# Patient Record
Sex: Female | Born: 1950 | Race: White | Hispanic: No | Marital: Single | Smoking: Never smoker
Health system: Southern US, Community
[De-identification: ages and names within clinical notes are randomized; demographics above are authoritative.]

## PROBLEM LIST (undated history)

## (undated) DIAGNOSIS — F0280 Dementia in other diseases classified elsewhere without behavioral disturbance: Secondary | ICD-10-CM

## (undated) DIAGNOSIS — D649 Anemia, unspecified: Secondary | ICD-10-CM

## (undated) DIAGNOSIS — E7800 Pure hypercholesterolemia, unspecified: Secondary | ICD-10-CM

## (undated) DIAGNOSIS — G5603 Carpal tunnel syndrome, bilateral upper limbs: Secondary | ICD-10-CM

## (undated) DIAGNOSIS — F329 Major depressive disorder, single episode, unspecified: Secondary | ICD-10-CM

## (undated) DIAGNOSIS — K760 Fatty (change of) liver, not elsewhere classified: Secondary | ICD-10-CM

## (undated) DIAGNOSIS — F32A Depression, unspecified: Secondary | ICD-10-CM

## (undated) DIAGNOSIS — K219 Gastro-esophageal reflux disease without esophagitis: Secondary | ICD-10-CM

## (undated) DIAGNOSIS — F209 Schizophrenia, unspecified: Secondary | ICD-10-CM

## (undated) DIAGNOSIS — G309 Alzheimer's disease, unspecified: Secondary | ICD-10-CM

## (undated) DIAGNOSIS — Z8719 Personal history of other diseases of the digestive system: Secondary | ICD-10-CM

## (undated) HISTORY — PX: MANDIBLE SURGERY: SHX707

## (undated) HISTORY — PX: TUMOR REMOVAL: SHX12

## (undated) HISTORY — PX: TONSILLECTOMY: SUR1361

## (undated) HISTORY — PX: CARPAL TUNNEL RELEASE: SHX101

## (undated) HISTORY — PX: COLONOSCOPY: SHX174

## (undated) HISTORY — PX: ABDOMINAL HYSTERECTOMY: SHX81

## (undated) NOTE — *Deleted (*Deleted)
ED CM met with patient at [facility] bed -20 to discuss transitional care recommendations. HH with DME w/c and bedside commode. Patient states her daughter-in-law will be able to come to her home and care for her declined home health services, but agrees to the equipment. CM ordered equipment will be delivered to the ED and patient will be discharged and transported home with son in private vehicle

---

## 2009-07-05 ENCOUNTER — Emergency Department (HOSPITAL_COMMUNITY): Admission: EM | Admit: 2009-07-05 | Discharge: 2009-07-05 | Payer: Self-pay | Admitting: Emergency Medicine

## 2010-01-21 ENCOUNTER — Inpatient Hospital Stay (HOSPITAL_COMMUNITY): Admission: EM | Admit: 2010-01-21 | Discharge: 2010-01-23 | Payer: Self-pay | Admitting: Emergency Medicine

## 2011-02-02 LAB — COMPREHENSIVE METABOLIC PANEL
AST: 16 U/L (ref 0–37)
AST: 17 U/L (ref 0–37)
Albumin: 2.9 g/dL — ABNORMAL LOW (ref 3.5–5.2)
Alkaline Phosphatase: 88 U/L (ref 39–117)
BUN: 10 mg/dL (ref 6–23)
BUN: 11 mg/dL (ref 6–23)
CO2: 23 mEq/L (ref 19–32)
Chloride: 103 mEq/L (ref 96–112)
Chloride: 107 mEq/L (ref 96–112)
Creatinine, Ser: 0.69 mg/dL (ref 0.4–1.2)
Creatinine, Ser: 0.9 mg/dL (ref 0.4–1.2)
GFR calc non Af Amer: >60 mL/min (ref >60)
Glucose, Bld: 160 mg/dL — ABNORMAL HIGH (ref 70–99)
Potassium: 3.5 mEq/L (ref 3.5–5.1)
Total Bilirubin: 1 mg/dL (ref 0.3–1.2)
Total Protein: 5.3 g/dL — ABNORMAL LOW (ref 6.0–8.3)
Total Protein: 7 g/dL (ref 6.0–8.3)

## 2011-02-02 LAB — CBC
HCT: 34.8 % — ABNORMAL LOW (ref 36.0–46.0)
HCT: 43 % (ref 36.0–46.0)
Hemoglobin: 11.3 g/dL — ABNORMAL LOW (ref 12.0–15.0)
MCHC: 33.2 g/dL (ref 30.0–36.0)
MCHC: 34 g/dL (ref 30.0–36.0)
MCHC: 34.1 g/dL (ref 30.0–36.0)
MCV: 92.2 fL (ref 78.0–100.0)
MCV: 92.3 fL (ref 78.0–100.0)
MCV: 94.1 fL (ref 78.0–100.0)
Platelets: 238 10*3/uL (ref 150–400)
RBC: 3.7 MIL/uL — ABNORMAL LOW (ref 3.87–5.11)
RBC: 3.76 MIL/uL — ABNORMAL LOW (ref 3.87–5.11)
RBC: 3.96 MIL/uL (ref 3.87–5.11)
RDW: 11.7 % (ref 11.5–15.5)
RDW: 12.7 % (ref 11.5–15.5)
WBC: 5.1 10*3/uL (ref 4.0–10.5)
WBC: 6.1 10*3/uL (ref 4.0–10.5)

## 2011-02-02 LAB — BASIC METABOLIC PANEL
BUN: 6 mg/dL (ref 6–23)
BUN: 7 mg/dL (ref 6–23)
Calcium: 8.6 mg/dL (ref 8.4–10.5)
Chloride: 109 mEq/L (ref 96–112)
Chloride: 112 mEq/L (ref 96–112)
Creatinine, Ser: 0.66 mg/dL (ref 0.4–1.2)
GFR calc Af Amer: >60 mL/min (ref >60)
GFR calc non Af Amer: >60 mL/min (ref >60)
Potassium: 3.8 mEq/L (ref 3.5–5.1)
Sodium: 141 mEq/L (ref 135–145)

## 2011-02-02 LAB — BLOOD GAS, ARTERIAL
Acid-Base Excess: 0.4 mmol/L (ref 0.0–2.0)
Bicarbonate: 24.5 mEq/L — ABNORMAL HIGH (ref 20.0–24.0)
O2 Saturation: 92.7 %
Patient temperature: 98.6
pH, Arterial: 7.407 — ABNORMAL HIGH (ref 7.350–7.400)
pO2, Arterial: 61.6 mmHg — ABNORMAL LOW (ref 80.0–100.0)

## 2011-02-02 LAB — DIFFERENTIAL
Basophils Absolute: 0 10*3/uL (ref 0.0–0.1)
Eosinophils Absolute: 0 10*3/uL (ref 0.0–0.7)
Eosinophils Absolute: 0 10*3/uL (ref 0.0–0.7)
Eosinophils Absolute: 0.1 10*3/uL (ref 0.0–0.7)
Eosinophils Relative: 0 % (ref 0–5)
Eosinophils Relative: 1 % (ref 0–5)
Eosinophils Relative: 1 % (ref 0–5)
Lymphocytes Relative: 16 % (ref 12–46)
Lymphocytes Relative: 20 % (ref 12–46)
Lymphs Abs: 0.7 10*3/uL (ref 0.7–4.0)
Lymphs Abs: 1.2 10*3/uL (ref 0.7–4.0)
Monocytes Absolute: 0.5 10*3/uL (ref 0.1–1.0)
Monocytes Relative: 4 % (ref 3–12)
Monocytes Relative: 7 % (ref 3–12)
Monocytes Relative: 9 % (ref 3–12)
Neutro Abs: 5.3 10*3/uL (ref 1.7–7.7)

## 2011-02-02 LAB — CULTURE, BLOOD (ROUTINE X 2): Culture: NO GROWTH

## 2011-02-02 LAB — CARDIAC PANEL(CRET KIN+CKTOT+MB+TROPI)
CK, MB: 0.8 ng/mL (ref 0.3–4.0)
Relative Index: INVALID (ref 0.0–2.5)
Total CK: 30 U/L (ref 7–177)
Troponin I: 0.04 ng/mL (ref 0.00–0.06)

## 2011-02-02 LAB — POCT I-STAT, CHEM 8
BUN: 12 mg/dL (ref 6–23)
Chloride: 102 mEq/L (ref 96–112)
Creatinine, Ser: 0.8 mg/dL (ref 0.4–1.2)
Potassium: 3.6 mEq/L (ref 3.5–5.1)
Sodium: 139 mEq/L (ref 135–145)
TCO2: 28 mmol/L (ref 0–100)

## 2011-02-02 LAB — URINALYSIS, ROUTINE W REFLEX MICROSCOPIC
Glucose, UA: NEGATIVE mg/dL
Hgb urine dipstick: NEGATIVE
Ketones, ur: NEGATIVE mg/dL
Protein, ur: NEGATIVE mg/dL
pH: 5 (ref 5.0–8.0)

## 2011-02-02 LAB — URINE CULTURE

## 2011-02-02 LAB — LIPID PANEL
HDL: 41 mg/dL (ref >39)
LDL Cholesterol: 73 mg/dL (ref 0–99)
Triglycerides: 96 mg/dL (ref <150)
VLDL: 19 mg/dL (ref 0–40)

## 2011-02-02 LAB — SEDIMENTATION RATE: Sed Rate: 39 mm/hr — ABNORMAL HIGH (ref 0–22)

## 2011-02-02 LAB — LACTIC ACID, PLASMA: Lactic Acid, Venous: 2 mmol/L (ref 0.5–2.2)

## 2011-02-02 LAB — RPR: RPR Ser Ql: NONREACTIVE

## 2011-02-02 LAB — TSH: TSH: 0.509 u[IU]/mL (ref 0.350–4.500)

## 2011-02-28 ENCOUNTER — Emergency Department (HOSPITAL_COMMUNITY)
Admission: EM | Admit: 2011-02-28 | Discharge: 2011-03-01 | Disposition: A | Discharge status: Home / Self Care | Payer: PRIVATE HEALTH INSURANCE | Attending: Emergency Medicine | Admitting: Emergency Medicine

## 2011-02-28 DIAGNOSIS — F068 Other specified mental disorders due to known physiological condition: Secondary | ICD-10-CM | POA: Insufficient documentation

## 2011-02-28 DIAGNOSIS — R6884 Jaw pain: Secondary | ICD-10-CM | POA: Insufficient documentation

## 2011-03-01 ENCOUNTER — Emergency Department (HOSPITAL_COMMUNITY): Payer: PRIVATE HEALTH INSURANCE

## 2011-03-10 ENCOUNTER — Other Ambulatory Visit: Payer: Self-pay | Admitting: Oral Surgery

## 2011-03-10 DIAGNOSIS — R6884 Jaw pain: Secondary | ICD-10-CM

## 2011-03-11 ENCOUNTER — Ambulatory Visit
Admission: RE | Admit: 2011-03-11 | Discharge: 2011-03-11 | Disposition: A | Discharge status: Home / Self Care | Payer: PRIVATE HEALTH INSURANCE | Source: Ambulatory Visit | Attending: Oral Surgery | Admitting: Oral Surgery

## 2011-03-11 DIAGNOSIS — R6884 Jaw pain: Secondary | ICD-10-CM

## 2011-06-03 ENCOUNTER — Other Ambulatory Visit (HOSPITAL_COMMUNITY): Payer: PRIVATE HEALTH INSURANCE

## 2011-06-04 ENCOUNTER — Encounter (HOSPITAL_COMMUNITY)
Admission: RE | Admit: 2011-06-04 | Discharge: 2011-06-04 | Disposition: A | Discharge status: Home / Self Care | Payer: PRIVATE HEALTH INSURANCE | Source: Ambulatory Visit | Attending: Oral Surgery | Admitting: Oral Surgery

## 2011-06-04 LAB — CBC
Hemoglobin: 14.9 g/dL (ref 12.0–15.0)
Platelets: 205 10*3/uL (ref 150–400)
RBC: 4.79 MIL/uL (ref 3.87–5.11)
WBC: 5 10*3/uL (ref 4.0–10.5)

## 2011-06-04 LAB — DIFFERENTIAL
Basophils Relative: 0 % (ref 0–1)
Eosinophils Absolute: 0.1 10*3/uL (ref 0.0–0.7)
Lymphs Abs: 1.9 10*3/uL (ref 0.7–4.0)
Monocytes Relative: 9 % (ref 3–12)
Neutro Abs: 2.6 10*3/uL (ref 1.7–7.7)
Neutrophils Relative %: 51 % (ref 43–77)

## 2011-06-04 LAB — SURGICAL PCR SCREEN: MRSA, PCR: POSITIVE — AB

## 2011-06-05 LAB — BASIC METABOLIC PANEL
Chloride: 106 mEq/L (ref 96–112)
GFR calc Af Amer: >60 mL/min (ref >60)
GFR calc non Af Amer: >60 mL/min (ref >60)
Potassium: 4.4 mEq/L (ref 3.5–5.1)
Sodium: 141 mEq/L (ref 135–145)

## 2011-06-09 ENCOUNTER — Ambulatory Visit (HOSPITAL_COMMUNITY)
Admission: RE | Admit: 2011-06-09 | Discharge: 2011-06-09 | Disposition: A | Discharge status: Home / Self Care | Payer: PRIVATE HEALTH INSURANCE | Source: Ambulatory Visit | Attending: Oral Surgery | Admitting: Oral Surgery

## 2011-06-09 DIAGNOSIS — Z79899 Other long term (current) drug therapy: Secondary | ICD-10-CM | POA: Insufficient documentation

## 2011-06-09 DIAGNOSIS — Z01812 Encounter for preprocedural laboratory examination: Secondary | ICD-10-CM | POA: Insufficient documentation

## 2011-06-09 DIAGNOSIS — M26639 Articular disc disorder of temporomandibular joint, unspecified side: Secondary | ICD-10-CM | POA: Insufficient documentation

## 2011-06-10 NOTE — Op Note (Signed)
[name], [name] NO.:  [id number]  MEDICAL RECORD NO.:  [id number]  LOCATION:  [facility]                         FACILITY:  [facility]  PHYSICIAN:  Georgia Lopes, M.D.  DATE OF BIRTH:  Aug 25, 1951  DATE OF PROCEDURE:  06/09/2011 DATE OF DISCHARGE:                              OPERATIVE REPORT   PREOPERATIVE DIAGNOSES: 1. Right temporomandibular joint degenerative joint disease. 2. Anterior disk displacement.  POSTOPERATIVE DIAGNOSES: 1. Right temporomandibular joint degenerative joint disease. 2. Anterior disk displacement.  PROCEDURE:  Right TMJ arthrotomy, meniscectomy, arthroplasty.  SURGEON:  Georgia Lopes, M.D.  ANESTHESIA:  General, Dr. Noreene Larsson attending, nasal intubation.  ASSISTANTS:  Nixon and Simlar.  INDICATIONS FOR PROCEDURE:  The patient is a 60 year old female who is known to my practice for many years, who develop TMJ pain on the right side approximately February of this year.  She was treated conservatively with anti-inflammatory, soft diet, ice and warm compresses, but did not have any relief.  An MRI was obtained which demonstrated severe arthropathy of the right TMJ with the degenerated meniscus.  Because of the continuing pain symptoms and MRI-defined pathology, it was recommended that meniscectomy and arthroplasty be performed with general anesthesia.  PROCEDURE IN DETAIL:  The patient was taken to the operating room, placed on the table in supine position.  General anesthesia was administered intravenously and a nasal endotracheal tube was placed. The tube was secured, the eyes were protected.  The table was turned, then the temporal area was shaved, and then the patient was prepped and draped for the procedure.  Sterile marking pen was used to demarcate the line of the preauricular incision anterior to the tragus and curving anteriorly at the superiormost side.  Then, local anesthesia 2% lidocaine with 1:100,000 epinephrine was  infiltrated, total of 8 mL on the line of proposed incision and into the joint capsule itself.  Then, a 15 blade was used to make the incision along the previously demarcated incision line.  This incision was carried down through skin and subcutaneous tissue.  The initial dissection was carried out superiorly with hemostat going down to the superficial layer of deep temporal fascia.  This was used to the plane of dissection throughout the entire length of incision.  Bleeding vessels were cauterized with the Bovie electrocautery.  The dissection was carried anteriorly over the zygomatic arch and then bluntly over the joint capsule.  Then, the joint was entered into with a 15 blade.  Freer elevator was used to dissect in the joint.  There was no identifiable disk noted over the condylar head and the condylar head was severely degenerated with irregular surface and erosion to the cortical bone.  There was some discal tissue noted medially which was removed with a pituitary rongeur.  Other anterior and posterior discal fragments were removed with a beaver blade.  Then, the jaw clamp was placed on the inferior aspect of the angle of the mandible to allow for inferior distraction of the jaw and then alveoplasty was performed using the small round bur under irrigation and a bone file. The jaw was then opened and closed and found to track smoothly and have good opening and  closing.  The area was then irrigated and the deep layer was closed with 4-0 Vicryl and then the subcuticular with 4-0 chromic and skin was closed with 5-0 Prolene.  Bacitracin was applied to the area.  Telfa, fluffs, Kerlix, and Ace wrap were placed.  Bacitracin was placed over the angle of the mandible where the jaw clamp had been placed.  The patient was awakened in the operating room, taken to the recovery room breathing spontaneously in good condition.  ESTIMATED BLOOD LOSS:  100 mL.  SPECIMEN:  Fragments of right TMJ  disk.  COMPLICATIONS:  None.     Georgia Lopes, M.D.     SMJ/MEDQ  D:  06/09/2011  T:  06/09/2011  Job:  [id number]  Electronically Signed by Ocie Doyne M.D. on 06/10/2011 10:52:36 AM

## 2011-08-22 ENCOUNTER — Emergency Department (INDEPENDENT_AMBULATORY_CARE_PROVIDER_SITE_OTHER): Payer: PRIVATE HEALTH INSURANCE

## 2011-08-22 ENCOUNTER — Encounter: Payer: Self-pay | Admitting: *Deleted

## 2011-08-22 ENCOUNTER — Emergency Department (HOSPITAL_BASED_OUTPATIENT_CLINIC_OR_DEPARTMENT_OTHER): Payer: PRIVATE HEALTH INSURANCE

## 2011-08-22 ENCOUNTER — Emergency Department (HOSPITAL_BASED_OUTPATIENT_CLINIC_OR_DEPARTMENT_OTHER)
Admission: EM | Admit: 2011-08-22 | Discharge: 2011-08-22 | Disposition: A | Discharge status: Home / Self Care | Payer: PRIVATE HEALTH INSURANCE | Attending: Emergency Medicine | Admitting: Emergency Medicine

## 2011-08-22 DIAGNOSIS — R0781 Pleurodynia: Secondary | ICD-10-CM

## 2011-08-22 DIAGNOSIS — R079 Chest pain, unspecified: Secondary | ICD-10-CM | POA: Insufficient documentation

## 2011-08-22 DIAGNOSIS — R0789 Other chest pain: Secondary | ICD-10-CM

## 2011-08-22 DIAGNOSIS — G309 Alzheimer's disease, unspecified: Secondary | ICD-10-CM | POA: Insufficient documentation

## 2011-08-22 DIAGNOSIS — W06XXXA Fall from bed, initial encounter: Secondary | ICD-10-CM | POA: Insufficient documentation

## 2011-08-22 DIAGNOSIS — F0280 Dementia in other diseases classified elsewhere without behavioral disturbance: Secondary | ICD-10-CM | POA: Insufficient documentation

## 2011-08-22 DIAGNOSIS — Z8739 Personal history of other diseases of the musculoskeletal system and connective tissue: Secondary | ICD-10-CM | POA: Insufficient documentation

## 2011-08-22 DIAGNOSIS — W19XXXA Unspecified fall, initial encounter: Secondary | ICD-10-CM

## 2011-08-22 DIAGNOSIS — Z79899 Other long term (current) drug therapy: Secondary | ICD-10-CM | POA: Insufficient documentation

## 2011-08-22 DIAGNOSIS — Y92009 Unspecified place in unspecified non-institutional (private) residence as the place of occurrence of the external cause: Secondary | ICD-10-CM | POA: Insufficient documentation

## 2011-08-22 DIAGNOSIS — E7800 Pure hypercholesterolemia, unspecified: Secondary | ICD-10-CM | POA: Insufficient documentation

## 2011-08-22 HISTORY — DX: Dementia in other diseases classified elsewhere, unspecified severity, without behavioral disturbance, psychotic disturbance, mood disturbance, and anxiety: F02.80

## 2011-08-22 HISTORY — DX: Pure hypercholesterolemia, unspecified: E78.00

## 2011-08-22 HISTORY — DX: Major depressive disorder, single episode, unspecified: F32.9

## 2011-08-22 HISTORY — DX: Depression, unspecified: F32.A

## 2011-08-22 HISTORY — DX: Schizophrenia, unspecified: F20.9

## 2011-08-22 HISTORY — DX: Alzheimer's disease, unspecified: G30.9

## 2011-08-22 NOTE — ED Provider Notes (Signed)
Medical screening examination/treatment/procedure(s) were performed by non-physician practitioner and as supervising physician I was immediately available for consultation/collaboration.   Arcadia Gorgas A Enis Riecke, MD 08/22/11 1927 

## 2011-08-22 NOTE — ED Notes (Signed)
Abrasion to center of Chest after fall

## 2011-08-22 NOTE — ED Notes (Signed)
Pt states she "falls a lot" Denies dizziness or other S/S. Hx Alzheimers. States last night she believes the "dogs pushed her out of the bed and she hit the night stand." Now has an abrasion and bruising to the area.

## 2011-08-22 NOTE — ED Notes (Signed)
Pt ambulatory without distress family supportive at side

## 2011-08-22 NOTE — ED Notes (Signed)
Pt reports fall to chest this AM denies any other injuries

## 2011-08-22 NOTE — ED Notes (Signed)
Abrasion to center of Chest

## 2011-08-22 NOTE — ED Provider Notes (Signed)
History     CSN: [id number] Arrival date & time: 08/22/2011  1:30 PM  Chief Complaint  Patient presents with  . Fall    (Consider location/radiation/quality/duration/timing/severity/associated sxs/prior treatment) HPI Comments: Pt states that she fell getting out of the bed:pt states that she fell getting out of WUJ:WJXBJY states that there was not loc:pt states that she hurts in the left ribs  Patient is a 60 y.o. female presenting with fall. The history is provided by the patient and a relative. No language interpreter was used.  Fall    Past Medical History  Diagnosis Date  . Alzheimer's dementia   . Hypercholesteremia   . Depression   . Schizophrenia   . Arthritis     Past Surgical History  Procedure Date  . Abdominal hysterectomy     History reviewed. No pertinent family history.  History  Substance Use Topics  . Smoking status: Never Smoker   . Smokeless tobacco: Not on file  . Alcohol Use: No    OB History    Grav Para Term Preterm Abortions TAB SAB Ect Mult Living                  Review of Systems  Unable to perform ROS: Dementia    Allergies  Review of patient's allergies indicates no known allergies.  Home Medications   Current Outpatient Rx  Name Route Sig Dispense Refill  . ASPIRIN 81 MG PO CHEW Oral Chew 81 mg by mouth daily.      Marland Kitchen BENZTROPINE MESYLATE 1 MG PO TABS Oral Take 1 mg by mouth daily.      . BUPROPION HCL ER (XL) 300 MG PO TB24 Oral Take 300 mg by mouth daily.      Marland Kitchen CITALOPRAM HYDROBROMIDE 40 MG PO TABS Oral Take 40 mg by mouth daily.      Marland Kitchen CLONAZEPAM 0.5 MG PO TABS Oral Take 0.5 mg by mouth 2 (two) times daily as needed.      . OMEGA-3 FATTY ACIDS 1000 MG PO CAPS Oral Take 2 g by mouth daily.      Marland Kitchen MEMANTINE HCL 10 MG PO TABS Oral Take 10 mg by mouth daily.      . NYSTATIN-TRIAMCINOLONE 100000-0.1 UNIT/GM-% EX OINT Topical Apply topically 2 (two) times daily.      Marland Kitchen OMEPRAZOLE 40 MG PO CPDR Oral Take 40 mg by mouth  daily.      . QUETIAPINE FUMARATE 300 MG PO TB24 Oral Take 600 mg by mouth at bedtime.      Marland Kitchen SIMVASTATIN 80 MG PO TABS Oral Take 80 mg by mouth at bedtime.      Marland Kitchen ZOLPIDEM TARTRATE 5 MG PO TABS Oral Take 5 mg by mouth at bedtime as needed.        BP 106/71  Pulse 82  Temp(Src) 98 F (36.7 C) (Oral)  Resp 20  Ht 5\' 5"  (1.651 m)  Wt 180 lb (81.647 kg)  BMI 29.95 kg/m2  SpO2 97%  Physical Exam  Nursing note and vitals reviewed. Constitutional: She is oriented to person, place, and time. She appears well-developed and well-nourished.  HENT:  Head: Normocephalic and atraumatic.  Eyes: Conjunctivae are normal. Pupils are equal, round, and reactive to light.  Neck: Normal range of motion.  Cardiovascular: Normal rate and regular rhythm.   Pulmonary/Chest: Effort normal and breath sounds normal.       Pt tender to the left ribs  Abdominal: Soft.  Musculoskeletal: Normal range  of motion.  Neurological: She is alert and oriented to person, place, and time.  Skin:       Pt has an abrasion to the sternum  Psychiatric: She has a normal mood and affect.    ED Course  Procedures (including critical care time)  Labs Reviewed - No data to display Dg Ribs Unilateral W/chest Left  08/22/2011  *RADIOLOGY REPORT*  Clinical Data: Fall out of bed, left anterior chest swelling  LEFT RIBS AND CHEST - 3+ VIEW  Comparison: CT chest dated 01/21/2010  Findings: Lungs are clear. No pleural effusion or pneumothorax.  Cardiomediastinal silhouette is within normal limits.  Bilateral breast prostheses.  No left rib fracture is seen.  IMPRESSION: No evidence of acute cardiopulmonary disease.  No left rib fracture is seen.  Original Report Authenticated By: Charline Bills, M.D.     1. Fall   2. Rib pain       MDM  No acute findings noted:pt okay to talk tylenol        Teressa Lower, NP 08/22/11 1447  Teressa Lower, NP 08/22/11 1448

## 2011-09-03 ENCOUNTER — Emergency Department (HOSPITAL_COMMUNITY)
Admission: EM | Admit: 2011-09-03 | Discharge: 2011-09-03 | Disposition: A | Discharge status: Home / Self Care | Payer: PRIVATE HEALTH INSURANCE | Attending: Emergency Medicine | Admitting: Emergency Medicine

## 2011-09-03 ENCOUNTER — Emergency Department (HOSPITAL_COMMUNITY): Payer: PRIVATE HEALTH INSURANCE

## 2011-09-03 DIAGNOSIS — W2203XA Walked into furniture, initial encounter: Secondary | ICD-10-CM | POA: Insufficient documentation

## 2011-09-03 DIAGNOSIS — Z79899 Other long term (current) drug therapy: Secondary | ICD-10-CM | POA: Insufficient documentation

## 2011-09-03 DIAGNOSIS — Z7982 Long term (current) use of aspirin: Secondary | ICD-10-CM | POA: Insufficient documentation

## 2011-09-03 DIAGNOSIS — S20219A Contusion of unspecified front wall of thorax, initial encounter: Secondary | ICD-10-CM | POA: Insufficient documentation

## 2011-09-03 DIAGNOSIS — R072 Precordial pain: Secondary | ICD-10-CM | POA: Insufficient documentation

## 2011-09-03 DIAGNOSIS — F0390 Unspecified dementia without behavioral disturbance: Secondary | ICD-10-CM | POA: Insufficient documentation

## 2011-11-11 ENCOUNTER — Encounter (HOSPITAL_COMMUNITY): Payer: Self-pay | Admitting: Emergency Medicine

## 2011-11-11 ENCOUNTER — Emergency Department (HOSPITAL_COMMUNITY)
Admission: EM | Admit: 2011-11-11 | Discharge: 2011-11-12 | Disposition: A | Discharge status: Home / Self Care | Payer: PRIVATE HEALTH INSURANCE | Attending: Emergency Medicine | Admitting: Emergency Medicine

## 2011-11-11 DIAGNOSIS — K529 Noninfective gastroenteritis and colitis, unspecified: Secondary | ICD-10-CM

## 2011-11-11 DIAGNOSIS — G309 Alzheimer's disease, unspecified: Secondary | ICD-10-CM | POA: Insufficient documentation

## 2011-11-11 DIAGNOSIS — R109 Unspecified abdominal pain: Secondary | ICD-10-CM | POA: Insufficient documentation

## 2011-11-11 DIAGNOSIS — K5289 Other specified noninfective gastroenteritis and colitis: Secondary | ICD-10-CM | POA: Insufficient documentation

## 2011-11-11 DIAGNOSIS — D279 Benign neoplasm of unspecified ovary: Secondary | ICD-10-CM | POA: Insufficient documentation

## 2011-11-11 DIAGNOSIS — F209 Schizophrenia, unspecified: Secondary | ICD-10-CM | POA: Insufficient documentation

## 2011-11-11 DIAGNOSIS — R112 Nausea with vomiting, unspecified: Secondary | ICD-10-CM | POA: Insufficient documentation

## 2011-11-11 DIAGNOSIS — F0280 Dementia in other diseases classified elsewhere without behavioral disturbance: Secondary | ICD-10-CM | POA: Insufficient documentation

## 2011-11-11 LAB — BASIC METABOLIC PANEL
BUN: 11 mg/dL (ref 6–23)
Calcium: 10 mg/dL (ref 8.4–10.5)
Creatinine, Ser: 0.76 mg/dL (ref 0.50–1.10)
GFR calc non Af Amer: 89 mL/min — ABNORMAL LOW (ref >90)
Glucose, Bld: 115 mg/dL — ABNORMAL HIGH (ref 70–99)

## 2011-11-11 LAB — CBC
Hemoglobin: 15.7 g/dL — ABNORMAL HIGH (ref 12.0–15.0)
MCH: 31.2 pg (ref 26.0–34.0)
MCHC: 34.2 g/dL (ref 30.0–36.0)
Platelets: 244 10*3/uL (ref 150–400)
RDW: 13 % (ref 11.5–15.5)

## 2011-11-11 LAB — DIFFERENTIAL
Basophils Relative: 0 % (ref 0–1)
Eosinophils Absolute: 0 10*3/uL (ref 0.0–0.7)
Monocytes Relative: 6 % (ref 3–12)
Neutrophils Relative %: 86 % — ABNORMAL HIGH (ref 43–77)

## 2011-11-11 NOTE — ED Notes (Signed)
PT. REPORTS MID ABDOMINAL PAIN WITH VOMITTING AND DIZZINESS ONSET THIS AFTERNOON AFTER COLOSCOPY THIS MORNING,  DENIES RECTAL BLEEDING , NO DIARRHEA.

## 2011-11-12 ENCOUNTER — Encounter (HOSPITAL_COMMUNITY): Payer: Self-pay | Admitting: Radiology

## 2011-11-12 ENCOUNTER — Emergency Department (HOSPITAL_COMMUNITY): Payer: PRIVATE HEALTH INSURANCE

## 2011-11-12 LAB — URINALYSIS, ROUTINE W REFLEX MICROSCOPIC
Hgb urine dipstick: NEGATIVE
Protein, ur: NEGATIVE mg/dL
Urobilinogen, UA: 1 mg/dL (ref 0.0–1.0)

## 2011-11-12 LAB — URINE MICROSCOPIC-ADD ON

## 2011-11-12 MED ORDER — IOHEXOL 300 MG/ML  SOLN
100.0000 mL | Freq: Once | INTRAMUSCULAR | Status: AC | PRN
  Administered 2011-11-12: 100 mL via INTRAVENOUS

## 2011-11-12 MED ORDER — METRONIDAZOLE IN NACL 5-0.79 MG/ML-% IV SOLN: 500.0000 mg | Freq: Once | INTRAVENOUS | Status: DC

## 2011-11-12 MED ORDER — CIPROFLOXACIN HCL 500 MG PO TABS: 500.0000 mg | ORAL_TABLET | Freq: Two times a day (BID) | ORAL | Status: DC

## 2011-11-12 MED ORDER — HYDROCODONE-ACETAMINOPHEN 5-500 MG PO TABS: 1.0000 | ORAL_TABLET | Freq: Four times a day (QID) | ORAL | Status: DC | PRN

## 2011-11-12 MED ORDER — PROMETHAZINE HCL 25 MG PO TABS: 25.0000 mg | ORAL_TABLET | Freq: Four times a day (QID) | ORAL | Status: DC | PRN

## 2011-11-12 MED ORDER — METRONIDAZOLE 500 MG PO TABS
500.0000 mg | ORAL_TABLET | Freq: Once | ORAL | Status: AC
  Administered 2011-11-12: 500 mg via ORAL
  Filled 2011-11-12: qty 1

## 2011-11-12 MED ORDER — HYDROMORPHONE HCL PF 1 MG/ML IJ SOLN
1.0000 mg | Freq: Once | INTRAMUSCULAR | Status: AC
  Administered 2011-11-12: 1 mg via INTRAVENOUS
  Filled 2011-11-12: qty 1

## 2011-11-12 MED ORDER — IOHEXOL 300 MG/ML  SOLN
40.0000 mL | Freq: Once | INTRAMUSCULAR | Status: AC | PRN
  Administered 2011-11-12: 40 mL via ORAL

## 2011-11-12 MED ORDER — METRONIDAZOLE 500 MG PO TABS: 500.0000 mg | ORAL_TABLET | Freq: Two times a day (BID) | ORAL | Status: DC

## 2011-11-12 MED ORDER — CIPROFLOXACIN IN D5W 400 MG/200ML IV SOLN
400.0000 mg | Freq: Once | INTRAVENOUS | Status: AC
  Administered 2011-11-12: 400 mg via INTRAVENOUS
  Filled 2011-11-12: qty 200

## 2011-11-12 MED ORDER — ONDANSETRON HCL 4 MG/2ML IJ SOLN
4.0000 mg | Freq: Once | INTRAMUSCULAR | Status: AC
  Administered 2011-11-12: 4 mg via INTRAVENOUS
  Filled 2011-11-12: qty 2

## 2011-11-12 MED ORDER — SODIUM CHLORIDE 0.9 % IV BOLUS (SEPSIS)
1000.0000 mL | Freq: Once | INTRAVENOUS | Status: AC
  Administered 2011-11-12: 1000 mL via INTRAVENOUS

## 2011-11-12 NOTE — ED Notes (Signed)
Left message on cell phone about rx

## 2011-11-12 NOTE — ED Provider Notes (Addendum)
History     CSN: [id number]  Arrival date & time 11/11/11  2218   First MD Initiated Contact with Patient 11/12/11 0117      Chief Complaint  Patient presents with  . Abdominal Pain    (Consider location/radiation/quality/duration/timing/severity/associated sxs/prior treatment) The history is provided by the patient.   patient had a colonoscopy earlier today. She's having no significant complaints after the procedure went home and later on in the evening developed severe diffuse abdominal pain mostly upper abdomen. She has had some nausea with vomiting. No rectal bleeding. She was told that she may have some cramping after this procedure she states this feels much worse in the pain is persistent and not going away. No fevers. No recent illness. No history of similar pain. She reports that she had 4 polyps removed but no other abnormalities noted. Her procedure was performed in [facility]. Pain severe. Nonradiating. No dysuria. No chest pain or shortness of breath. No history of gallbladder or pancreatic problems.  Past Medical History  Diagnosis Date  . Alzheimer's dementia   . Hypercholesteremia   . Depression   . Schizophrenia   . Arthritis     Past Surgical History  Procedure Date  . Abdominal hysterectomy     History reviewed. No pertinent family history.  History  Substance Use Topics  . Smoking status: Never Smoker   . Smokeless tobacco: Not on file  . Alcohol Use: No    OB History    Grav Para Term Preterm Abortions TAB SAB Ect Mult Living                  Review of Systems  Constitutional: Negative for fever and chills.  HENT: Negative for neck pain and neck stiffness.   Eyes: Negative for pain.  Respiratory: Negative for shortness of breath.   Cardiovascular: Negative for chest pain.  Gastrointestinal: Positive for nausea, vomiting, abdominal pain and abdominal distention. Negative for anal bleeding and rectal pain.  Genitourinary: Negative for dysuria.    Musculoskeletal: Negative for back pain.  Skin: Negative for rash.  Neurological: Negative for headaches.  All other systems reviewed and are negative.    Allergies  Review of patient's allergies indicates no known allergies.  Home Medications   Current Outpatient Rx  Name Route Sig Dispense Refill  . ASPIRIN EC 81 MG PO TBEC Oral Take 81 mg by mouth daily.      Marland Kitchen BENZTROPINE MESYLATE 1 MG PO TABS Oral Take 1 mg by mouth 2 (two) times daily.     Marland Kitchen BIOTIN PO Oral Take 1 tablet by mouth daily.      . BUPROPION HCL ER (XL) 300 MG PO TB24 Oral Take 300 mg by mouth daily.      Marland Kitchen CITALOPRAM HYDROBROMIDE 40 MG PO TABS Oral Take 40 mg by mouth daily.      Marland Kitchen CLONAZEPAM 0.5 MG PO TABS Oral Take 0.5 mg by mouth 2 (two) times daily as needed. For anxiety    . EVENING PRIMROSE OIL PO Oral Take 1 capsule by mouth 2 (two) times daily.      . OMEGA-3 FATTY ACIDS 1000 MG PO CAPS Oral Take 1 g by mouth 2 (two) times daily.     Marland Kitchen GALANTAMINE HYDROBROMIDE 8 MG PO TABS Oral Take 8 mg by mouth 2 (two) times daily.      . NYSTATIN-TRIAMCINOLONE 100000-0.1 UNIT/GM-% EX OINT Topical Apply topically 2 (two) times daily.      Marland Kitchen  OMEPRAZOLE 40 MG PO CPDR Oral Take 40 mg by mouth daily.      . QUETIAPINE FUMARATE ER 300 MG PO TB24 Oral Take 600 mg by mouth at bedtime.      Marland Kitchen SIMVASTATIN 80 MG PO TABS Oral Take 80 mg by mouth at bedtime.      Marland Kitchen ZOLPIDEM TARTRATE 5 MG PO TABS Oral Take 5 mg by mouth at bedtime as needed. For sleep      BP 95/53  Pulse 70  Temp(Src) 98.7 F (37.1 C) (Oral)  Resp 20  SpO2 94%  Physical Exam  Constitutional: She is oriented to person, place, and time. She appears well-developed and well-nourished.  HENT:  Head: Normocephalic and atraumatic.  Eyes: Conjunctivae and EOM are normal. Pupils are equal, round, and reactive to light.  Neck: Trachea normal. Neck supple. No thyromegaly present.  Cardiovascular: Normal rate, regular rhythm, S1 normal, S2 normal and normal pulses.      No systolic murmur is present   No diastolic murmur is present  Pulses:      Radial pulses are 2+ on the right side, and 2+ on the left side.  Pulmonary/Chest: Effort normal and breath sounds normal. She has no wheezes. She has no rhonchi. She has no rales. She exhibits no tenderness.  Abdominal: Soft. Normal appearance and bowel sounds are normal. There is no CVA tenderness and negative Murphy's sign.       Diffuse abdominal tenderness with mild distention and voluntary guarding.  Musculoskeletal:       BLE:s Calves nontender, no cords or erythema, negative Homans sign  Neurological: She is alert and oriented to person, place, and time. She has normal strength. No cranial nerve deficit or sensory deficit. GCS eye subscore is 4. GCS verbal subscore is 5. GCS motor subscore is 6.  Skin: Skin is warm and dry. No rash noted. She is not diaphoretic.  Psychiatric: Her speech is normal.       Cooperative and appropriate    ED Course  Procedures (including critical care time)  Labs Reviewed  BASIC METABOLIC PANEL - Abnormal; Notable for the following:    Glucose, Bld 115 (*)    GFR calc non Af Amer 89 (*)    All other components within normal limits  CBC - Abnormal; Notable for the following:    WBC 16.6 (*)    Hemoglobin 15.7 (*)    All other components within normal limits  DIFFERENTIAL - Abnormal; Notable for the following:    Neutrophils Relative 86 (*)    Neutro Abs 14.3 (*)    Lymphocytes Relative 8 (*)    All other components within normal limits  URINALYSIS, ROUTINE W REFLEX MICROSCOPIC - Abnormal; Notable for the following:    Color, Urine AMBER (*) BIOCHEMICALS MAY BE AFFECTED BY COLOR   APPearance CLOUDY (*)    Bilirubin Urine SMALL (*)    Leukocytes, UA MODERATE (*)    All other components within normal limits  URINE MICROSCOPIC-ADD ON   Ct Abdomen Pelvis W Contrast  11/12/2011  *RADIOLOGY REPORT*  Clinical Data: Abdominal pain with vomiting and dizziness which began  after colonoscopy this morning.  No rectal bleeding are diarrhea.  CT ABDOMEN AND PELVIS WITH CONTRAST  Technique:  Multidetector CT imaging of the abdomen and pelvis was performed following the standard protocol during bolus administration of intravenous contrast.  Contrast: 40mL OMNIPAQUE IOHEXOL 300 MG/ML IV SOLN, OMNIPAQUE IOHEXOL 300 MG/ML IV SOLN  Comparison: None.  Findings: Fibrosis or atelectasis in the lung bases.  Calcified granuloma in the right lung base.  Bilateral breast implants. Small esophageal hiatal hernia. There is a focal area of mild wall thickening and mesenteric infiltration in the splenic flexure of the colon which is nonspecific but might represent inflammatory process.  There is no free air or free fluid to suggest perforation of the colon is mostly decompressed.  The stomach is decompressed but there appears to be some focal wall thickening along the greater curvature of the stomach with suggestion of intramural gas collections.  This could represent gas within the wall or between thickened folds. Gastritis is not excluded.  Calcified granulomas in the liver and spleen.  The gallbladder, pancreas, adrenal glands, abdominal aorta, and retroperitoneal lymph nodes are unremarkable.  Small renal parenchymal cysts bilaterally.  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  No solid mass or hydronephrosis in the kidneys.   The small bowel are not distended.  Contrast material extends to the colon without evidence of obstruction.   No free fluid or free air in the abdomen.  Pelvis:  Scattered diverticula in the colon without inflammatory process.  The appendix is mildly increased in diameter but is air- filled consistent with normal appendix. The bladder wall is not thickened.  IMPRESSION: 1. There is a focal area of mild wall thickening and mesenteric infiltration in the splenic flexure of the colon which is nonspecific but might represent  inflammatory process.  There is no free air or free fluid to suggest perforation and the colon is mostly decompressed.  2. The stomach is decompressed but there appears to be some focal wall thickening along the greater curvature of the stomach with suggestion of intramural gas collections.  This could represent gas within the wall or between thickened folds. Gastritis is not excluded.  Small esophageal hiatal hernia.  Granulomatous changes.  Original Report Authenticated By: Marlon Pel, M.D.   Labs obtained and reviewed. CT scan obtained and reviewed. IV Dilaudid for pain control.  On recheck, Patient also having pain right upper abdomen. I recommended that she have an ultrasound to evaluate her gallbladder and patient declines. She states she would prefer to follow up with her primary care doctor and her gastroenterologist. I prescribed antibiotics for possible colitis and recommended close GI followup. Prior to patient getting prescriptions she left AMA.  I also recommended patient have further lab testing to evaluate her pancreas and liver enzymes she declined. He   MDM   Abdominal pain status post colonoscopy with CT scan obtained. No free air or evidence of perforation. Her area of inflammation that could be postprocedural versus inflammatory process, antibiotics were initiated the patient left prior to receiving prescriptions.        Sunnie Nielsen, MD 11/12/11 1610  Sunnie Nielsen, MD 11/12/11 [phone]

## 2011-11-12 NOTE — ED Notes (Signed)
Pt and family not at bedside; IV on bed

## 2011-11-12 NOTE — ED Notes (Signed)
Pt left without papers

## 2011-11-17 ENCOUNTER — Encounter (HOSPITAL_COMMUNITY): Payer: Self-pay

## 2011-11-17 ENCOUNTER — Emergency Department (HOSPITAL_COMMUNITY)
Admission: EM | Admit: 2011-11-17 | Discharge: 2011-11-17 | Disposition: A | Discharge status: Home / Self Care | Payer: PRIVATE HEALTH INSURANCE | Attending: Emergency Medicine | Admitting: Emergency Medicine

## 2011-11-17 ENCOUNTER — Emergency Department (HOSPITAL_COMMUNITY): Payer: PRIVATE HEALTH INSURANCE

## 2011-11-17 DIAGNOSIS — R109 Unspecified abdominal pain: Secondary | ICD-10-CM | POA: Insufficient documentation

## 2011-11-17 DIAGNOSIS — E7800 Pure hypercholesterolemia, unspecified: Secondary | ICD-10-CM | POA: Insufficient documentation

## 2011-11-17 DIAGNOSIS — G309 Alzheimer's disease, unspecified: Secondary | ICD-10-CM | POA: Insufficient documentation

## 2011-11-17 DIAGNOSIS — F0280 Dementia in other diseases classified elsewhere without behavioral disturbance: Secondary | ICD-10-CM | POA: Insufficient documentation

## 2011-11-17 DIAGNOSIS — R197 Diarrhea, unspecified: Secondary | ICD-10-CM | POA: Insufficient documentation

## 2011-11-17 LAB — DIFFERENTIAL
Lymphocytes Relative: 31 % (ref 12–46)
Monocytes Absolute: 0.4 10*3/uL (ref 0.1–1.0)
Monocytes Relative: 7 % (ref 3–12)
Neutro Abs: 3.3 10*3/uL (ref 1.7–7.7)

## 2011-11-17 LAB — CBC
HCT: 41.5 % (ref 36.0–46.0)
Hemoglobin: 14 g/dL (ref 12.0–15.0)
MCHC: 33.7 g/dL (ref 30.0–36.0)
WBC: 5.4 10*3/uL (ref 4.0–10.5)

## 2011-11-17 LAB — COMPREHENSIVE METABOLIC PANEL
BUN: 9 mg/dL (ref 6–23)
CO2: 25 mEq/L (ref 19–32)
Chloride: 106 mEq/L (ref 96–112)
Creatinine, Ser: 0.72 mg/dL (ref 0.50–1.10)
GFR calc non Af Amer: >90 mL/min (ref >90)
Glucose, Bld: 90 mg/dL (ref 70–99)
Total Bilirubin: 0.3 mg/dL (ref 0.3–1.2)

## 2011-11-17 LAB — LIPASE, BLOOD: Lipase: 33 U/L (ref 11–59)

## 2011-11-17 LAB — URINE MICROSCOPIC-ADD ON

## 2011-11-17 LAB — URINALYSIS, ROUTINE W REFLEX MICROSCOPIC
Ketones, ur: NEGATIVE mg/dL
Nitrite: NEGATIVE
Protein, ur: NEGATIVE mg/dL

## 2011-11-17 MED ORDER — SODIUM CHLORIDE 0.9 % IV SOLN
Freq: Once | INTRAVENOUS | Status: AC
  Administered 2011-11-17: 19:00:00 via INTRAVENOUS

## 2011-11-17 MED ORDER — DIPHENHYDRAMINE HCL 50 MG/ML IJ SOLN
INTRAMUSCULAR | Status: AC
  Administered 2011-11-17: 50 mg
  Filled 2011-11-17: qty 1

## 2011-11-17 MED ORDER — MORPHINE SULFATE 4 MG/ML IJ SOLN
4.0000 mg | Freq: Once | INTRAMUSCULAR | Status: AC
  Administered 2011-11-17: 4 mg via INTRAVENOUS
  Filled 2011-11-17: qty 1

## 2011-11-17 MED ORDER — IOHEXOL 300 MG/ML  SOLN
100.0000 mL | Freq: Once | INTRAMUSCULAR | Status: AC | PRN
  Administered 2011-11-17: 100 mL via INTRAVENOUS

## 2011-11-17 MED ORDER — OXYCODONE-ACETAMINOPHEN 5-325 MG PO TABS: 1.0000 | ORAL_TABLET | ORAL | Status: AC | PRN

## 2011-11-17 MED ORDER — ONDANSETRON HCL 4 MG/2ML IJ SOLN
4.0000 mg | Freq: Once | INTRAMUSCULAR | Status: AC
  Administered 2011-11-17: 4 mg via INTRAVENOUS
  Filled 2011-11-17: qty 2

## 2011-11-17 NOTE — ED Notes (Signed)
Abdominal cramping, loose stools, began after her colonoscopy on the 2nd of January,    WAs placed on antibiotics and was informed the pain would go away, her abdominal cramping has not changed

## 2011-11-17 NOTE — ED Provider Notes (Signed)
History     CSN: [id number]  Arrival date & time 11/17/11  1415   First MD Initiated Contact with Patient 11/17/11 1801      Chief Complaint  Patient presents with  . Abdominal Pain    (Consider location/radiation/quality/duration/timing/severity/associated sxs/prior treatment) Patient is a 61 y.o. female presenting with abdominal pain. The history is provided by the patient.  Abdominal Pain The primary symptoms of the illness include abdominal pain.  She had a colonoscopy on January 2 at which time several polyps were removed. She was told that she should have some cramping which would improve. The cramping did not get better, so she came to the emergency department and was told that she had a high WBC and some inflammation which probably represented infection was sent home with antibiotics. Since then, the pain has not improved. She states the pain is primarily across the lower abdomen and does not radiate. It is severe with pain being 7/10 currently and 10 out of 10 at its worst. Pain is worse when she stands and walks. Nothing makes it any better. She has had some diarrhea but pain is not affected by bowel movement. Appetite has been diminished but pain is unaffected by eating. She had a low-grade fever 2 days ago and thinks it was as high as 101. She's not had any chills or sweats. She denies any urinary urgency, frequency, tenesmus, or dysuria. Pain is crampy in nature.  Past Medical History  Diagnosis Date  . Alzheimer's dementia   . Hypercholesteremia   . Depression   . Schizophrenia   . Arthritis     Past Surgical History  Procedure Date  . Abdominal hysterectomy     No family history on file.  History  Substance Use Topics  . Smoking status: Never Smoker   . Smokeless tobacco: Not on file  . Alcohol Use: No    OB History    Grav Para Term Preterm Abortions TAB SAB Ect Mult Living                  Review of Systems  Gastrointestinal: Positive for abdominal  pain.  All other systems reviewed and are negative.    Allergies  Review of patient's allergies indicates no known allergies.  Home Medications   Current Outpatient Rx  Name Route Sig Dispense Refill  . ASPIRIN EC 81 MG PO TBEC Oral Take 81 mg by mouth every morning.     Marland Kitchen BENZTROPINE MESYLATE 1 MG PO TABS Oral Take 1 mg by mouth 2 (two) times daily.     Marland Kitchen BIOTIN PO Oral Take 1 tablet by mouth every morning.     Marland Kitchen BUPROPION HCL ER (XL) 300 MG PO TB24 Oral Take 300 mg by mouth every morning.     Marland Kitchen CIPROFLOXACIN HCL 500 MG PO TABS Oral Take 500 mg by mouth 2 (two) times daily. For 7 days - started 1/4     . CITALOPRAM HYDROBROMIDE 40 MG PO TABS Oral Take 40 mg by mouth every morning.     Marland Kitchen CLONAZEPAM 0.5 MG PO TABS Oral Take 0.5 mg by mouth 2 (two) times daily.      Marland Kitchen EVENING PRIMROSE OIL PO Oral Take 1 capsule by mouth 2 (two) times daily.     . OMEGA-3 FATTY ACIDS 1000 MG PO CAPS Oral Take 1 g by mouth 2 (two) times daily.     Marland Kitchen GALANTAMINE HYDROBROMIDE 8 MG PO TABS Oral Take 8 mg by mouth  2 (two) times daily.     Marland Kitchen METRONIDAZOLE 500 MG PO TABS Oral Take 500 mg by mouth 2 (two) times daily. For 7 days - started 1/4     . NYSTATIN-TRIAMCINOLONE 100000-0.1 UNIT/GM-% EX OINT Topical Apply 1 application topically 3 (three) times daily as needed. For rash on arm    . OMEPRAZOLE 40 MG PO CPDR Oral Take 40 mg by mouth every morning.     Marland Kitchen PROMETHAZINE HCL 25 MG PO TABS Oral Take 25 mg by mouth every 6 (six) hours as needed. For nausea     . QUETIAPINE FUMARATE ER 300 MG PO TB24 Oral Take 600 mg by mouth at bedtime.     Marland Kitchen SIMVASTATIN 80 MG PO TABS Oral Take 80 mg by mouth at bedtime.     Marland Kitchen ZOLPIDEM TARTRATE 5 MG PO TABS Oral Take 5 mg by mouth at bedtime. For sleep      BP 125/85  Pulse 83  Temp(Src) 99 F (37.2 C) (Oral)  Resp 19  Ht 5\' 5"  (1.651 m)  Wt 190 lb (86.183 kg)  BMI 31.62 kg/m2  SpO2 99%  Physical Exam  Nursing note and vitals reviewed.  61 year old female who is  resting comfortably and in no acute distress. Vital signs are normal. Oxygen saturation is 97% which is normal. Head is normocephalic and atraumatic. PERRLA, EOMI. There is no scleral icterus. Mucous members are moist. Oropharynx is clear. Neck is supple without adenopathy or JVD. Back is nontender there's no CVA tenderness. Lungs are clear without rales, wheezes, rhonchi. Heart has regular rate and rhythm without murmur. Abdomen is soft, flat, with mild tenderness in the right lower quadrant and moderate tenderness in the suprapubic area. There is no rebound or guarding. Peristalsis is diminished. Extremities have no cyanosis or edema, full range of motion present. Skin is warm and moist without rash. Neurologic: Mental status is normal, cranial nerves are intact, there no focal motor or sensory deficits. Psychiatric: No abnormalities of mood or affect.  ED Course  Procedures (including critical care time)   Labs Reviewed  CBC  DIFFERENTIAL  COMPREHENSIVE METABOLIC PANEL  LIPASE, BLOOD  URINALYSIS, ROUTINE W REFLEX MICROSCOPIC   No results found.  Results for orders placed during the hospital encounter of 11/17/11  CBC      Component Value Range   WBC 5.4  4.0 - 10.5 (K/uL)   RBC 4.57  3.87 - 5.11 (MIL/uL)   Hemoglobin 14.0  12.0 - 15.0 (g/dL)   HCT 69.6  29.5 - 28.4 (%)   MCV 90.8  78.0 - 100.0 (fL)   MCH 30.6  26.0 - 34.0 (pg)   MCHC 33.7  30.0 - 36.0 (g/dL)   RDW 13.2  44.0 - 10.2 (%)   Platelets 206  150 - 400 (K/uL)  DIFFERENTIAL      Component Value Range   Neutrophils Relative 61  43 - 77 (%)   Neutro Abs 3.3  1.7 - 7.7 (K/uL)   Lymphocytes Relative 31  12 - 46 (%)   Lymphs Abs 1.7  0.7 - 4.0 (K/uL)   Monocytes Relative 7  3 - 12 (%)   Monocytes Absolute 0.4  0.1 - 1.0 (K/uL)   Eosinophils Relative 1  0 - 5 (%)   Eosinophils Absolute 0.1  0.0 - 0.7 (K/uL)   Basophils Relative 0  0 - 1 (%)   Basophils Absolute 0.0  0.0 - 0.1 (K/uL)  COMPREHENSIVE METABOLIC PANEL  Component Value Range   Sodium 140  135 - 145 (mEq/L)   Potassium 4.0  3.5 - 5.1 (mEq/L)   Chloride 106  96 - 112 (mEq/L)   CO2 25  19 - 32 (mEq/L)   Glucose, Bld 90  70 - 99 (mg/dL)   BUN 9  6 - 23 (mg/dL)   Creatinine, Ser 1.61  0.50 - 1.10 (mg/dL)   Calcium 9.4  8.4 - 09.6 (mg/dL)   Total Protein 6.7  6.0 - 8.3 (g/dL)   Albumin 3.6  3.5 - 5.2 (g/dL)   AST 15  0 - 37 (U/L)   ALT 13  0 - 35 (U/L)   Alkaline Phosphatase 89  39 - 117 (U/L)   Total Bilirubin 0.3  0.3 - 1.2 (mg/dL)   GFR calc non Af Amer >90  >90 (mL/min)   GFR calc Af Amer >90  >90 (mL/min)  LIPASE, BLOOD      Component Value Range   Lipase 33  11 - 59 (U/L)  URINALYSIS, ROUTINE W REFLEX MICROSCOPIC      Component Value Range   Color, Urine YELLOW  YELLOW    APPearance CLEAR  CLEAR    Specific Gravity, Urine 1.009  1.005 - 1.030    pH 7.5  5.0 - 8.0    Glucose, UA NEGATIVE  NEGATIVE (mg/dL)   Hgb urine dipstick NEGATIVE  NEGATIVE    Bilirubin Urine NEGATIVE  NEGATIVE    Ketones, ur NEGATIVE  NEGATIVE (mg/dL)   Protein, ur NEGATIVE  NEGATIVE (mg/dL)   Urobilinogen, UA 0.2  0.0 - 1.0 (mg/dL)   Nitrite NEGATIVE  NEGATIVE    Leukocytes, UA TRACE (*) NEGATIVE   URINE MICROSCOPIC-ADD ON      Component Value Range   Squamous Epithelial / LPF RARE  RARE    WBC, UA 0-2  <3 (WBC/hpf)   Bacteria, UA RARE  RARE    Ct Abdomen Pelvis W Contrast  11/17/2011  *RADIOLOGY REPORT*  Clinical Data: Persistent abdominal pain after colonoscopy.  CT ABDOMEN AND PELVIS WITH CONTRAST  Technique:  Multidetector CT imaging of the abdomen and pelvis was performed following the standard protocol during bolus administration of intravenous contrast.  Contrast: OMNIPAQUE IOHEXOL 300 MG/ML IV SOLN  Comparison: 11/12/2011.  Findings:  Calcified breast prosthesis.  Left lung base 1 cm calcified granuloma.  Dilated ascending thoracic aorta measure up to 4.1 cm.  Scattered diverticula most notable sigmoid colon. Mild haziness surrounding  proximal sigmoid colon diverticula which may represent mild diverticulitis type changes.  Contrast filled prominent sized appendix without surrounding inflammation.  No inflammation surrounds the terminal ileum.  No free intraperitoneal air.  Hiatal hernia.  Under distended stomach limiting evaluation.  Calcified granuloma within the liver and spleen.  No calcified gallstones.  No focal pancreatic, or adrenal lesion.  Low density lesions within the kidneys. 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  No hydronephrosis.  Degenerative changes most notable L4-5 and L5-S1.  Urinary bladder unremarkable.  IMPRESSION: Scattered diverticula most notable sigmoid colon. Mild haziness surrounding proximal sigmoid colon diverticula which may represent mild diverticulitis type changes.  Dilated ascending thoracic aorta measures up to 4.1 cm.  Please see above.  Original Report Authenticated By: Fuller Canada, M.D.   Ct Abdomen Pelvis W Contrast  11/12/2011  *RADIOLOGY REPORT*  Clinical Data: Abdominal pain with vomiting and dizziness which began after  colonoscopy this morning.  No rectal bleeding are diarrhea.  CT ABDOMEN AND PELVIS WITH CONTRAST  Technique:  Multidetector CT imaging of the abdomen and pelvis was performed following the standard protocol during bolus administration of intravenous contrast.  Contrast: 40mL OMNIPAQUE IOHEXOL 300 MG/ML IV SOLN, OMNIPAQUE IOHEXOL 300 MG/ML IV SOLN  Comparison: None.  Findings: Fibrosis or atelectasis in the lung bases.  Calcified granuloma in the right lung base.  Bilateral breast implants. Small esophageal hiatal hernia. There is a focal area of mild wall thickening and mesenteric infiltration in the splenic flexure of the colon which is nonspecific but might represent inflammatory process.  There is no free air or free fluid to suggest perforation of the colon is mostly  decompressed.  The stomach is decompressed but there appears to be some focal wall thickening along the greater curvature of the stomach with suggestion of intramural gas collections.  This could represent gas within the wall or between thickened folds. Gastritis is not excluded.  Calcified granulomas in the liver and spleen.  The gallbladder, pancreas, adrenal glands, abdominal aorta, and retroperitoneal lymph nodes are unremarkable.  Small renal parenchymal cysts bilaterally.  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  No solid mass or hydronephrosis in the kidneys.   The small bowel are not distended.  Contrast material extends to the colon without evidence of obstruction.   No free fluid or free air in the abdomen.  Pelvis:  Scattered diverticula in the colon without inflammatory process.  The appendix is mildly increased in diameter but is air- filled consistent with normal appendix. The bladder wall is not thickened.  IMPRESSION: 1. There is a focal area of mild wall thickening and mesenteric infiltration in the splenic flexure of the colon which is nonspecific but might represent inflammatory process.  There is no free air or free fluid to suggest perforation and the colon is mostly decompressed.  2. The stomach is decompressed but there appears to be some focal wall thickening along the greater curvature of the stomach with suggestion of intramural gas collections.  This could represent gas within the wall or between thickened folds. Gastritis is not excluded.  Small esophageal hiatal hernia.  Granulomatous changes.  Original Report Authenticated By: Marlon Pel, M.D.     No diagnosis found.  Workup is essentially negative.WBC has decreased, and no acute findings seen on CT, and no evidence of appendicitis.  Impression: abdominal pain  She will be sent home with a prescription for Percocet and is to follow up with her gastroenterologist.  MDM    Prior ED chart is reviewed. CT scan had shown an inflammatory loop of bowel in the region of the left upper quadrant which does not correlate with where her pain is now. She has not had her appendix removed, and there is concern with pain in the right lower abdomen that this could represent appendicitis. Since she had a baseline leukocytosis, a repeat CT scan is indicated.        Dione Booze, MD 11/17/11 2052

## 2011-11-17 NOTE — ED Notes (Signed)
CT notified that patient is finished her CT contrast.

## 2011-11-17 NOTE — ED Notes (Signed)
Pt. Denies any blood in her stool.

## 2011-12-04 ENCOUNTER — Emergency Department (HOSPITAL_COMMUNITY): Payer: PRIVATE HEALTH INSURANCE

## 2011-12-04 ENCOUNTER — Emergency Department (HOSPITAL_COMMUNITY)
Admission: EM | Admit: 2011-12-04 | Discharge: 2011-12-04 | Disposition: A | Discharge status: Home / Self Care | Payer: PRIVATE HEALTH INSURANCE | Attending: Emergency Medicine | Admitting: Emergency Medicine

## 2011-12-04 ENCOUNTER — Encounter (HOSPITAL_COMMUNITY): Payer: Self-pay

## 2011-12-04 DIAGNOSIS — G309 Alzheimer's disease, unspecified: Secondary | ICD-10-CM | POA: Insufficient documentation

## 2011-12-04 DIAGNOSIS — M25519 Pain in unspecified shoulder: Secondary | ICD-10-CM | POA: Insufficient documentation

## 2011-12-04 DIAGNOSIS — F0280 Dementia in other diseases classified elsewhere without behavioral disturbance: Secondary | ICD-10-CM | POA: Insufficient documentation

## 2011-12-04 DIAGNOSIS — W010XXA Fall on same level from slipping, tripping and stumbling without subsequent striking against object, initial encounter: Secondary | ICD-10-CM | POA: Insufficient documentation

## 2011-12-04 DIAGNOSIS — R1110 Vomiting, unspecified: Secondary | ICD-10-CM | POA: Insufficient documentation

## 2011-12-04 DIAGNOSIS — S40019A Contusion of unspecified shoulder, initial encounter: Secondary | ICD-10-CM | POA: Insufficient documentation

## 2011-12-04 DIAGNOSIS — E7800 Pure hypercholesterolemia, unspecified: Secondary | ICD-10-CM | POA: Insufficient documentation

## 2011-12-04 DIAGNOSIS — F209 Schizophrenia, unspecified: Secondary | ICD-10-CM | POA: Insufficient documentation

## 2011-12-04 DIAGNOSIS — T148XXA Other injury of unspecified body region, initial encounter: Secondary | ICD-10-CM

## 2011-12-04 DIAGNOSIS — R109 Unspecified abdominal pain: Secondary | ICD-10-CM | POA: Insufficient documentation

## 2011-12-04 DIAGNOSIS — R42 Dizziness and giddiness: Secondary | ICD-10-CM | POA: Insufficient documentation

## 2011-12-04 MED ORDER — HYDROCODONE-ACETAMINOPHEN 5-325 MG PO TABS
2.0000 | ORAL_TABLET | Freq: Once | ORAL | Status: AC
  Administered 2011-12-04: 2 via ORAL
  Filled 2011-12-04: qty 2

## 2011-12-04 MED ORDER — HYDROCODONE-ACETAMINOPHEN 5-325 MG PO TABS: 2.0000 | ORAL_TABLET | ORAL | Status: AC | PRN

## 2011-12-04 NOTE — ED Notes (Signed)
Patient transported to X-ray 

## 2011-12-04 NOTE — ED Notes (Signed)
Fel when she stepped on ice and is c/o lt arm pain

## 2011-12-04 NOTE — ED Provider Notes (Addendum)
History     CSN: [id number]  Arrival date & time 12/04/11  1508   First MD Initiated Contact with Patient 12/04/11 1528      Chief Complaint  Patient presents with  . Fall    (Consider location/radiation/quality/duration/timing/severity/associated sxs/prior treatment) HPI Patient slipped on ice and fell 1 PM today injuring her left shoulder no other complaint no other injury complains of pain at left shoulder worse with moving her left arm improved with remaining still no treatment prior to coming here. No other associated symptom Past Medical History  Diagnosis Date  . Alzheimer's dementia   . Hypercholesteremia   . Depression   . Schizophrenia     Past Surgical History  Procedure Date  . Abdominal hysterectomy   . Carpal tunnel release   . Tumor removal   . Mandible surgery     No family history on file.  History  Substance Use Topics  . Smoking status: Never Smoker   . Smokeless tobacco: Not on file  . Alcohol Use: No    OB History    Grav Para Term Preterm Abortions TAB SAB Ect Mult Living                  Review of Systems  Constitutional: Negative.   HENT: Negative.   Respiratory: Negative.   Cardiovascular: Negative.   Gastrointestinal: Negative.   Musculoskeletal: Positive for arthralgias.       Pain at left shoulder; otherwise negative  Skin: Negative.   Neurological: Negative.   Hematological: Negative.   Psychiatric/Behavioral: Negative.     Allergies  Review of patient's allergies indicates no known allergies.  Home Medications   Current Outpatient Rx  Name Route Sig Dispense Refill  . ASPIRIN EC 81 MG PO TBEC Oral Take 81 mg by mouth every morning.     Marland Kitchen BENZTROPINE MESYLATE 1 MG PO TABS Oral Take 1 mg by mouth 2 (two) times daily.     Marland Kitchen BIOTIN PO Oral Take 1 tablet by mouth every morning.     Marland Kitchen BUPROPION HCL ER (XL) 300 MG PO TB24 Oral Take 300 mg by mouth every morning.     Marland Kitchen CITALOPRAM HYDROBROMIDE 40 MG PO TABS Oral Take 40 mg  by mouth every morning.     Marland Kitchen CLONAZEPAM 1 MG PO TABS Oral Take 1 mg by mouth 3 (three) times daily.    Marland Kitchen EVENING PRIMROSE OIL PO Oral Take 1 capsule by mouth 2 (two) times daily.     . OMEGA-3 FATTY ACIDS 1000 MG PO CAPS Oral Take 1 g by mouth 2 (two) times daily.     . NYSTATIN-TRIAMCINOLONE 100000-0.1 UNIT/GM-% EX OINT Topical Apply 1 application topically 3 (three) times daily as needed. For rash on arm    . OMEPRAZOLE 40 MG PO CPDR Oral Take 40 mg by mouth every morning.     Marland Kitchen QUETIAPINE FUMARATE ER 300 MG PO TB24 Oral Take 600 mg by mouth at bedtime.     Marland Kitchen SIMVASTATIN 80 MG PO TABS Oral Take 80 mg by mouth at bedtime.     Marland Kitchen ZOLPIDEM TARTRATE 5 MG PO TABS Oral Take 5 mg by mouth at bedtime. For sleep      BP 130/74  Pulse 115  Temp(Src) 98.9 F (37.2 C) (Oral)  Resp 16  Ht 5\' 5"  (1.651 m)  Wt 418 lb 14 oz (190 kg)  BMI 69.70 kg/m2  SpO2 93%  Physical Exam  Nursing note and vitals reviewed.  Constitutional: She appears well-developed and well-nourished.  HENT:  Head: Normocephalic and atraumatic.       Edentulous  Eyes: Conjunctivae are normal. Pupils are equal, round, and reactive to light.  Neck: Neck supple. No tracheal deviation present. No thyromegaly present.  Cardiovascular: Normal rate and regular rhythm.   No murmur heard. Pulmonary/Chest: Effort normal and breath sounds normal.  Abdominal: Soft. Bowel sounds are normal. She exhibits no distension. There is no tenderness.  Musculoskeletal: Normal range of motion. She exhibits no edema and no tenderness.       Left upper extremity without swelling or deformity tender overlying proximal upper arm, clavicle nontender limited range of shoulder secondary to pain; radial pulse 2+ good capillary refill  Neurological: She is alert. Coordination normal.  Skin: Skin is warm and dry. No rash noted.  Psychiatric: She has a normal mood and affect.    ED Course  Procedures (including critical care time) 5 PM pain improved  after treatment with hydrocodone-A. Pap Labs Reviewed - No data to display No results found.   No diagnosis found.  Results for orders placed during the hospital encounter of 11/17/11  CBC      Component Value Range   WBC 5.4  4.0 - 10.5 (K/uL)   RBC 4.57  3.87 - 5.11 (MIL/uL)   Hemoglobin 14.0  12.0 - 15.0 (g/dL)   HCT 16.1  09.6 - 04.5 (%)   MCV 90.8  78.0 - 100.0 (fL)   MCH 30.6  26.0 - 34.0 (pg)   MCHC 33.7  30.0 - 36.0 (g/dL)   RDW 40.9  81.1 - 91.4 (%)   Platelets 206  150 - 400 (K/uL)  DIFFERENTIAL      Component Value Range   Neutrophils Relative 61  43 - 77 (%)   Neutro Abs 3.3  1.7 - 7.7 (K/uL)   Lymphocytes Relative 31  12 - 46 (%)   Lymphs Abs 1.7  0.7 - 4.0 (K/uL)   Monocytes Relative 7  3 - 12 (%)   Monocytes Absolute 0.4  0.1 - 1.0 (K/uL)   Eosinophils Relative 1  0 - 5 (%)   Eosinophils Absolute 0.1  0.0 - 0.7 (K/uL)   Basophils Relative 0  0 - 1 (%)   Basophils Absolute 0.0  0.0 - 0.1 (K/uL)  COMPREHENSIVE METABOLIC PANEL      Component Value Range   Sodium 140  135 - 145 (mEq/L)   Potassium 4.0  3.5 - 5.1 (mEq/L)   Chloride 106  96 - 112 (mEq/L)   CO2 25  19 - 32 (mEq/L)   Glucose, Bld 90  70 - 99 (mg/dL)   BUN 9  6 - 23 (mg/dL)   Creatinine, Ser 7.82  0.50 - 1.10 (mg/dL)   Calcium 9.4  8.4 - 95.6 (mg/dL)   Total Protein 6.7  6.0 - 8.3 (g/dL)   Albumin 3.6  3.5 - 5.2 (g/dL)   AST 15  0 - 37 (U/L)   ALT 13  0 - 35 (U/L)   Alkaline Phosphatase 89  39 - 117 (U/L)   Total Bilirubin 0.3  0.3 - 1.2 (mg/dL)   GFR calc non Af Amer >90  >90 (mL/min)   GFR calc Af Amer >90  >90 (mL/min)  LIPASE, BLOOD      Component Value Range   Lipase 33  11 - 59 (U/L)  URINALYSIS, ROUTINE W REFLEX MICROSCOPIC      Component Value Range   Color, Urine  YELLOW  YELLOW    APPearance CLEAR  CLEAR    Specific Gravity, Urine 1.009  1.005 - 1.030    pH 7.5  5.0 - 8.0    Glucose, UA NEGATIVE  NEGATIVE (mg/dL)   Hgb urine dipstick NEGATIVE  NEGATIVE    Bilirubin Urine  NEGATIVE  NEGATIVE    Ketones, ur NEGATIVE  NEGATIVE (mg/dL)   Protein, ur NEGATIVE  NEGATIVE (mg/dL)   Urobilinogen, UA 0.2  0.0 - 1.0 (mg/dL)   Nitrite NEGATIVE  NEGATIVE    Leukocytes, UA TRACE (*) NEGATIVE   URINE MICROSCOPIC-ADD ON      Component Value Range   Squamous Epithelial / LPF RARE  RARE    WBC, UA 0-2  <3 (WBC/hpf)   Bacteria, UA RARE  RARE    Ct Abdomen Pelvis W Contrast  11/17/2011  *RADIOLOGY REPORT*  Clinical Data: Persistent abdominal pain after colonoscopy.  CT ABDOMEN AND PELVIS WITH CONTRAST  Technique:  Multidetector CT imaging of the abdomen and pelvis was performed following the standard protocol during bolus administration of intravenous contrast.  Contrast: OMNIPAQUE IOHEXOL 300 MG/ML IV SOLN  Comparison: 11/12/2011.  Findings:  Calcified breast prosthesis.  Left lung base 1 cm calcified granuloma.  Dilated ascending thoracic aorta measure up to 4.1 cm.  Scattered diverticula most notable sigmoid colon. Mild haziness surrounding proximal sigmoid colon diverticula which may represent mild diverticulitis type changes.  Contrast filled prominent sized appendix without surrounding inflammation.  No inflammation surrounds the terminal ileum.  No free intraperitoneal air.  Hiatal hernia.  Under distended stomach limiting evaluation.  Calcified granuloma within the liver and spleen.  No calcified gallstones.  No focal pancreatic, or adrenal lesion.  Low density lesions within the kidneys. 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  No hydronephrosis.  Degenerative changes most notable L4-5 and L5-S1.  Urinary bladder unremarkable.  IMPRESSION: Scattered diverticula most notable sigmoid colon. Mild haziness surrounding proximal sigmoid colon diverticula which may represent mild diverticulitis type changes.  Dilated ascending thoracic aorta measures up to 4.1 cm.  Please see above.   Original Report Authenticated By: Fuller Canada, M.D.   Ct Abdomen Pelvis W Contrast  11/12/2011  *RADIOLOGY REPORT*  Clinical Data: Abdominal pain with vomiting and dizziness which began after colonoscopy this morning.  No rectal bleeding are diarrhea.  CT ABDOMEN AND PELVIS WITH CONTRAST  Technique:  Multidetector CT imaging of the abdomen and pelvis was performed following the standard protocol during bolus administration of intravenous contrast.  Contrast: 40mL OMNIPAQUE IOHEXOL 300 MG/ML IV SOLN, OMNIPAQUE IOHEXOL 300 MG/ML IV SOLN  Comparison: None.  Findings: Fibrosis or atelectasis in the lung bases.  Calcified granuloma in the right lung base.  Bilateral breast implants. Small esophageal hiatal hernia. There is a focal area of mild wall thickening and mesenteric infiltration in the splenic flexure of the colon which is nonspecific but might represent inflammatory process.  There is no free air or free fluid to suggest perforation of the colon is mostly decompressed.  The stomach is decompressed but there appears to be some focal wall thickening along the greater curvature of the stomach with suggestion of intramural gas collections.  This could represent gas within the wall or between thickened folds. Gastritis is not excluded.  Calcified granulomas in the liver and spleen.  The gallbladder, pancreas, adrenal glands, abdominal aorta, and retroperitoneal lymph nodes are  unremarkable.  Small renal parenchymal cysts bilaterally.  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  No solid mass or hydronephrosis in the kidneys.   The small bowel are not distended.  Contrast material extends to the colon without evidence of obstruction.   No free fluid or free air in the abdomen.  Pelvis:  Scattered diverticula in the colon without inflammatory process.  The appendix is mildly increased in diameter but is air- filled consistent with normal appendix. The bladder wall  is not thickened.  IMPRESSION: 1. There is a focal area of mild wall thickening and mesenteric infiltration in the splenic flexure of the colon which is nonspecific but might represent inflammatory process.  There is no free air or free fluid to suggest perforation and the colon is mostly decompressed.  2. The stomach is decompressed but there appears to be some focal wall thickening along the greater curvature of the stomach with suggestion of intramural gas collections.  This could represent gas within the wall or between thickened folds. Gastritis is not excluded.  Small esophageal hiatal hernia.  Granulomatous changes.  Original Report Authenticated By: Marlon Pel, M.D.   Dg Humerus Left  12/04/2011  *RADIOLOGY REPORT*  Clinical Data: History of injury from fall.  Pain in the area of proximal humerus and shoulder.  LEFT HUMERUS - 2+ VIEW  Comparison: None.  Findings: Alignment is normal.  Joint spaces are preserved.  No fracture or dislocation is evident.  No soft tissue lesions are seen.  IMPRESSION: No fracture or dislocation.  Original Report Authenticated By: Crawford Givens, M.D.     MDM  Plan prescription hydrocodone-A. Pap for pain followup with Dr. Willa Rough if significant pain by next week Diagnosis number1 fall Number2 contusion left upper extremity         Doug Sou, MD 12/04/11 1705  Doug Sou, MD 12/04/11 1610

## 2012-11-18 ENCOUNTER — Encounter (HOSPITAL_COMMUNITY): Payer: Self-pay | Admitting: Family Medicine

## 2012-11-18 ENCOUNTER — Emergency Department (HOSPITAL_COMMUNITY)
Admission: EM | Admit: 2012-11-18 | Discharge: 2012-11-18 | Disposition: A | Discharge status: Home / Self Care | Payer: PRIVATE HEALTH INSURANCE | Attending: Emergency Medicine | Admitting: Emergency Medicine

## 2012-11-18 DIAGNOSIS — F329 Major depressive disorder, single episode, unspecified: Secondary | ICD-10-CM | POA: Insufficient documentation

## 2012-11-18 DIAGNOSIS — G309 Alzheimer's disease, unspecified: Secondary | ICD-10-CM | POA: Insufficient documentation

## 2012-11-18 DIAGNOSIS — W540XXA Bitten by dog, initial encounter: Secondary | ICD-10-CM | POA: Insufficient documentation

## 2012-11-18 DIAGNOSIS — E7800 Pure hypercholesterolemia, unspecified: Secondary | ICD-10-CM | POA: Insufficient documentation

## 2012-11-18 DIAGNOSIS — F0280 Dementia in other diseases classified elsewhere without behavioral disturbance: Secondary | ICD-10-CM | POA: Insufficient documentation

## 2012-11-18 DIAGNOSIS — S51859A Open bite of unspecified forearm, initial encounter: Secondary | ICD-10-CM

## 2012-11-18 DIAGNOSIS — Y9389 Activity, other specified: Secondary | ICD-10-CM | POA: Insufficient documentation

## 2012-11-18 DIAGNOSIS — Z79899 Other long term (current) drug therapy: Secondary | ICD-10-CM | POA: Insufficient documentation

## 2012-11-18 DIAGNOSIS — F3289 Other specified depressive episodes: Secondary | ICD-10-CM | POA: Insufficient documentation

## 2012-11-18 DIAGNOSIS — S51809A Unspecified open wound of unspecified forearm, initial encounter: Secondary | ICD-10-CM | POA: Insufficient documentation

## 2012-11-18 DIAGNOSIS — F209 Schizophrenia, unspecified: Secondary | ICD-10-CM | POA: Insufficient documentation

## 2012-11-18 DIAGNOSIS — Z9889 Other specified postprocedural states: Secondary | ICD-10-CM | POA: Insufficient documentation

## 2012-11-18 DIAGNOSIS — Z7982 Long term (current) use of aspirin: Secondary | ICD-10-CM | POA: Insufficient documentation

## 2012-11-18 DIAGNOSIS — Z23 Encounter for immunization: Secondary | ICD-10-CM | POA: Insufficient documentation

## 2012-11-18 DIAGNOSIS — Y929 Unspecified place or not applicable: Secondary | ICD-10-CM | POA: Insufficient documentation

## 2012-11-18 MED ORDER — TETANUS-DIPHTH-ACELL PERTUSSIS 5-2.5-18.5 LF-MCG/0.5 IM SUSP
0.5000 mL | Freq: Once | INTRAMUSCULAR | Status: AC
  Administered 2012-11-18: 0.5 mL via INTRAMUSCULAR
  Filled 2012-11-18: qty 0.5

## 2012-11-18 MED ORDER — SODIUM CHLORIDE 0.9 % IV SOLN
Freq: Once | INTRAVENOUS | Status: AC
  Administered 2012-11-18: 20:00:00 via INTRAVENOUS

## 2012-11-18 MED ORDER — MORPHINE SULFATE 4 MG/ML IJ SOLN
4.0000 mg | Freq: Once | INTRAMUSCULAR | Status: AC
  Administered 2012-11-18: 4 mg via INTRAVENOUS
  Filled 2012-11-18: qty 1

## 2012-11-18 MED ORDER — HYDROCODONE-ACETAMINOPHEN 5-325 MG PO TABS: 1.0000 | ORAL_TABLET | Freq: Four times a day (QID) | ORAL | Status: DC | PRN

## 2012-11-18 MED ORDER — AMOXICILLIN-POT CLAVULANATE 875-125 MG PO TABS: 1.0000 | ORAL_TABLET | Freq: Two times a day (BID) | ORAL | Status: DC

## 2012-11-18 MED ORDER — AMOXICILLIN-POT CLAVULANATE 875-125 MG PO TABS
1.0000 | ORAL_TABLET | Freq: Once | ORAL | Status: AC
  Administered 2012-11-18: 1 via ORAL
  Filled 2012-11-18: qty 1

## 2012-11-18 NOTE — ED Notes (Signed)
Suture cart at bedside 

## 2012-11-18 NOTE — ED Provider Notes (Signed)
History   This chart was scribed for non-physician practitioner working with [name], MD by Frederik Pear, ED Scribe. This patient was seen in room TR06C/TR06C and the patient's care was started at 1840.   CSN: [id number]  Arrival date & time 11/18/12  1746   First MD Initiated Contact with Patient 11/18/12 1840      Chief Complaint  Patient presents with  . Animal Bite    (Consider location/radiation/quality/duration/timing/severity/associated sxs/prior treatment) Patient is a 62 y.o. female presenting with animal bite.  Animal Bite  The incident occurred just prior to arrival. The incident occurred at home. There is an injury to the left forearm. The pain is moderate. There have been no prior injuries to these areas. Her tetanus status is out of date.    [name] is a 62 y.o. female who presents to the Emergency Department complaining of a deep, constant left forearm laceration after being bitten by the vaccinated family dog PTA while trying to break up a dog fight.  She denies any h/o of DM or allergies to medication. She sates that her last tetanus shot was more than 5 years ago.   Past Medical History  Diagnosis Date  . Alzheimer's dementia   . Hypercholesteremia   . Depression   . Schizophrenia     Past Surgical History  Procedure Date  . Abdominal hysterectomy   . Carpal tunnel release   . Tumor removal   . Mandible surgery     History reviewed. No pertinent family history.  History  Substance Use Topics  . Smoking status: Never Smoker   . Smokeless tobacco: Not on file  . Alcohol Use: No    OB History    Grav Para Term Preterm Abortions TAB SAB Ect Mult Living                  Review of Systems  Skin: Positive for wound.  All other systems reviewed and are negative.    Allergies  Review of patient's allergies indicates no known allergies.  Home Medications   Current Outpatient Rx  Name  Route  Sig  Dispense  Refill  . ASPIRIN EC  81 MG PO TBEC   Oral   Take 81 mg by mouth every morning.          Marland Kitchen BENZTROPINE MESYLATE 1 MG PO TABS   Oral   Take 1 mg by mouth 2 (two) times daily.          Marland Kitchen BIOTIN PO   Oral   Take 1 tablet by mouth every morning.          Marland Kitchen BUPROPION HCL ER (XL) 300 MG PO TB24   Oral   Take 300 mg by mouth every morning.          Marland Kitchen CITALOPRAM HYDROBROMIDE 40 MG PO TABS   Oral   Take 40 mg by mouth every morning.          Marland Kitchen CLONAZEPAM 1 MG PO TABS   Oral   Take 1 mg by mouth 3 (three) times daily.         Marland Kitchen EVENING PRIMROSE OIL PO   Oral   Take 1 capsule by mouth 2 (two) times daily.          . OMEGA-3 FATTY ACIDS 1000 MG PO CAPS   Oral   Take 1 g by mouth 2 (two) times daily.          Marland Kitchen  NYSTATIN-TRIAMCINOLONE 100000-0.1 UNIT/GM-% EX OINT   Topical   Apply 1 application topically 3 (three) times daily as needed. For rash on arm         . OMEPRAZOLE 40 MG PO CPDR   Oral   Take 40 mg by mouth every morning.          Marland Kitchen QUETIAPINE FUMARATE ER 300 MG PO TB24   Oral   Take 600 mg by mouth at bedtime.          Marland Kitchen SIMVASTATIN 80 MG PO TABS   Oral   Take 80 mg by mouth at bedtime.          Marland Kitchen ZOLPIDEM TARTRATE 5 MG PO TABS   Oral   Take 5 mg by mouth at bedtime. For sleep           BP 118/82  Pulse 95  Temp 99.1 F (37.3 C)  Resp 18  SpO2 97%  Physical Exam  Nursing note and vitals reviewed. Constitutional: She is oriented to person, place, and time. She appears well-developed and well-nourished. No distress.  HENT:  Head: Normocephalic and atraumatic.  Eyes: EOM are normal. Pupils are equal, round, and reactive to light.  Neck: Normal range of motion. Neck supple. No tracheal deviation present.  Cardiovascular: Normal rate.   Pulmonary/Chest: Effort normal. No respiratory distress.  Abdominal: Soft. She exhibits no distension.  Musculoskeletal: Normal range of motion. She exhibits no edema.  Neurological: She is alert and oriented to  person, place, and time.  Skin: Skin is warm and dry.       She has a full thickness flap 2 inch lac to left mid-posterior forearm.  Psychiatric: She has a normal mood and affect. Her behavior is normal.    ED Course  Procedures (including critical care time)  DIAGNOSTIC STUDIES: Oxygen Saturation is 97% on room air, normal by my interpretation.    COORDINATION OF CARE:  18:47- Discussed planned course of treatment with the patient, including a laceration repair, who is agreeable at this time.  19:30- Medication Orders- 0.9% bolus infusion- once, morphine 4mg /ml injection 4 mg- once, Tdap (boostrix) injection 0.5 mL-once.  21:05- LACERATION REPAIR Performed by: Felicie Morn Consent: Verbal consent obtained. Risks and benefits: risks, benefits and alternatives were discussed Patient identity confirmed: provided demographic data Time out performed prior to procedure Prepped and Draped in normal sterile fashion Wound explored Laceration Location: left mid-posterior forearm Laceration Length: 1.2cm No Foreign Bodies seen or palpated Anesthesia: local infiltration Local anesthetic: lidocaine 1%  Anesthetic total: 4 cc Irrigation method: syringe and copiously irrigated Amount of cleaning: standard Skin closure: suture 4.0 ethilon Number of sutures or staples: 3 Technique: simple interrupted (loose)  Patient tolerance: Patient tolerated the procedure well with no immediate complications.   Labs Reviewed - No data to display No results found.   No diagnosis found. Patient discussed with and seen by Dr. Denton Lank.  Wounds cleaned, deep wound loosely closed with sutures.  Antibiotic prescription.  Return for recheck in 48 hours.  Dog bite to left forearm.    MDM     I personally performed the services described in this documentation, which was scribed in my presence. The recorded information has been reviewed and is accurate.       Jimmye Norman, NP 11/19/12 [phone]

## 2012-11-18 NOTE — ED Notes (Signed)
Per pt tried to break up dog fight and was bit in left arm. Pt almost 2 inch lac to forearm deep to the muscle. sts was her dogs and they are vaccinated

## 2012-11-20 NOTE — ED Provider Notes (Signed)
Medical screening examination/treatment/procedure(s) were conducted as a shared visit with non-physician practitioner(s) and myself.  I personally evaluated the patient during the encounter Pt tried to intervene between her two dogs, both acting normally/healthy. Puncture wound and irregular gaping lac to forearm. Radial pulse 2+, compartments soft, not tense. Discussed w pa, loosely approximately larger/gaping lac w couple sutures, irrigate very well, leave punctures open, augmenting, recheck 2 days.   [name], MD 11/20/12 [phone]

## 2012-11-22 ENCOUNTER — Emergency Department (HOSPITAL_COMMUNITY)
Admission: EM | Admit: 2012-11-22 | Discharge: 2012-11-22 | Discharge status: Against Medical Advice | Payer: PRIVATE HEALTH INSURANCE | Source: Home / Self Care

## 2012-11-22 ENCOUNTER — Emergency Department (HOSPITAL_BASED_OUTPATIENT_CLINIC_OR_DEPARTMENT_OTHER)
Admission: EM | Admit: 2012-11-22 | Discharge: 2012-11-22 | Disposition: A | Discharge status: Home / Self Care | Payer: PRIVATE HEALTH INSURANCE | Attending: Emergency Medicine | Admitting: Emergency Medicine

## 2012-11-22 ENCOUNTER — Encounter (HOSPITAL_BASED_OUTPATIENT_CLINIC_OR_DEPARTMENT_OTHER): Payer: Self-pay

## 2012-11-22 ENCOUNTER — Encounter (HOSPITAL_COMMUNITY): Payer: Self-pay | Admitting: *Deleted

## 2012-11-22 DIAGNOSIS — W540XXA Bitten by dog, initial encounter: Secondary | ICD-10-CM | POA: Insufficient documentation

## 2012-11-22 DIAGNOSIS — E7800 Pure hypercholesterolemia, unspecified: Secondary | ICD-10-CM | POA: Insufficient documentation

## 2012-11-22 DIAGNOSIS — T148XXA Other injury of unspecified body region, initial encounter: Secondary | ICD-10-CM

## 2012-11-22 DIAGNOSIS — Z79899 Other long term (current) drug therapy: Secondary | ICD-10-CM | POA: Insufficient documentation

## 2012-11-22 DIAGNOSIS — F0280 Dementia in other diseases classified elsewhere without behavioral disturbance: Secondary | ICD-10-CM | POA: Insufficient documentation

## 2012-11-22 DIAGNOSIS — Z7982 Long term (current) use of aspirin: Secondary | ICD-10-CM | POA: Insufficient documentation

## 2012-11-22 DIAGNOSIS — F209 Schizophrenia, unspecified: Secondary | ICD-10-CM | POA: Insufficient documentation

## 2012-11-22 DIAGNOSIS — S51809A Unspecified open wound of unspecified forearm, initial encounter: Secondary | ICD-10-CM | POA: Insufficient documentation

## 2012-11-22 DIAGNOSIS — Z9889 Other specified postprocedural states: Secondary | ICD-10-CM | POA: Insufficient documentation

## 2012-11-22 DIAGNOSIS — Y92009 Unspecified place in unspecified non-institutional (private) residence as the place of occurrence of the external cause: Secondary | ICD-10-CM | POA: Insufficient documentation

## 2012-11-22 DIAGNOSIS — Y939 Activity, unspecified: Secondary | ICD-10-CM | POA: Insufficient documentation

## 2012-11-22 DIAGNOSIS — Y929 Unspecified place or not applicable: Secondary | ICD-10-CM | POA: Insufficient documentation

## 2012-11-22 DIAGNOSIS — F329 Major depressive disorder, single episode, unspecified: Secondary | ICD-10-CM | POA: Insufficient documentation

## 2012-11-22 DIAGNOSIS — S41109A Unspecified open wound of unspecified upper arm, initial encounter: Secondary | ICD-10-CM | POA: Insufficient documentation

## 2012-11-22 DIAGNOSIS — F3289 Other specified depressive episodes: Secondary | ICD-10-CM | POA: Insufficient documentation

## 2012-11-22 DIAGNOSIS — G309 Alzheimer's disease, unspecified: Secondary | ICD-10-CM | POA: Insufficient documentation

## 2012-11-22 MED ORDER — HYDROCODONE-ACETAMINOPHEN 5-325 MG PO TABS: 2.0000 | ORAL_TABLET | ORAL | Status: DC | PRN

## 2012-11-22 NOTE — ED Notes (Signed)
Pt's son requesting to leave due to wati times, RN explained we had many rooms opening and she will be going back next. RN went to clean a room in back and when returned to Triage pt had left.

## 2012-11-22 NOTE — ED Notes (Signed)
NP at bedside.

## 2012-11-22 NOTE — ED Notes (Signed)
Pt was bite by own dog on Saturday.  All shot uptodate.  Pt was seen here and had sutures placed and now arm swollen with signs of infection, redness and pain.  Weak pulse to left arm

## 2012-11-22 NOTE — ED Provider Notes (Signed)
History     CSN: [id number]  Arrival date & time 11/22/12  1709   First MD Initiated Contact with Patient 11/22/12 1740      Chief Complaint  Patient presents with  . Animal Bite  . Arm Swelling    (Consider location/radiation/quality/duration/timing/severity/associated sxs/prior treatment) HPI Comments: Pt states that she was seen 4 days ago in the er and treated for a dog bite:pt was given something for pain and sutured:pt states that she is thinks the wound is infection  Patient is a 62 y.o. female presenting with animal bite. The history is provided by the patient. No language interpreter was used.  Animal Bite  The incident occurred more than 2 days ago. The incident occurred at home. There is an injury to the left forearm. The pain is moderate.    Past Medical History  Diagnosis Date  . Alzheimer's dementia   . Hypercholesteremia   . Depression   . Schizophrenia     Past Surgical History  Procedure Date  . Abdominal hysterectomy   . Carpal tunnel release   . Tumor removal   . Mandible surgery     No family history on file.  History  Substance Use Topics  . Smoking status: Never Smoker   . Smokeless tobacco: Not on file  . Alcohol Use: No    OB History    Grav Para Term Preterm Abortions TAB SAB Ect Mult Living                  Review of Systems  Constitutional: Negative.   Respiratory: Negative.   Cardiovascular: Negative.     Allergies  Review of patient's allergies indicates no known allergies.  Home Medications   Current Outpatient Rx  Name  Route  Sig  Dispense  Refill  . AMOXICILLIN-POT CLAVULANATE 875-125 MG PO TABS   Oral   Take 1 tablet by mouth every 12 (twelve) hours.   14 tablet   0   . ASPIRIN EC 81 MG PO TBEC   Oral   Take 81 mg by mouth every morning.          Marland Kitchen BENZTROPINE MESYLATE 1 MG PO TABS   Oral   Take 1 mg by mouth 2 (two) times daily.          Marland Kitchen BIOTIN PO   Oral   Take 1 tablet by mouth every morning.            Marland Kitchen BUPROPION HCL ER (XL) 300 MG PO TB24   Oral   Take 300 mg by mouth every morning.          Marland Kitchen CITALOPRAM HYDROBROMIDE 40 MG PO TABS   Oral   Take 40 mg by mouth every morning.          Marland Kitchen CLONAZEPAM 1 MG PO TABS   Oral   Take 1 mg by mouth 3 (three) times daily.         Marland Kitchen EVENING PRIMROSE OIL PO   Oral   Take 1 capsule by mouth 2 (two) times daily.          . OMEGA-3 FATTY ACIDS 1000 MG PO CAPS   Oral   Take 1 g by mouth 2 (two) times daily.          Marland Kitchen HYDROCODONE-ACETAMINOPHEN 5-325 MG PO TABS   Oral   Take 1 tablet by mouth every 6 (six) hours as needed for pain.   10 tablet   0   .  NYSTATIN-TRIAMCINOLONE 100000-0.1 UNIT/GM-% EX OINT   Topical   Apply 1 application topically 3 (three) times daily as needed. For rash on arm         . OMEPRAZOLE 40 MG PO CPDR   Oral   Take 40 mg by mouth every morning.          Marland Kitchen QUETIAPINE FUMARATE ER 300 MG PO TB24   Oral   Take 600 mg by mouth at bedtime.          Marland Kitchen SIMVASTATIN 80 MG PO TABS   Oral   Take 80 mg by mouth at bedtime.          Marland Kitchen ZOLPIDEM TARTRATE 5 MG PO TABS   Oral   Take 5 mg by mouth at bedtime. For sleep           BP 129/88  Pulse 82  Temp 98.9 F (37.2 C) (Oral)  Resp 18  Ht 5\' 5"  (1.651 m)  Wt 180 lb (81.647 kg)  BMI 29.95 kg/m2  SpO2 100%  Physical Exam  Nursing note and vitals reviewed. Constitutional: She is oriented to person, place, and time. She appears well-developed and well-nourished.  Cardiovascular: Normal rate and regular rhythm.   Pulmonary/Chest: Effort normal and breath sounds normal.  Musculoskeletal: Normal range of motion.  Neurological: She is alert and oriented to person, place, and time. Coordination normal.  Skin:       Pt has abrasion and sutured area to the left arm without abnormal redness:no drainage noted:mild generalized swelling noted to the arm:pt has full rom    ED Course  Procedures (including critical care time)  Labs  Reviewed - No data to display No results found.   1. Animal bite       MDM  Will give something more  For pain:no sign of infection noted at this time:pt given a referral to ortho for worsening symptoms        Teressa Lower, NP 11/22/12 2105

## 2012-11-22 NOTE — ED Notes (Signed)
Pt was bitten by family dog on Saturday, seen at [facility] and continues to have pain and swelling in left arm.

## 2012-11-23 NOTE — ED Provider Notes (Signed)
Medical screening examination/treatment/procedure(s) were performed by non-physician practitioner and as supervising physician I was immediately available for consultation/collaboration.   Carleene Cooper III, MD 11/23/12 1640

## 2013-03-08 IMAGING — CT CT ABD-PELV W/ CM
1 of 3 series · 13 of 32 positions shown, 18 images · IV contrast (APPLIED)
Comparison: None.

CLINICAL DATA: Abdominal pain with vomiting and dizziness which
began after colonoscopy this morning.  No rectal bleeding are
diarrhea.

CT ABDOMEN AND PELVIS WITH CONTRAST
TECHNIQUE: Multidetector CT imaging of the abdomen and pelvis was
performed following the standard protocol during bolus
administration of intravenous contrast.
Contrast: 40mL OMNIPAQUE IOHEXOL 300 MG/ML IV SOLN, 100mL OMNIPAQUE
IOHEXOL 300 MG/ML IV SOLN

[Series 2: abd/pelv with 5.0 b31f st · axial · 0.96mm/px · z∈[-586,-161]mm · 13 of 96 slices shown, 18 images]
[im 6/96  soft-tissue]
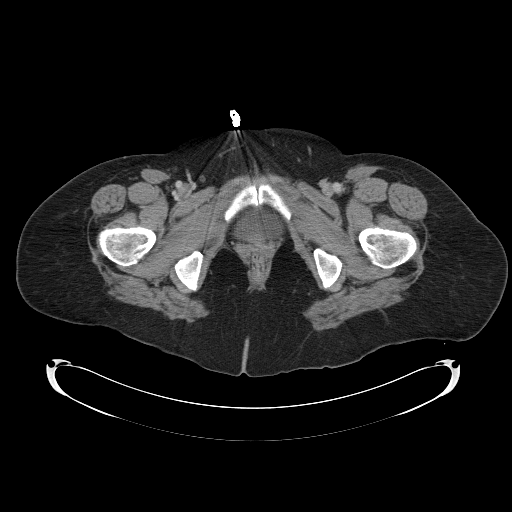
[im 6/96  bone]
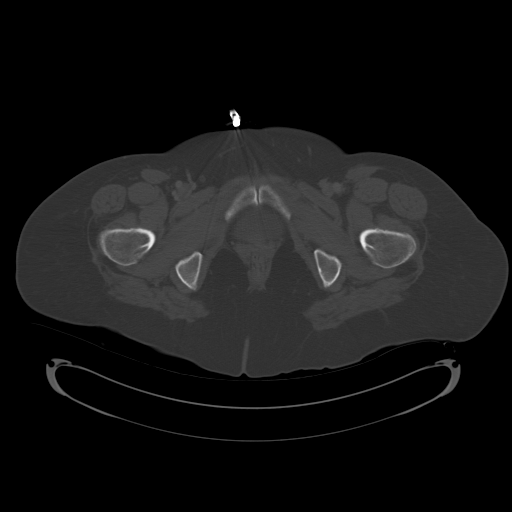
[im 16/96  soft-tissue]
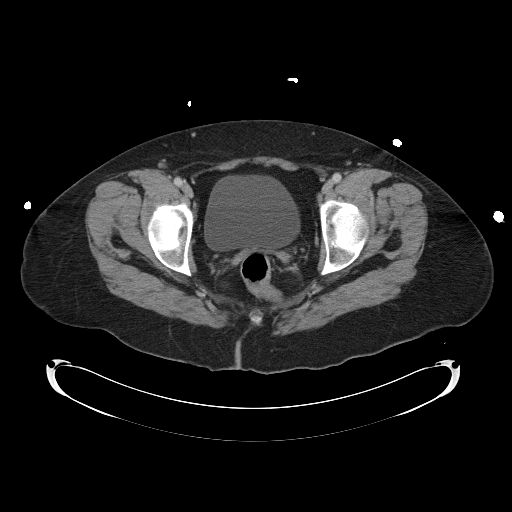
[im 21/96  soft-tissue]
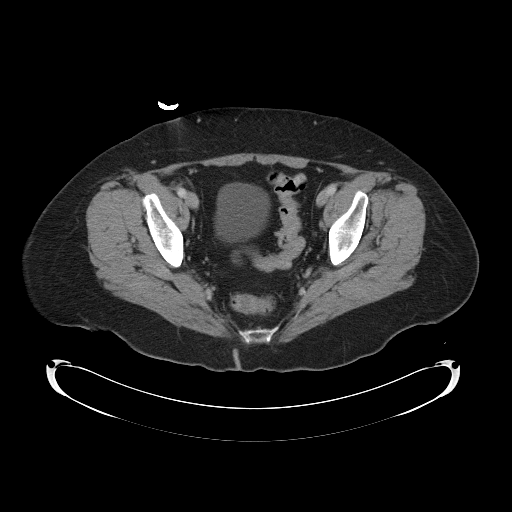
[im 31/96  soft-tissue]
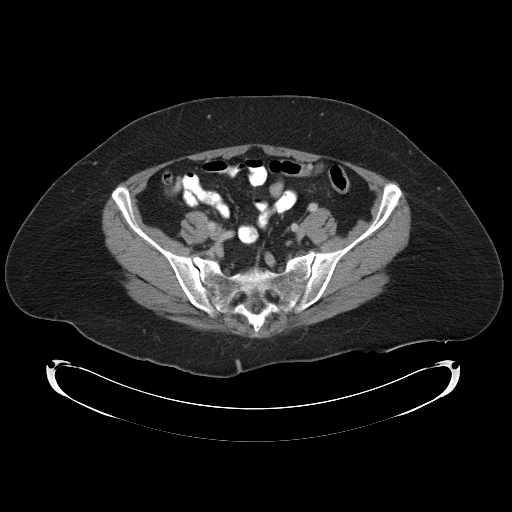
[im 36/96  soft-tissue]
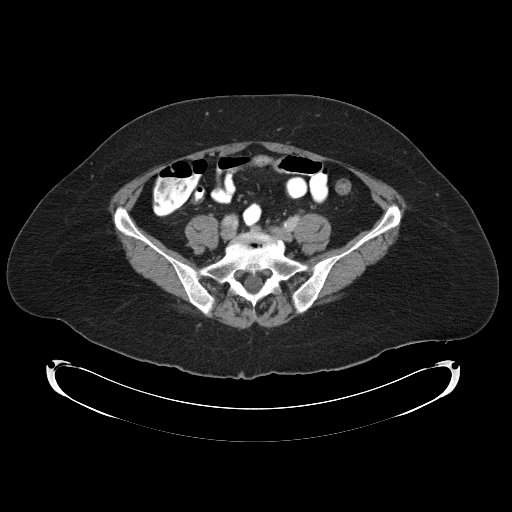
[im 46/96  soft-tissue]
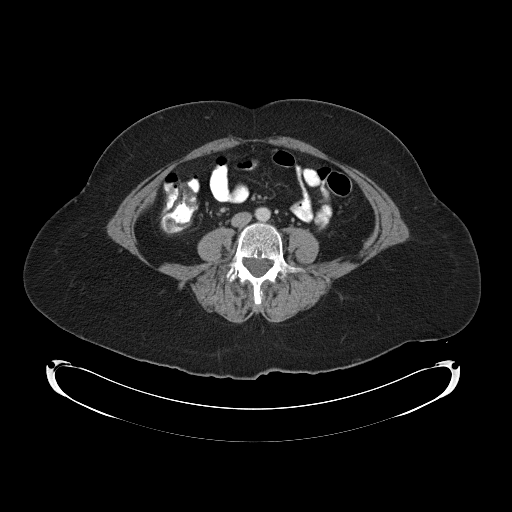
[im 51/96  soft-tissue]
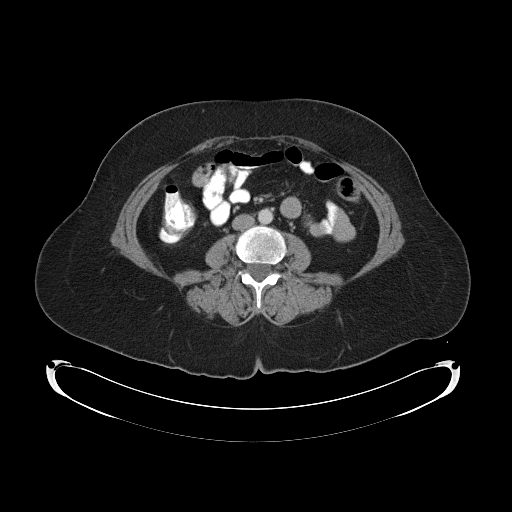
[im 61/96  soft-tissue]
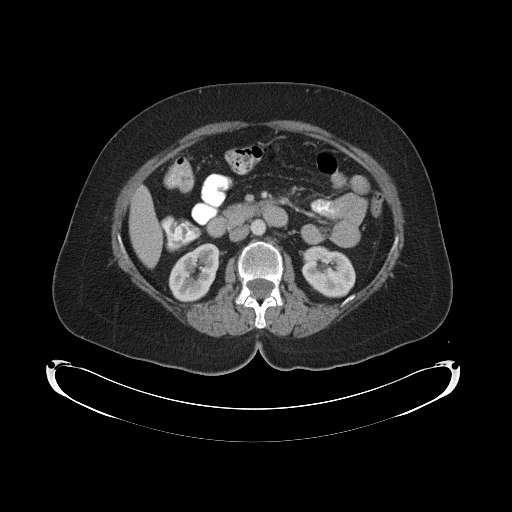
[im 66/96  soft-tissue]
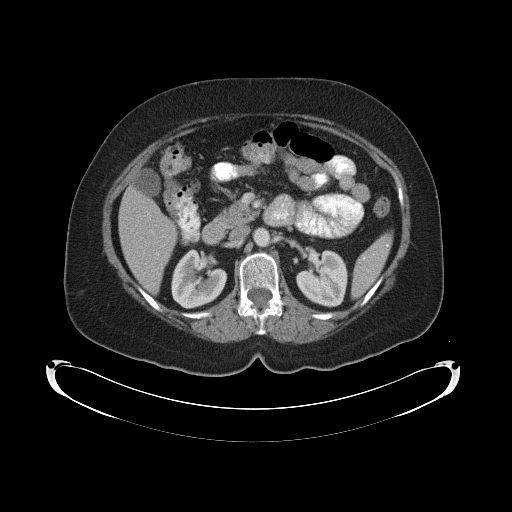
[im 66/96  bone]
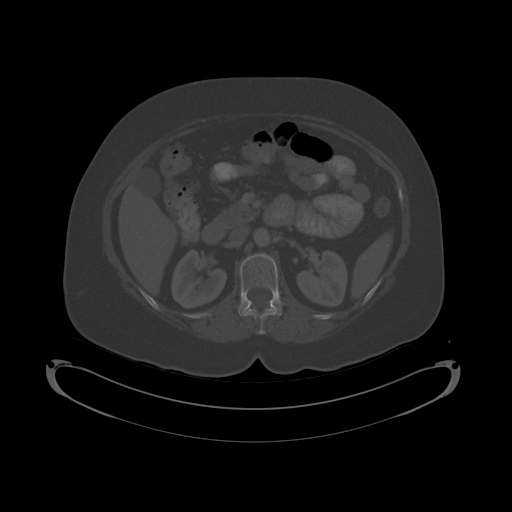
[im 76/96  soft-tissue]
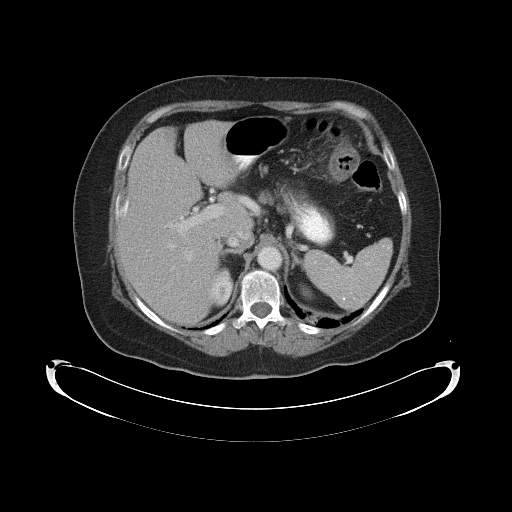
[im 76/96  lung]
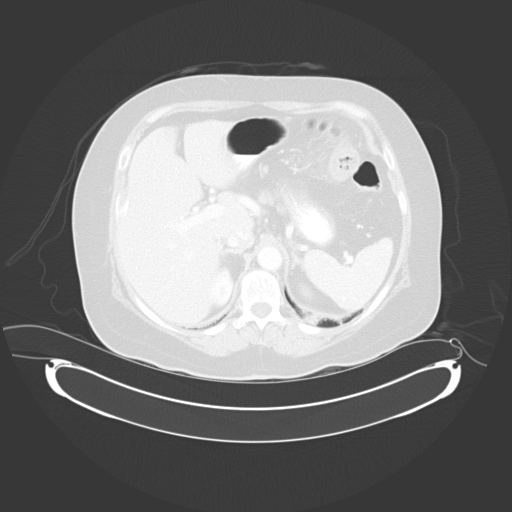
[im 81/96  soft-tissue]
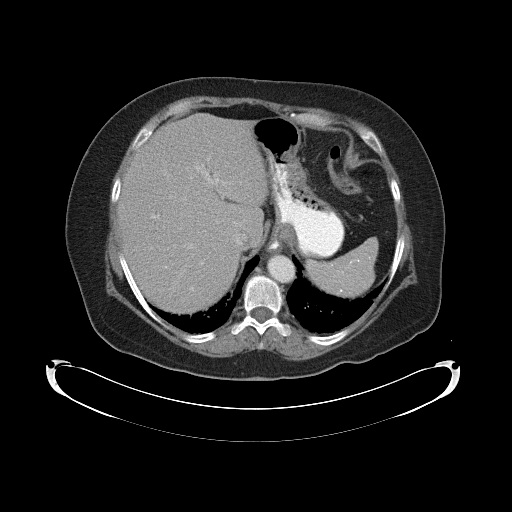
[im 81/96  lung]
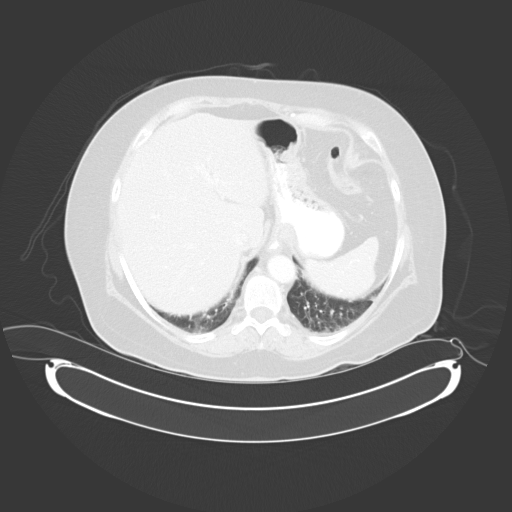
[im 86/96  lung]
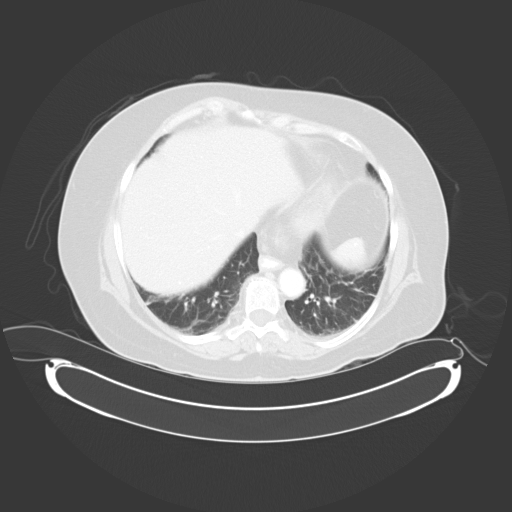
[im 91/96  soft-tissue]
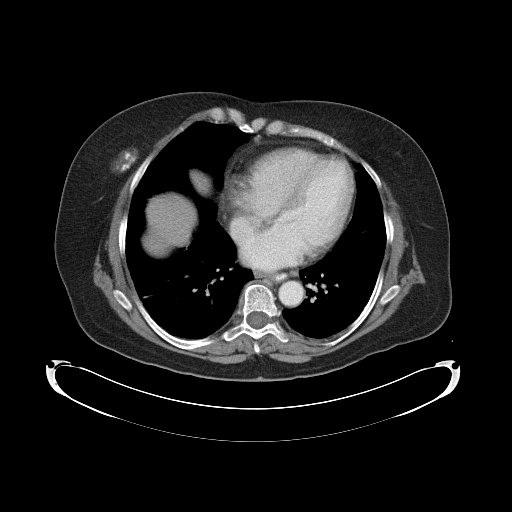
[im 91/96  lung]
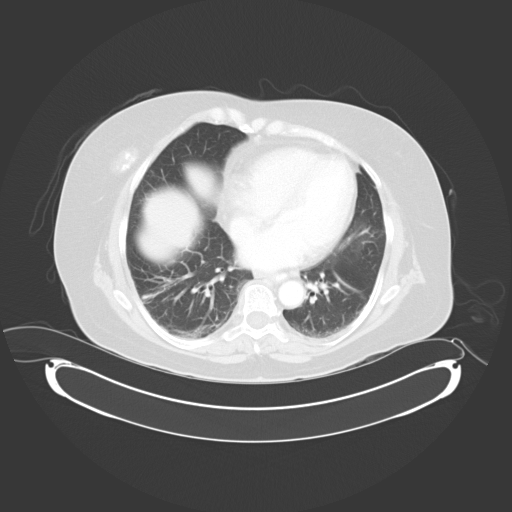

[13 of 32 positions shown; findings below may reference images not displayed]

FINDINGS: Fibrosis or atelectasis in the lung bases.  Calcified
granuloma in the right lung base.  Bilateral breast implants.
Small esophageal hiatal hernia. There is a focal area of mild wall
thickening and mesenteric infiltration in the splenic flexure of
the colon which is nonspecific but might represent inflammatory
process.  There is no free air or free fluid to suggest perforation
of the colon is mostly decompressed.  The stomach is decompressed
but there appears to be some focal wall thickening along the
greater curvature of the stomach with suggestion of intramural gas
collections.  This could represent gas within the wall or between
thickened folds. Gastritis is not excluded.

Calcified granulomas in the liver and spleen.  The gallbladder,
pancreas, adrenal glands, abdominal aorta, and retroperitoneal
lymph nodes are unremarkable.  Small renal parenchymal cysts
bilaterally.  Sub centimeter fat foci in the left renal parenchyma
consistent with angiomyolipomas.  No solid mass or hydronephrosis
in the kidneys.   The small bowel are not distended.  Contrast
material extends to the colon without evidence of obstruction.   No
free fluid or free air in the abdomen.

Pelvis:  Scattered diverticula in the colon without inflammatory
process.  The appendix is mildly increased in diameter but is air-
filled consistent with normal appendix. The bladder wall is not
thickened.
IMPRESSION: 1. There is a focal area of mild wall thickening and mesenteric
infiltration in the splenic flexure of the colon which is
nonspecific but might represent inflammatory process.  There is no
free air or free fluid to suggest perforation and the colon is
mostly decompressed.

2. The stomach is decompressed but there appears to be some focal
wall thickening along the greater curvature of the stomach with
suggestion of intramural gas collections.  This could represent gas
within the wall or between thickened folds. Gastritis is not
excluded.  Small esophageal hiatal hernia.  Granulomatous changes.

## 2013-04-06 ENCOUNTER — Ambulatory Visit: Payer: PRIVATE HEALTH INSURANCE | Admitting: Rehabilitation

## 2013-04-15 DIAGNOSIS — J309 Allergic rhinitis, unspecified: Secondary | ICD-10-CM | POA: Insufficient documentation

## 2013-04-15 DIAGNOSIS — F32A Depression, unspecified: Secondary | ICD-10-CM | POA: Insufficient documentation

## 2013-04-15 DIAGNOSIS — G43909 Migraine, unspecified, not intractable, without status migrainosus: Secondary | ICD-10-CM | POA: Insufficient documentation

## 2013-04-15 DIAGNOSIS — G4700 Insomnia, unspecified: Secondary | ICD-10-CM | POA: Insufficient documentation

## 2013-04-15 DIAGNOSIS — K5730 Diverticulosis of large intestine without perforation or abscess without bleeding: Secondary | ICD-10-CM | POA: Insufficient documentation

## 2013-04-15 DIAGNOSIS — K5900 Constipation, unspecified: Secondary | ICD-10-CM | POA: Insufficient documentation

## 2013-04-17 ENCOUNTER — Ambulatory Visit: Payer: PRIVATE HEALTH INSURANCE

## 2013-04-18 ENCOUNTER — Ambulatory Visit: Payer: PRIVATE HEALTH INSURANCE | Attending: Neurology | Admitting: Physical Therapy

## 2013-04-18 DIAGNOSIS — M545 Low back pain, unspecified: Secondary | ICD-10-CM | POA: Insufficient documentation

## 2013-04-18 DIAGNOSIS — IMO0001 Reserved for inherently not codable concepts without codable children: Secondary | ICD-10-CM | POA: Insufficient documentation

## 2013-05-01 ENCOUNTER — Ambulatory Visit: Payer: PRIVATE HEALTH INSURANCE | Admitting: Physical Therapy

## 2013-05-03 ENCOUNTER — Ambulatory Visit: Payer: PRIVATE HEALTH INSURANCE | Admitting: Rehabilitation

## 2013-05-15 ENCOUNTER — Ambulatory Visit: Payer: PRIVATE HEALTH INSURANCE | Admitting: Physical Therapy

## 2013-05-29 ENCOUNTER — Ambulatory Visit: Payer: PRIVATE HEALTH INSURANCE

## 2013-06-01 ENCOUNTER — Ambulatory Visit: Payer: PRIVATE HEALTH INSURANCE | Admitting: Physical Therapy

## 2013-07-21 DIAGNOSIS — K219 Gastro-esophageal reflux disease without esophagitis: Secondary | ICD-10-CM | POA: Insufficient documentation

## 2014-05-24 ENCOUNTER — Emergency Department (HOSPITAL_BASED_OUTPATIENT_CLINIC_OR_DEPARTMENT_OTHER)
Admission: EM | Admit: 2014-05-24 | Discharge: 2014-05-24 | Disposition: A | Discharge status: Home / Self Care | Payer: PRIVATE HEALTH INSURANCE | Attending: Emergency Medicine | Admitting: Emergency Medicine

## 2014-05-24 ENCOUNTER — Encounter (HOSPITAL_BASED_OUTPATIENT_CLINIC_OR_DEPARTMENT_OTHER): Payer: Self-pay | Admitting: Emergency Medicine

## 2014-05-24 DIAGNOSIS — S0501XA Injury of conjunctiva and corneal abrasion without foreign body, right eye, initial encounter: Secondary | ICD-10-CM

## 2014-05-24 DIAGNOSIS — Y929 Unspecified place or not applicable: Secondary | ICD-10-CM | POA: Diagnosis not present

## 2014-05-24 DIAGNOSIS — E7800 Pure hypercholesterolemia, unspecified: Secondary | ICD-10-CM | POA: Insufficient documentation

## 2014-05-24 DIAGNOSIS — Z792 Long term (current) use of antibiotics: Secondary | ICD-10-CM | POA: Diagnosis not present

## 2014-05-24 DIAGNOSIS — Z79899 Other long term (current) drug therapy: Secondary | ICD-10-CM | POA: Diagnosis not present

## 2014-05-24 DIAGNOSIS — S058X9A Other injuries of unspecified eye and orbit, initial encounter: Secondary | ICD-10-CM | POA: Diagnosis not present

## 2014-05-24 DIAGNOSIS — F3289 Other specified depressive episodes: Secondary | ICD-10-CM | POA: Diagnosis not present

## 2014-05-24 DIAGNOSIS — F329 Major depressive disorder, single episode, unspecified: Secondary | ICD-10-CM | POA: Insufficient documentation

## 2014-05-24 DIAGNOSIS — Y9389 Activity, other specified: Secondary | ICD-10-CM | POA: Insufficient documentation

## 2014-05-24 DIAGNOSIS — F209 Schizophrenia, unspecified: Secondary | ICD-10-CM | POA: Insufficient documentation

## 2014-05-24 DIAGNOSIS — S0510XA Contusion of eyeball and orbital tissues, unspecified eye, initial encounter: Secondary | ICD-10-CM | POA: Diagnosis present

## 2014-05-24 DIAGNOSIS — S0591XA Unspecified injury of right eye and orbit, initial encounter: Secondary | ICD-10-CM

## 2014-05-24 DIAGNOSIS — Z7982 Long term (current) use of aspirin: Secondary | ICD-10-CM | POA: Insufficient documentation

## 2014-05-24 DIAGNOSIS — W208XXA Other cause of strike by thrown, projected or falling object, initial encounter: Secondary | ICD-10-CM | POA: Insufficient documentation

## 2014-05-24 MED ORDER — FLUORESCEIN SODIUM 1 MG OP STRP
1.0000 | ORAL_STRIP | Freq: Once | OPHTHALMIC | Status: AC
  Administered 2014-05-24: 1 via OPHTHALMIC
  Filled 2014-05-24: qty 1

## 2014-05-24 MED ORDER — ERYTHROMYCIN 5 MG/GM OP OINT
TOPICAL_OINTMENT | OPHTHALMIC | Status: DC
Start: 2014-05-24 — End: 2020-09-27

## 2014-05-24 MED ORDER — HYDROCODONE-ACETAMINOPHEN 5-325 MG PO TABS: 1.0000 | ORAL_TABLET | ORAL | Status: DC | PRN

## 2014-05-24 MED ORDER — TETRACAINE HCL 0.5 % OP SOLN
2.0000 [drp] | Freq: Once | OPHTHALMIC | Status: AC
  Administered 2014-05-24: 2 [drp] via OPHTHALMIC
  Filled 2014-05-24: qty 2

## 2014-05-24 NOTE — ED Provider Notes (Signed)
CSN: [id number]     Arrival date & time 05/24/14  1403 History   First MD Initiated Contact with Patient 05/24/14 1501     Chief Complaint  Patient presents with  . Eye Injury     (Consider location/radiation/quality/duration/timing/severity/associated sxs/prior Treatment) HPI 63 year old female presents after being hit in the eye with a tree branch. She states she was playing with her dog yesterday and had her glasses temporarily often a tree branch hit her in the right eye. She feels a foreign body sensation mD something under her lids. Her son looked but could not see anything. She's had continued pain and tearing. Her entire eye is red. She states her eye is maybe a little blurry than normal but the most part is similar. She normally wears glasses but does not wear contacts. No other injuries.  Past Medical History  Diagnosis Date  . Alzheimer's dementia   . Hypercholesteremia   . Depression   . Schizophrenia    Past Surgical History  Procedure Laterality Date  . Abdominal hysterectomy    . Carpal tunnel release    . Tumor removal    . Mandible surgery     No family history on file. History  Substance Use Topics  . Smoking status: Never Smoker   . Smokeless tobacco: Not on file  . Alcohol Use: No   OB History   Grav Para Term Preterm Abortions TAB SAB Ect Mult Living                 Review of Systems  Eyes: Positive for photophobia, pain and redness.  Gastrointestinal: Positive for vomiting (yesterday).  All other systems reviewed and are negative.     Allergies  Review of patient's allergies indicates no known allergies.  Home Medications   Prior to Admission medications   Medication Sig Start Date End Date Taking? Authorizing Provider  amoxicillin-clavulanate (AUGMENTIN) 875-125 MG per tablet Take 1 tablet by mouth every 12 (twelve) hours. 11/18/12   Norman Herrlich, NP  aspirin EC 81 MG tablet Take 81 mg by mouth every morning.     Historical Provider, MD   benztropine (COGENTIN) 1 MG tablet Take 1 mg by mouth 2 (two) times daily.     Historical Provider, MD  BIOTIN PO Take 1 tablet by mouth every morning.     Historical Provider, MD  buPROPion (WELLBUTRIN XL) 300 MG 24 hr tablet Take 300 mg by mouth every morning.     Historical Provider, MD  citalopram (CELEXA) 40 MG tablet Take 40 mg by mouth every morning.     Historical Provider, MD  clonazePAM (KLONOPIN) 1 MG tablet Take 1 mg by mouth 3 (three) times daily.    Historical Provider, MD  EVENING PRIMROSE OIL PO Take 1 capsule by mouth 2 (two) times daily.     Historical Provider, MD  fish oil-omega-3 fatty acids 1000 MG capsule Take 1 g by mouth 2 (two) times daily.     Historical Provider, MD  HYDROcodone-acetaminophen (NORCO/VICODIN) 5-325 MG per tablet Take 1 tablet by mouth every 6 (six) hours as needed for pain. 11/18/12   Norman Herrlich, NP  HYDROcodone-acetaminophen (NORCO/VICODIN) 5-325 MG per tablet Take 2 tablets by mouth every 4 (four) hours as needed for pain. 11/22/12   Glendell Docker, NP  nystatin-triamcinolone (MYCOLOG) ointment Apply 1 application topically 3 (three) times daily as needed. For rash on arm    Historical Provider, MD  omeprazole (PRILOSEC) 40 MG capsule Take 40  mg by mouth every morning.     Historical Provider, MD  QUEtiapine (SEROQUEL XR) 300 MG 24 hr tablet Take 600 mg by mouth at bedtime.     Historical Provider, MD  simvastatin (ZOCOR) 80 MG tablet Take 80 mg by mouth at bedtime.     Historical Provider, MD  zolpidem (AMBIEN) 5 MG tablet Take 5 mg by mouth at bedtime. For sleep    Historical Provider, MD   BP 123/83  Pulse 84  Temp(Src) 98.7 F (37.1 C) (Oral)  Resp 20  Ht 5' 5.5" (1.664 m)  Wt 208 lb (94.348 kg)  BMI 34.07 kg/m2  SpO2 97% Physical Exam  Nursing note and vitals reviewed. Constitutional: She is oriented to person, place, and time. She appears well-developed and well-nourished. No distress.  HENT:  Head: Normocephalic and atraumatic.   Right Ear: External ear normal.  Left Ear: External ear normal.  Nose: Nose normal.  Eyes: EOM and lids are normal. Pupils are equal, round, and reactive to light. Lids are everted and swept, no foreign bodies found. Right eye exhibits no discharge. No foreign body present in the right eye. Left eye exhibits no discharge. Right conjunctiva is injected (diffuse).  Slit lamp exam:      The right eye shows corneal abrasion. The right eye shows no foreign body.  Neck: Neck supple.  Cardiovascular: Normal rate.   Pulmonary/Chest: Effort normal and breath sounds normal.  Abdominal: She exhibits no distension.  Neurological: She is alert and oriented to person, place, and time.  Skin: Skin is warm and dry. She is not diaphoretic.    ED Course  Procedures (including critical care time) Labs Review Labs Reviewed - No data to display  Imaging Review No results found.   EKG Interpretation None      MDM   Final diagnoses:  Right corneal abrasion, initial encounter  Right eye injury, initial encounter    Patient appears to have a moderate sized corneal abrasion to right eye. No signs of laceration. Highly doubt she has open globe. The branch was intact after injury, highly doubt penetrating foreign body. Pain improved in ED. Has mildly decreased vision in that eye (20/40 compared to 20/20). Will place on topical abx and oral pain control. Will refer to ophthamology later today or tomorrow morning. Discussed return precautions.     Ephraim Hamburger, MD 05/24/14 [phone]

## 2014-05-24 NOTE — ED Notes (Signed)
EDP has assessed Pt. And treated Pt. Appropriately for ER needs.  RN to follow.

## 2014-05-24 NOTE — ED Notes (Signed)
Eye injury. She was hit in the right eye with a tree limb. C.o soreness that is worse when she blinks.

## 2014-07-17 DIAGNOSIS — E538 Deficiency of other specified B group vitamins: Secondary | ICD-10-CM | POA: Insufficient documentation

## 2014-11-17 ENCOUNTER — Emergency Department (HOSPITAL_BASED_OUTPATIENT_CLINIC_OR_DEPARTMENT_OTHER): Payer: Medicare Other

## 2014-11-17 ENCOUNTER — Emergency Department (HOSPITAL_BASED_OUTPATIENT_CLINIC_OR_DEPARTMENT_OTHER)
Admission: EM | Admit: 2014-11-17 | Discharge: 2014-11-18 | Disposition: A | Discharge status: Home / Self Care | Payer: Medicare Other | Attending: Emergency Medicine | Admitting: Emergency Medicine

## 2014-11-17 ENCOUNTER — Encounter (HOSPITAL_BASED_OUTPATIENT_CLINIC_OR_DEPARTMENT_OTHER): Payer: Self-pay | Admitting: *Deleted

## 2014-11-17 DIAGNOSIS — G309 Alzheimer's disease, unspecified: Secondary | ICD-10-CM | POA: Insufficient documentation

## 2014-11-17 DIAGNOSIS — F209 Schizophrenia, unspecified: Secondary | ICD-10-CM | POA: Insufficient documentation

## 2014-11-17 DIAGNOSIS — R42 Dizziness and giddiness: Secondary | ICD-10-CM

## 2014-11-17 DIAGNOSIS — H9209 Otalgia, unspecified ear: Secondary | ICD-10-CM | POA: Insufficient documentation

## 2014-11-17 DIAGNOSIS — F0281 Dementia in other diseases classified elsewhere with behavioral disturbance: Secondary | ICD-10-CM | POA: Diagnosis not present

## 2014-11-17 DIAGNOSIS — R112 Nausea with vomiting, unspecified: Secondary | ICD-10-CM | POA: Diagnosis present

## 2014-11-17 DIAGNOSIS — R251 Tremor, unspecified: Secondary | ICD-10-CM | POA: Insufficient documentation

## 2014-11-17 DIAGNOSIS — E876 Hypokalemia: Secondary | ICD-10-CM | POA: Diagnosis not present

## 2014-11-17 DIAGNOSIS — Z79899 Other long term (current) drug therapy: Secondary | ICD-10-CM | POA: Diagnosis not present

## 2014-11-17 DIAGNOSIS — E860 Dehydration: Secondary | ICD-10-CM | POA: Diagnosis not present

## 2014-11-17 DIAGNOSIS — Z7982 Long term (current) use of aspirin: Secondary | ICD-10-CM | POA: Insufficient documentation

## 2014-11-17 DIAGNOSIS — E780 Pure hypercholesterolemia: Secondary | ICD-10-CM | POA: Diagnosis not present

## 2014-11-17 DIAGNOSIS — F329 Major depressive disorder, single episode, unspecified: Secondary | ICD-10-CM | POA: Insufficient documentation

## 2014-11-17 MED ORDER — ONDANSETRON HCL 4 MG/2ML IJ SOLN
4.0000 mg | Freq: Once | INTRAMUSCULAR | Status: AC
  Administered 2014-11-17: 4 mg via INTRAVENOUS
  Filled 2014-11-17: qty 2

## 2014-11-17 MED ORDER — SODIUM CHLORIDE 0.9 % IV BOLUS (SEPSIS)
500.0000 mL | Freq: Once | INTRAVENOUS | Status: AC
  Administered 2014-11-17: 500 mL via INTRAVENOUS

## 2014-11-17 NOTE — ED Notes (Signed)
C/o n/v since yesterday and left ear pain. Dizziness. No diarrhea.

## 2014-11-17 NOTE — ED Provider Notes (Signed)
CSN: [id number]     Arrival date & time 11/17/14  2032 History  This chart was scribe for No att. providers found by Judithann Sauger, ED Scribe. The patient was seen in room MHOTF/OTF and the patient's care was started at 11:31 PM.    Chief Complaint  Patient presents with  . Nausea   The history is provided by the patient. No language interpreter was used.   HPI Comments: [name] is a 64 y.o. female who presents to the Emergency Department complaining of nausea and vomiting onset 2 days ago. She reports associated dizziness, mild ear pain and a "buzz" in her ear. She also reports an uncontrollable shaking of her right hand. She denies HA, hematemesis, and abdominal pain. She denies a hx of stroke. She denies any new medication. Her family member reports that he has been sick for about a week but is unsure if he gave her anything. He denies that the pt has suffered any recent falls.  Past Medical History  Diagnosis Date  . Alzheimer's dementia   . Hypercholesteremia   . Depression   . Schizophrenia    Past Surgical History  Procedure Laterality Date  . Abdominal hysterectomy    . Carpal tunnel release    . Tumor removal    . Mandible surgery     No family history on file. History  Substance Use Topics  . Smoking status: Never Smoker   . Smokeless tobacco: Not on file  . Alcohol Use: No   OB History    No data available     Review of Systems  HENT: Positive for ear pain.   Gastrointestinal: Positive for nausea and vomiting. Negative for abdominal pain.  Neurological: Positive for dizziness and tremors. Negative for headaches.  All other systems reviewed and are negative.     Allergies  Review of patient's allergies indicates no known allergies.  Home Medications   Prior to Admission medications   Medication Sig Start Date End Date Taking? Authorizing Provider  aspirin EC 81 MG tablet Take 81 mg by mouth every morning.    Yes Historical Provider, MD   benztropine (COGENTIN) 1 MG tablet Take 1 mg by mouth 2 (two) times daily.    Yes Historical Provider, MD  BIOTIN PO Take 1 tablet by mouth every morning.    Yes Historical Provider, MD  buPROPion (WELLBUTRIN XL) 300 MG 24 hr tablet Take 300 mg by mouth every morning.    Yes Historical Provider, MD  citalopram (CELEXA) 40 MG tablet Take 40 mg by mouth every morning.    Yes Historical Provider, MD  fish oil-omega-3 fatty acids 1000 MG capsule Take 1 g by mouth 2 (two) times daily.    Yes Historical Provider, MD  nystatin-triamcinolone (MYCOLOG) ointment Apply 1 application topically 3 (three) times daily as needed. For rash on arm   Yes Historical Provider, MD  omeprazole (PRILOSEC) 40 MG capsule Take 40 mg by mouth every morning.    Yes Historical Provider, MD  QUEtiapine (SEROQUEL XR) 300 MG 24 hr tablet Take 600 mg by mouth at bedtime.    Yes Historical Provider, MD  zolpidem (AMBIEN) 5 MG tablet Take 5 mg by mouth at bedtime. For sleep   Yes Historical Provider, MD  amoxicillin-clavulanate (AUGMENTIN) 875-125 MG per tablet Take 1 tablet by mouth every 12 (twelve) hours. 11/18/12   Norman Herrlich, NP  clonazePAM (KLONOPIN) 1 MG tablet Take 1 mg by mouth 3 (three) times daily.  Historical Provider, MD  erythromycin ophthalmic ointment Place a 1/2 inch ribbon of ointment into the lower eyelid 4 times per day for the next 3 days 05/24/14   Ephraim Hamburger, MD  EVENING PRIMROSE OIL PO Take 1 capsule by mouth 2 (two) times daily.     Historical Provider, MD  HYDROcodone-acetaminophen (NORCO) 5-325 MG per tablet Take 1-2 tablets by mouth every 4 (four) hours as needed. 05/24/14   Ephraim Hamburger, MD  HYDROcodone-acetaminophen (NORCO/VICODIN) 5-325 MG per tablet Take 1 tablet by mouth every 6 (six) hours as needed for pain. 11/18/12   Norman Herrlich, NP  HYDROcodone-acetaminophen (NORCO/VICODIN) 5-325 MG per tablet Take 2 tablets by mouth every 4 (four) hours as needed for pain. 11/22/12   Glendell Docker, NP  ondansetron (ZOFRAN ODT) 4 MG disintegrating tablet 4mg  ODT q4 hours prn nausea/vomit 11/18/14   Mariea Clonts, MD  potassium chloride SA (K-DUR,KLOR-CON) 20 MEQ tablet Take 1 tablet (20 mEq total) by mouth daily. 11/18/14   Mariea Clonts, MD  simvastatin (ZOCOR) 80 MG tablet Take 80 mg by mouth at bedtime.     Historical Provider, MD   BP 132/74 mmHg  Pulse 66  Temp(Src) 98.8 F (37.1 C) (Oral)  Resp 16  Ht 5\' 5"  (1.651 m)  Wt 196 lb (88.905 kg)  BMI 32.62 kg/m2  SpO2 97% Physical Exam  Constitutional: She is oriented to person, place, and time. She appears well-developed and well-nourished. No distress.  HENT:  Head: Normocephalic and atraumatic.  Mouth/Throat: Oropharynx is clear and moist.  Fluid behind left TM, mild drainage   Eyes: Conjunctivae and EOM are normal. Pupils are equal, round, and reactive to light.  No nystagmus   Neck: Neck supple. No tracheal deviation present.  No meningismus   Cardiovascular: Normal rate, regular rhythm and normal heart sounds.   Pulmonary/Chest: Effort normal. No respiratory distress.  Lungs clear  Abdominal: Soft. There is no tenderness.  Musculoskeletal: Normal range of motion.  No leg drift. Equal strength in both legs.  No arm drift  Finger nose intact  No facial drip Visual field intac  Neurological: She is alert and oriented to person, place, and time.  Normal gait  Skin: Skin is warm and dry.  Psychiatric: She has a normal mood and affect. Her behavior is normal.  Nursing note and vitals reviewed.   ED Course  Procedures (including critical care time) DIAGNOSTIC STUDIES: Oxygen Saturation is 100% on RA, normal by my interpretation.    COORDINATION OF CARE: 11:38 PM- Pt advised of plan for treatment and pt agrees.    Labs Review Labs Reviewed  CBC WITH DIFFERENTIAL - Abnormal; Notable for the following:    RBC 5.13 (*)    Hemoglobin 16.1 (*)    HCT 47.4 (*)    All other components within normal  limits  BASIC METABOLIC PANEL - Abnormal; Notable for the following:    Potassium 2.5 (*)    Glucose, Bld 109 (*)    GFR calc non Af Amer 89 (*)    All other components within normal limits  URINALYSIS, ROUTINE W REFLEX MICROSCOPIC - Abnormal; Notable for the following:    APPearance CLOUDY (*)    Bilirubin Urine SMALL (*)    Ketones, ur 40 (*)    Leukocytes, UA SMALL (*)    All other components within normal limits  URINE MICROSCOPIC-ADD ON - Abnormal; Notable for the following:    Squamous Epithelial / LPF FEW (*)  Bacteria, UA FEW (*)    All other components within normal limits  MAGNESIUM  TROPONIN I    Imaging Review Ct Head Wo Contrast  11/18/2014   CLINICAL DATA:  Nausea and vomiting for 2 days. Dizziness. Mild ear pain an buzz in the ear. Shaking of right hand.  EXAM: CT HEAD WITHOUT CONTRAST  TECHNIQUE: Contiguous axial images were obtained from the base of the skull through the vertex without intravenous contrast.  COMPARISON:  01/20/2010  FINDINGS: Diffuse cerebral atrophy. Ventricles are not dilated. Small extra-axial calcification along the inner table of the left frontal bone may represent a calcified meningioma. No change since prior study. No mass effect or midline shift. No abnormal extra-axial fluid collections. Gray-white matter junctions are distinct. Basal cisterns are not effaced. No evidence of acute intracranial hemorrhage. No depressed skull fractures. Visualized paranasal sinuses and mastoid air cells are not opacified.  IMPRESSION: No acute intracranial abnormalities.  Mild cerebral atrophy.   Electronically Signed   By: Lucienne Capers M.D.   On: 11/18/2014 00:28     EKG Interpretation None     EKG reviewed heart rate 76, sinus, nonspecific ST changes, no acute ST elevation, normal QT. MDM   Final diagnoses:  Vertigo  Non-intractable vomiting with nausea, vomiting of unspecified type  Hypokalemia  Dehydration    I personally performed the services  described in this documentation, which was scribed in my presence. The recorded information has been reviewed and is accurate.   Patient presents with vertigo, left ear effusion and dehydration. Potassium 2.5. IV and oral potassium given in the ER. IV fluids given nausea meds. Multiple rechecks, patient improved and not vomiting. Discussed transfer for observation/inpatient for IV fluids and potassium however patient prefers and comfortable with close outpatient follow-up with oral potassium. Patient will get rechecked in 2-3 days and will return if worsening symptoms. CT head results reviewed no acute findings. Patient has normal neurologic exam, very low suspicion for posterior stroke.  Results and differential diagnosis were discussed with the patient/parent/guardian. Close follow up outpatient was discussed, comfortable with the plan.   Medications  sodium chloride 0.9 % bolus 500 mL (0 mLs Intravenous Stopped 11/18/14 0050)  ondansetron (ZOFRAN) injection 4 mg (4 mg Intravenous Given 11/17/14 2359)  potassium chloride SA (K-DUR,KLOR-CON) CR tablet 40 mEq (40 mEq Oral Given 11/18/14 0045)  potassium chloride 10 mEq in 100 mL IVPB (0 mEq Intravenous Stopped 11/18/14 0200)  sodium chloride 0.9 % bolus 500 mL (0 mLs Intravenous Stopped 11/18/14 0200)    Filed Vitals:   11/17/14 2040 11/17/14 2339 11/18/14 0200  BP: 138/88 156/83 132/74  Pulse: 80 67 66  Temp: 99.9 F (37.7 C) 98.8 F (37.1 C)   TempSrc: Oral Oral   Resp: 18 16   Height: 5\' 5"  (1.651 m)    Weight: 196 lb (88.905 kg)    SpO2: 100% 97% 97%    Final diagnoses:  Vertigo  Non-intractable vomiting with nausea, vomiting of unspecified type  Hypokalemia  Dehydration  Left ear effusion    Mariea Clonts, MD 11/18/14 0710

## 2014-11-18 DIAGNOSIS — R112 Nausea with vomiting, unspecified: Secondary | ICD-10-CM | POA: Diagnosis not present

## 2014-11-18 LAB — CBC WITH DIFFERENTIAL/PLATELET
BASOS ABS: 0 10*3/uL (ref 0.0–0.1)
Basophils Relative: 1 % (ref 0–1)
EOS PCT: 1 % (ref 0–5)
Eosinophils Absolute: 0.1 10*3/uL (ref 0.0–0.7)
HEMATOCRIT: 47.4 % — AB (ref 36.0–46.0)
HEMOGLOBIN: 16.1 g/dL — AB (ref 12.0–15.0)
LYMPHS ABS: 1.9 10*3/uL (ref 0.7–4.0)
LYMPHS PCT: 32 % (ref 12–46)
MCH: 31.4 pg (ref 26.0–34.0)
MCHC: 34 g/dL (ref 30.0–36.0)
MCV: 92.4 fL (ref 78.0–100.0)
MONO ABS: 0.7 10*3/uL (ref 0.1–1.0)
Monocytes Relative: 11 % (ref 3–12)
Neutro Abs: 3.3 10*3/uL (ref 1.7–7.7)
Neutrophils Relative %: 56 % (ref 43–77)
PLATELETS: 206 10*3/uL (ref 150–400)
RBC: 5.13 MIL/uL — ABNORMAL HIGH (ref 3.87–5.11)
RDW: 12.7 % (ref 11.5–15.5)
WBC: 5.9 10*3/uL (ref 4.0–10.5)

## 2014-11-18 LAB — URINALYSIS, ROUTINE W REFLEX MICROSCOPIC
Glucose, UA: NEGATIVE mg/dL
HGB URINE DIPSTICK: NEGATIVE
Ketones, ur: 40 mg/dL — AB
Nitrite: NEGATIVE
Protein, ur: NEGATIVE mg/dL
SPECIFIC GRAVITY, URINE: 1.015 (ref 1.005–1.030)
Urobilinogen, UA: 1 mg/dL (ref 0.0–1.0)
pH: 6.5 (ref 5.0–8.0)

## 2014-11-18 LAB — URINE MICROSCOPIC-ADD ON

## 2014-11-18 LAB — MAGNESIUM: Magnesium: 2.3 mg/dL (ref 1.5–2.5)

## 2014-11-18 LAB — TROPONIN I

## 2014-11-18 MED ORDER — POTASSIUM CHLORIDE CRYS ER 20 MEQ PO TBCR
40.0000 meq | EXTENDED_RELEASE_TABLET | Freq: Once | ORAL | Status: AC
  Administered 2014-11-18: 40 meq via ORAL
  Filled 2014-11-18: qty 2

## 2014-11-18 MED ORDER — ONDANSETRON 4 MG PO TBDP: ORAL_TABLET | ORAL | Status: DC

## 2014-11-18 MED ORDER — POTASSIUM CHLORIDE CRYS ER 20 MEQ PO TBCR: 20.0000 meq | EXTENDED_RELEASE_TABLET | Freq: Every day | ORAL | Status: DC

## 2014-11-18 MED ORDER — SODIUM CHLORIDE 0.9 % IV BOLUS (SEPSIS)
500.0000 mL | Freq: Once | INTRAVENOUS | Status: AC
  Administered 2014-11-18: 500 mL via INTRAVENOUS

## 2014-11-18 MED ORDER — POTASSIUM CHLORIDE 10 MEQ/100ML IV SOLN
10.0000 meq | Freq: Once | INTRAVENOUS | Status: AC
  Administered 2014-11-18: 10 meq via INTRAVENOUS
  Filled 2014-11-18: qty 100

## 2014-11-18 MED ORDER — POTASSIUM CHLORIDE 10 MEQ/100ML IV SOLN
10.0000 meq | Freq: Once | INTRAVENOUS | Status: DC
  Filled 2014-11-18: qty 100

## 2014-11-18 NOTE — Discharge Instructions (Signed)
Have your electrolytes rechecked in 2 days. If you have worsening vomiting, or worsening symptoms return to the ER. If you were given medicines take as directed.  If you are on coumadin or contraceptives realize their levels and effectiveness is altered by many different medicines.  If you have any reaction (rash, tongues swelling, other) to the medicines stop taking and see a physician.   Please follow up as directed and return to the ER or see a physician for new or worsening symptoms.  Thank you. Filed Vitals:   11/17/14 2040 11/17/14 2339 11/18/14 0200  BP: 138/88 156/83 132/74  Pulse: 80 67 66  Temp: 99.9 F (37.7 C) 98.8 F (37.1 C)   TempSrc: Oral Oral   Resp: 18 16   Height: 5\' 5"  (1.651 m)    Weight: 196 lb (88.905 kg)    SpO2: 100% 97% 97%

## 2014-11-18 NOTE — ED Notes (Signed)
Lab called and states K level is 2.5. Will update MD

## 2014-11-19 LAB — BASIC METABOLIC PANEL
ANION GAP: 12 (ref 5–15)
BUN: 13 mg/dL (ref 6–23)
CALCIUM: 9.7 mg/dL (ref 8.4–10.5)
CO2: 25 mmol/L (ref 19–32)
CREATININE: 0.69 mg/dL (ref 0.50–1.10)
Chloride: 101 mEq/L (ref 96–112)
GFR, EST NON AFRICAN AMERICAN: 89 mL/min — AB (ref >90)
Glucose, Bld: 109 mg/dL — ABNORMAL HIGH (ref 70–99)
POTASSIUM: 2.5 mmol/L — AB (ref 3.5–5.1)
Sodium: 138 mmol/L (ref 135–145)

## 2015-08-30 DIAGNOSIS — K635 Polyp of colon: Secondary | ICD-10-CM | POA: Insufficient documentation

## 2015-11-28 DIAGNOSIS — M479 Spondylosis, unspecified: Secondary | ICD-10-CM | POA: Insufficient documentation

## 2015-12-31 ENCOUNTER — Ambulatory Visit: Payer: Medicare Other | Admitting: Physical Therapy

## 2016-01-07 ENCOUNTER — Ambulatory Visit: Payer: Medicare Other | Attending: Physical Medicine and Rehabilitation | Admitting: Physical Therapy

## 2016-01-07 DIAGNOSIS — R29898 Other symptoms and signs involving the musculoskeletal system: Secondary | ICD-10-CM | POA: Diagnosis present

## 2016-01-07 DIAGNOSIS — M5416 Radiculopathy, lumbar region: Secondary | ICD-10-CM

## 2016-01-07 DIAGNOSIS — R208 Other disturbances of skin sensation: Secondary | ICD-10-CM | POA: Insufficient documentation

## 2016-01-07 DIAGNOSIS — R293 Abnormal posture: Secondary | ICD-10-CM | POA: Insufficient documentation

## 2016-01-07 DIAGNOSIS — M5417 Radiculopathy, lumbosacral region: Secondary | ICD-10-CM | POA: Diagnosis not present

## 2016-01-07 DIAGNOSIS — M6281 Muscle weakness (generalized): Secondary | ICD-10-CM | POA: Diagnosis present

## 2016-01-07 DIAGNOSIS — R209 Unspecified disturbances of skin sensation: Secondary | ICD-10-CM

## 2016-01-07 NOTE — Therapy (Signed)
[facility] [place], [place], [address] Phone: [phone]   Fax:  [phone]  Physical Therapy Evaluation  Patient Details  Name: [name] MRN: [id number] Date of Birth: December 11, 1950 Referring Provider: Almyra Free   Encounter Date: 01/07/2016      PT End of Session - 01/07/16 1552    Visit Number 1   Number of Visits 16   Date for PT Re-Evaluation 03/03/16   PT Start Time 1500   PT Stop Time 1539   PT Time Calculation (min) 39 min   Activity Tolerance Patient tolerated treatment well   Behavior During Therapy [facility] for tasks assessed/performed      Past Medical History  Diagnosis Date  . Alzheimer's dementia   . Hypercholesteremia   . Depression   . Schizophrenia     Past Surgical History  Procedure Laterality Date  . Abdominal hysterectomy    . Carpal tunnel release    . Tumor removal    . Mandible surgery      There were no vitals filed for this visit.  Visit Diagnosis:  Lumbar back pain with radiculopathy affecting right lower extremity  Weakness of right lower extremity  Sensory disturbance  Poor posture  Weakness of back      Subjective Assessment - 01/07/16 1508    Subjective This patient presetns with longstanding low back pain which limits her ability to maintain her home.  She reports frequent falls, dizziness, denies weakness, Rt. sided sensory changes in Rt. LE.  She reports remote history of back pain (37yrs ago) while at work.     Patient is accompained by: Family member  Son drives    Pertinent History alzheimers dementia, anxiety, depression   How long can you sit comfortably? 45 min    How long can you stand comfortably? <30 min    How long can you walk comfortably? could walk 15- 20 min, incr leg pain and hip pain    Diagnostic tests none recent    Patient Stated Goals start walking again    Currently in Pain? Yes   Pain Score 7    Pain Location Back   Pain  Orientation Right;Lower   Pain Descriptors / Indicators Sore;Shooting   Pain Type Chronic pain   Pain Radiating Towards Rt. LE to foot    Pain Onset More than a month ago   Pain Frequency Intermittent   Aggravating Factors  standing still, walking dishes, vacuuming    Pain Relieving Factors changing position, meds (min relief), heat, massage    Effect of Pain on Daily Activities ADLs, comfort and ease of tasks   Multiple Pain Sites No            [facility] PT Assessment - 01/07/16 1516    Assessment   Medical Diagnosis chronic low back pain    Referring Provider Almyra Free    Onset Date/Surgical Date --  10 yrs    Next MD Visit 01/31/16   Prior Therapy None    Precautions   Precautions Fall   Restrictions   Weight Bearing Restrictions No   Balance Screen   Has the patient fallen in the past 6 months Yes   How many times? 4-5   Has the patient had a decrease in activity level because of a fear of falling?  Yes   Is the patient reluctant to leave their home because of a fear of falling?  Yes   Home Environment  Living Environment Private residence   Living Arrangements Children   Home Access Level entry   Prior Function   Level of Independence Independent with basic ADLs;Needs assistance with homemaking  son helps her with everything    Vocation On disability   Vocation Requirements used to sew, assembly line   Leisure relax with dogs, used to walk 5 miles a day, reading   reports anxiety limits her activity out of the house   Cognition   Overall Cognitive Status History of cognitive impairments - at baseline   Observation/Other Assessments   Focus on Therapeutic Outcomes (FOTO)  59%  goal 46%   Sensation   Light Touch Appears Intact   Additional Comments reports numbness Rt. LE post aspect   Coordination   Gross Motor Movements are Fluid and Coordinated Not tested   Posture/Postural Control   Posture/Postural Control Postural limitations   Postural Limitations  Rounded Shoulders;Forward head;Decreased lumbar lordosis;Increased thoracic kyphosis;Posterior pelvic tilt   AROM   AROM Assessment Site --  pain with all motions   Lumbar Flexion 40   Lumbar Extension 45   Lumbar - Right Side Bend 10   Lumbar - Left Side Bend 20   Lumbar - Right Rotation WFL    Lumbar - Left Rotation WFL    Strength   Right Hip Flexion 3+/5   Right Hip Extension 3-/5   Right Hip ABduction 3/5   Left Hip Flexion 4+/5   Left Hip Extension 3-/5   Left Hip ABduction 4+/5   Right Knee Flexion 4/5   Right Knee Extension 4/5   Left Knee Flexion 5/5   Left Knee Extension 5/5   Right/Left Ankle --  DF Rt. 4+/5, L 5/5    Flexibility   Soft Tissue Assessment /Muscle Length yes   Hamstrings 45 deg  AROM on Rt., Lt to 80 deg   Palpation   Palpation comment pain Rt. greater troch, into glute med and ITB   Straight Leg Raise   Findings Positive   Side  Right   Comment 30 deg pain             [facility] Adult PT Treatment/Exercise - 01/07/16 0001    Lumbar Exercises: Prone   Other Prone Lumbar Exercises prone on elbows 30 sec x 3      Self care: anatomy, spinal motions, posture and body mechanics, prone ext      PT Education - 01/07/16 1546    Education provided Yes   Education Details PT/POC, extension, posture and body mech   Person(s) Educated Patient   Methods Explanation;Demonstration;Verbal cues;Handout   Comprehension Verbalized understanding;Returned demonstration;Need further instruction          PT Short Term Goals - 01/07/16 1552    PT SHORT TERM GOAL #1   Title Pt will be able to demo correct posture and body mechanics for ADLs and basic home tasks.    Time 3   Period Weeks   Status New   PT SHORT TERM GOAL #2   Title Pt will be I with Initial HEP   Time 3   Period Weeks   Status New   PT SHORT TERM GOAL #3   Title Pt will be able to stand to do dishes with 25% less pain overall, up to 10-15 min    Time 3   Period Weeks   Status New    PT SHORT TERM GOAL #4   Title Berg balance screen completed and set goal.  Time 3   Period Weeks   Status New           PT Long Term Goals - 01-31-16 1806    PT LONG TERM GOAL #1   Title Pt will be able to walk for 30 min for exercise with min increase in leg pain    Time 8   Period Weeks   Status New   PT LONG TERM GOAL #2   Title Pt will be I with more advanced HEP   Time 8   Status New   PT LONG TERM GOAL #3   Title Pt will improve Rt. Leg strength in extension and abduction to 4/5 for improved gait and balance.    Time 8   Period Weeks   Status New   PT LONG TERM GOAL #4   Title Pt will be able to complete housework with min increase in pain overall (vacuum, laundry) with min A from her son.    Time 8   Period Weeks   Status New   PT LONG TERM GOAL #5   Title FOTO score will improve by 15% to demo improved functional mobility.    Time 8   Period Weeks   Status New   Additional Long Term Goals   Additional Long Term Goals Yes   PT LONG TERM GOAL #6   Title Pt will achieve balance goal based on findings TBA   Time 8   Period Weeks   Status New               Plan - January 31, 2016 1801    Clinical Impression Statement Pt with mod complexity eval for chronic low back pain, as well as sensory changes and Rt. LE weakness. She has had no recent imaging but seems to respond well to extension bias.  She would like to restore pain minimized PLOF which she states included walking up to 5 miles a day.  She is quite sedentary, mostly due to mental health issues.  She is a high fall risk, stating her Rt. leg gives out frequently.     Pt will benefit from skilled therapeutic intervention in order to improve on the following deficits Abnormal gait;Decreased range of motion;Increased fascial restricitons;Increased muscle spasms;Dizziness;Decreased endurance;Decreased activity tolerance;Pain;Improper body mechanics;Impaired flexibility;Decreased balance;Decreased  strength;Decreased mobility;Decreased cognition;Impaired sensation;Postural dysfunction   Rehab Potential Good   PT Frequency 2x / week  may only be able to come 1 time per week due to lack of transportation    PT Duration 8 weeks   PT Treatment/Interventions ADLs/Self Care Home Management;Cryotherapy;Electrical Stimulation;Moist Heat;Traction;Balance training;Manual techniques;Therapeutic exercise;Taping;Functional mobility training;Dry needling;Passive range of motion;Neuromuscular re-education;Ultrasound   PT Next Visit Plan begin prone stabilization if tolerated, hip and core strength, consider decompression of Rt. trunk/LE   PT Home Exercise Plan prone on elbows only today   Consulted and Agree with Plan of Care Patient          G-Codes - Jan 31, 2016 1817    Functional Assessment Tool Used FOTO   Functional Limitation Mobility: Walking and moving around   Mobility: Walking and Moving Around Current Status [phone]) At least 40 percent but less than 60 percent impaired, limited or restricted   Mobility: Walking and Moving Around Goal Status [phone]) At least 20 percent but less than 40 percent impaired, limited or restricted       Problem List There are no active problems to display for this patient.   Unika Nazareno 31-Jan-2016, 6:19 PM  [facility]  [place], [place], [address] Phone: [phone]   Fax:  [phone]  Name: AKAYSHA HANCOCK MRN: [id number] Date of Birth: Aug 06, 1951  Raeford Razor, PT 01/07/2016 6:20 PM Phone: [phone] Fax: [phone]

## 2016-01-07 NOTE — Patient Instructions (Addendum)
On Elbows (Prone)    Rise up on elbows as high as possible, keeping hips on floor. Hold _30___ seconds. Repeat ___3_ times per set. Do ___1_ sets per session. Do _2___ sessions per day.  http://orth.exer.us/93   Copyright  VHI. All rights reserved.    Sleeping on Back  Place pillow under knees. A pillow with cervical support and a roll around waist are also helpful. Copyright  VHI. All rights reserved.  Sleeping on Side Place pillow between knees. Use cervical support under neck and a roll around waist as needed. Copyright  VHI. All rights reserved.   Sleeping on Stomach   If this is the only desirable sleeping position, place pillow under lower legs, and under stomach or chest as needed.  Posture - Sitting   Sit upright, head facing forward. Try using a roll to support lower back. Keep shoulders relaxed, and avoid rounded back. Keep hips level with knees. Avoid crossing legs for long periods. Stand to Sit / Sit to Stand   To sit: Bend knees to lower self onto front edge of chair, then scoot back on seat. To stand: Reverse sequence by placing one foot forward, and scoot to front of seat. Use rocking motion to stand up.   Work Height and Reach  Ideal work height is no more than 2 to 4 inches below elbow level when standing, and at elbow level when sitting. Reaching should be limited to arm's length, with elbows slightly bent.  Bending  Bend at hips and knees, not back. Keep feet shoulder-width apart.    Posture - Standing   Good posture is important. Avoid slouching and forward head thrust. Maintain curve in low back and align ears over shoul- ders, hips over ankles.  Alternating Positions   Alternate tasks and change positions frequently to reduce fatigue and muscle tension. Take rest breaks. Computer Work   Position work to Programmer, multimedia. Use proper work and seat height. Keep shoulders back and down, wrists straight, and elbows at right angles. Use chair that  provides full back support. Add footrest and lumbar roll as needed.  Getting Into / Out of Car  Lower self onto seat, scoot back, then bring in one leg at a time. Reverse sequence to get out.  Dressing  Lie on back to pull socks or slacks over feet, or sit and bend leg while keeping back straight.    Housework - Sink  Place one foot on ledge of cabinet under sink when standing at sink for prolonged periods.   Pushing / Pulling  Pushing is preferable to pulling. Keep back in proper alignment, and use leg muscles to do the work.  Deep Squat   Squat and lift with both arms held against upper trunk. Tighten stomach muscles without holding breath. Use smooth movements to avoid jerking.  Avoid Twisting   Avoid twisting or bending back. Pivot around using foot movements, and bend at knees if needed when reaching for articles.  Carrying Luggage   Distribute weight evenly on both sides. Use a cart whenever possible. Do not twist trunk. Move body as a unit.   Lifting Principles .Maintain proper posture and head alignment. .Slide object as close as possible before lifting. .Move obstacles out of the way. .Test before lifting; ask for help if too heavy. .Tighten stomach muscles without holding breath. .Use smooth movements; do not jerk. .Use legs to do the work, and pivot with feet. .Distribute the work load symmetrically and close to the center of  trunk. .Push instead of pull whenever possible.   Ask For Help   Ask for help and delegate to others when possible. Coordinate your movements when lifting together, and maintain the low back curve.  Log Roll   Lying on back, bend left knee and place left arm across chest. Roll all in one movement to the right. Reverse to roll to the left. Always move as one unit. Housework - Sweeping  Use long-handled equipment to avoid stooping.   Housework - Wiping  Position yourself as close as possible to reach work surface. Avoid  straining your back.  Laundry - Unloading Wash   To unload small items at bottom of washer, lift leg opposite to arm being used to reach.  [place] close to area to be raked. Use arm movements to do the work. Keep back straight and avoid twisting.     Cart  When reaching into cart with one arm, lift opposite leg to keep back straight.   Getting Into / Out of Bed  Lower self to lie down on one side by raising legs and lowering head at the same time. Use arms to assist moving without twisting. Bend both knees to roll onto back if desired. To sit up, start from lying on side, and use same move-ments in reverse. Housework - Vacuuming  Hold the vacuum with arm held at side. Step back and forth to move it, keeping head up. Avoid twisting.   Laundry - IT consultant so that bending and twisting can be avoided.   Laundry - Unloading Dryer  Squat down to reach into clothes dryer or use a reacher.  Gardening - Weeding / Probation officer or Kneel. Knee pads may be helpful.

## 2016-01-13 ENCOUNTER — Ambulatory Visit: Payer: Medicare Other | Attending: Physical Medicine and Rehabilitation

## 2016-01-13 DIAGNOSIS — M5417 Radiculopathy, lumbosacral region: Secondary | ICD-10-CM | POA: Insufficient documentation

## 2016-01-13 DIAGNOSIS — M6281 Muscle weakness (generalized): Secondary | ICD-10-CM | POA: Insufficient documentation

## 2016-01-13 DIAGNOSIS — R29898 Other symptoms and signs involving the musculoskeletal system: Secondary | ICD-10-CM | POA: Insufficient documentation

## 2016-01-13 DIAGNOSIS — R293 Abnormal posture: Secondary | ICD-10-CM | POA: Insufficient documentation

## 2016-01-13 DIAGNOSIS — R208 Other disturbances of skin sensation: Secondary | ICD-10-CM | POA: Insufficient documentation

## 2016-01-20 ENCOUNTER — Ambulatory Visit: Payer: Medicare Other

## 2016-01-27 ENCOUNTER — Ambulatory Visit: Payer: Medicare Other | Admitting: Physical Therapy

## 2016-01-27 DIAGNOSIS — R293 Abnormal posture: Secondary | ICD-10-CM

## 2016-01-27 DIAGNOSIS — M5416 Radiculopathy, lumbar region: Secondary | ICD-10-CM

## 2016-01-27 DIAGNOSIS — R29898 Other symptoms and signs involving the musculoskeletal system: Secondary | ICD-10-CM

## 2016-01-27 DIAGNOSIS — R209 Unspecified disturbances of skin sensation: Secondary | ICD-10-CM

## 2016-01-27 DIAGNOSIS — M6281 Muscle weakness (generalized): Secondary | ICD-10-CM | POA: Diagnosis present

## 2016-01-27 DIAGNOSIS — M5417 Radiculopathy, lumbosacral region: Secondary | ICD-10-CM | POA: Diagnosis not present

## 2016-01-27 DIAGNOSIS — R208 Other disturbances of skin sensation: Secondary | ICD-10-CM | POA: Diagnosis present

## 2016-01-27 NOTE — Therapy (Addendum)
[place], [place], [address] Phone: [phone]   Fax:  [phone]  Physical Therapy Treatment  Patient Details  Name: [name] MRN: [id number] Date of Birth: 12-24-1950 Referring Provider: Almyra Free   Encounter Date: 01/27/2016      PT End of Session - 01/27/16 1552    Visit Number 2   Number of Visits 16   Date for PT Re-Evaluation 03/03/16   PT Start Time 0345   PT Stop Time 0430   PT Time Calculation (min) 45 min      Past Medical History  Diagnosis Date  . Alzheimer's dementia   . Hypercholesteremia   . Depression   . Schizophrenia     Past Surgical History  Procedure Laterality Date  . Abdominal hysterectomy    . Carpal tunnel release    . Tumor removal    . Mandible surgery      There were no vitals filed for this visit.  Visit Diagnosis:  Lumbar back pain with radiculopathy affecting right lower extremity  Weakness of right lower extremity  Sensory disturbance  Poor posture  Weakness of back      Subjective Assessment - 01/27/16 1547    Subjective I fell on saturday. It felt like my right leg just gave away. My pain has been worse. Right Low back and leg.    Currently in Pain? Yes   Pain Score 7    Pain Location Back   Pain Orientation Right            [facility] PT Assessment - 01/27/16 0001    Standardized Balance Assessment   Standardized Balance Assessment Berg Balance Test   Berg Balance Test   Sit to Stand Able to stand without using hands and stabilize independently   Standing Unsupported Able to stand 2 minutes with supervision   Sitting with Back Unsupported but Feet Supported on Floor or Stool Able to sit safely and securely 2 minutes   Stand to Sit Sits safely with minimal use of hands   Transfers Able to transfer safely, minor use of hands   Standing Unsupported with Eyes Closed Able to stand 10 seconds with supervision   Standing  Ubsupported with Feet Together Able to place feet together independently and stand for 1 minute with supervision   From Standing, Reach Forward with Outstretched Arm Can reach forward >5 cm safely (2")  pain   From Standing Position, Pick up Object from Floor Able to pick up shoe, needs supervision   From Standing Position, Turn to Look Behind Over each Shoulder Looks behind one side only/other side shows less weight shift   Turn 360 Degrees Needs close supervision or verbal cueing   Standing Unsupported, Alternately Place Feet on Step/Stool Able to complete 4 steps without aid or supervision  needs supervision/CGA   Standing Unsupported, One Foot in Front Able to plae foot ahead of the other independently and hold 30 seconds   Standing on One Leg Able to lift leg independently and hold 5-10 seconds   Total Score 42                     [facility] Adult PT Treatment/Exercise - 01/27/16 0001    Lumbar Exercises: Stretches   Single Knee to Chest Stretch 3 reps;30 seconds   Lower Trunk Rotation 3 reps;30 seconds   Lower Trunk Rotation Limitations cues to keep comfortable   Piriformis Stretch 3 reps;30 seconds  Piriformis Stretch Limitations seated   Lumbar Exercises: Prone   Other Prone Lumbar Exercises prone on elbows 30 sec x 3 , prone lying  with active release of right piriformis IR/ER PROM of hip   Other Prone Lumbar Exercises Prone Tra activation with hip IR/ER x 10, then 10 hamstring curls each leg                PT Education - 01/27/16 1559    Education provided Yes   Education Details knee to chest, lower trunk rotation, seated figure four, prone Tra activation with h/s curls   Person(s) Educated Patient   Methods Explanation;Handout   Comprehension Verbalized understanding          PT Short Term Goals - 01/27/16 1637    PT SHORT TERM GOAL #1   Title Pt will be able to demo correct posture and body mechanics for ADLs and basic home tasks.    Time 3    Period Weeks   Status On-going   PT SHORT TERM GOAL #2   Title Pt will be I with Initial HEP   Time 3   Period Weeks   Status On-going   PT SHORT TERM GOAL #3   Title Pt will be able to stand to do dishes with 25% less pain overall, up to 10-15 min    Time 3   Period Weeks   Status On-going   PT SHORT TERM GOAL #4   Title Berg balance screen completed and set goal.    Time 3   Period Weeks   Status Partially Met           PT Long Term Goals - 01/07/16 1806    PT LONG TERM GOAL #1   Title Pt will be able to walk for 30 min for exercise with min increase in leg pain    Time 8   Period Weeks   Status New   PT LONG TERM GOAL #2   Title Pt will be I with more advanced HEP   Time 8   Status New   PT LONG TERM GOAL #3   Title Pt will improve Rt. Leg strength in extension and abduction to 4/5 for improved gait and balance.    Time 8   Period Weeks   Status New   PT LONG TERM GOAL #4   Title Pt will be able to complete housework with min increase in pain overall (vacuum, laundry) with min A from her son.    Time 8   Period Weeks   Status New   PT LONG TERM GOAL #5   Title FOTO score will improve by 15% to demo improved functional mobility.    Time 8   Period Weeks   Status New   Additional Long Term Goals   Additional Long Term Goals Yes   PT LONG TERM GOAL #6   Title Pt will achieve balance goal based on findings TBA   Time 8   Period Weeks   Status New            G-Codes - 05-21-2016 1458    Functional Assessment Tool Used FOTO   Functional Limitation Mobility: Walking and moving around   Mobility: Walking and Moving Around Current Status (B1517) At least 40 percent but less than 60 percent impaired, limited or restricted   Mobility: Walking and Moving Around Goal Status (O1607) At least 20 percent but less than 40 percent impaired, limited or restricted  Mobility: Walking and Moving Around Discharge Status [phone]) At least 40 percent but less than 60 percent  impaired, limited or restricted             Plan - 01/27/16 1634    Clinical Impression Statement 42/56 BERG score. Encouraged SPC use in and out of home. Pt is reluctant however understands her fall risk. Asked pt to bring [facility] next vist for adjustment if needed and wrote it on her HEP due to memory loss. Pt instructed in trunk mobility exercises as well as progression to prone stabilization. Pt reports pain decreased from 7/10 to 5/10 post treatment.    PT Next Visit Plan check gait with SPC, review prone stabilization as progress as able, hip strength, consider decompression of Rt. trunk/LE, Set Berg Goals, Give very specific HEP due to Alzheimer's Disease.         Problem List There are no active problems to display for this patient.   Dorene Ar, [place] 01/27/2016, 4:41 PM  [place], [place], [address] Phone: [phone]   Fax:  [phone]  Name: [name] MRN: [id number] Date of Birth: [birth year]    PHYSICAL THERAPY DISCHARGE SUMMARY  Visits from Start of Care: 2 Current functional level related to goals / functional outcomes: Unknown   Remaining deficits: UNknown   Education / Equipment: Gave patient info on transportation for therapy appts, HEP and posture Plan: Patient agrees to discharge.  Patient goals were not met. Patient is being discharged due to not returning since the last visit.  ?????    Pt reports she needs transportation services due to her dementia, she was unsafe to drive.  She was going to work on getting assistance and return if needed, but she has not called or returned.  Raeford Razor, PT 05/13/2016 3:00 PM Phone: [phone] Fax: [phone]

## 2016-01-27 NOTE — Patient Instructions (Addendum)
  Copyright  VHI. All rights reserved.  Lower Trunk Rotation Stretch   Keeping back flat and feet together, rotate knees to left side. Hold ___30_ seconds. Repeat __3_ times per set. . Do __2__ sessions per day.  Knee to Chest (Flexion)    Pull knee toward chest. Feel stretch in lower back or buttock area. Breathing deeply, Hold _30___ seconds. Repeat with other knee. Repeat _3___ times. Do _2___ sessions per day.  http://gt2.exer.us/226   Copyright  VHI. All rights reserved.   Outer Hip Stretch: Figure Four (Chair)    Place right ankle on left knee. Lean forward and press down on knee to increase stretch. You should feel it in your hip.  Hold for ____ 30 seconds.  Repeat __3__ times each leg.  Copyright  VHI. All rights reserved.

## 2016-02-03 ENCOUNTER — Ambulatory Visit: Payer: Medicare Other | Admitting: Physical Therapy

## 2016-02-11 ENCOUNTER — Encounter: Payer: Medicare Other | Admitting: Physical Therapy

## 2016-02-24 ENCOUNTER — Ambulatory Visit: Payer: Medicare Other | Attending: Physical Medicine and Rehabilitation

## 2016-02-27 ENCOUNTER — Telehealth: Payer: Self-pay | Admitting: Physical Therapy

## 2016-02-27 ENCOUNTER — Ambulatory Visit: Payer: Medicare Other | Admitting: Physical Therapy

## 2016-02-27 NOTE — Telephone Encounter (Signed)
Called patient regarding her missed appt today, she states she forgot. She also reported she has difficulty obtaining transportation to her appts, as she is not able to drive secondary to dementia.  I offered she contact [facility] and she said she has been told she qualifies for that.  I gave her the number and she plans to call, contact her MD for a new referral for PT if needed.  Due to her poor attendance record, I asked that she get a new referral before re-scheduling her Re-Evaluation.  She was appreciative and agreeable.

## 2016-05-11 ENCOUNTER — Other Ambulatory Visit: Payer: Self-pay | Admitting: Internal Medicine

## 2016-05-11 DIAGNOSIS — Z1231 Encounter for screening mammogram for malignant neoplasm of breast: Secondary | ICD-10-CM

## 2016-05-18 ENCOUNTER — Ambulatory Visit
Admission: RE | Admit: 2016-05-18 | Discharge: 2016-05-18 | Disposition: A | Discharge status: Home / Self Care | Payer: Medicare Other | Source: Ambulatory Visit | Attending: Internal Medicine | Admitting: Internal Medicine

## 2016-05-18 ENCOUNTER — Other Ambulatory Visit: Payer: Self-pay | Admitting: Internal Medicine

## 2016-05-18 DIAGNOSIS — Z1231 Encounter for screening mammogram for malignant neoplasm of breast: Secondary | ICD-10-CM

## 2017-12-31 ENCOUNTER — Ambulatory Visit: Payer: Medicare Other | Admitting: Family Medicine

## 2018-01-06 ENCOUNTER — Ambulatory Visit: Payer: Medicare Other | Admitting: Family Medicine

## 2018-01-17 ENCOUNTER — Ambulatory Visit: Payer: Medicare Other | Admitting: Family Medicine

## 2018-02-11 ENCOUNTER — Ambulatory Visit: Payer: Medicare Other | Admitting: Family Medicine

## 2018-02-11 NOTE — Progress Notes (Deleted)
   Subjective:    Patient ID: [name], female    DOB: 04/25/1951, 67 y.o.   MRN: [id number]   CC:  HPI:    ROS   There are no active problems to display for this patient.    No family history on file.  Past Medical History:  Diagnosis Date  . Alzheimer's dementia   . Depression   . Hypercholesteremia   . Schizophrenia     Social Hx: Denies tobacco use, alcohol use, or illicit drug use.***  Objective:  There were no vitals taken for this visit. Vitals and nursing note reviewed  General: NAD, pleasant Head: Atraumatic Neck: Supple Cardiac: RRR, normal heart sounds, no murmurs Respiratory: CTAB, normal effort Abdomen: soft, nontender, nondistended. Bowel sounds present Extremities: no edema or cyanosis. WWP. MSK: normal gait Skin: warm and dry, no rashes noted Neuro: alert and oriented, no focal deficits Psych: Neatly groomed and appropriately dressed. Maintains good eye contact and is cooperative and attentive. Speech is normal volume and rate. Denies SI/ HI. Normal affect.  Assessment & Plan:    No problem-specific Assessment & Plan notes found for this encounter.   [place] Ammarie Matsuura, DO Family Medicine Resident, PGY-1

## 2020-05-08 ENCOUNTER — Encounter (HOSPITAL_COMMUNITY): Payer: Self-pay | Admitting: Emergency Medicine

## 2020-05-08 ENCOUNTER — Other Ambulatory Visit: Payer: Self-pay

## 2020-05-08 ENCOUNTER — Ambulatory Visit (HOSPITAL_COMMUNITY)
Admission: EM | Admit: 2020-05-08 | Discharge: 2020-05-08 | Disposition: A | Discharge status: Home / Self Care | Payer: Medicare Other | Attending: Internal Medicine | Admitting: Internal Medicine

## 2020-05-08 DIAGNOSIS — R21 Rash and other nonspecific skin eruption: Secondary | ICD-10-CM | POA: Diagnosis present

## 2020-05-08 DIAGNOSIS — M79606 Pain in leg, unspecified: Secondary | ICD-10-CM | POA: Diagnosis not present

## 2020-05-08 LAB — CBC
HCT: 39.6 % (ref 36.0–46.0)
Hemoglobin: 11.9 g/dL — ABNORMAL LOW (ref 12.0–15.0)
MCH: 25.2 pg — ABNORMAL LOW (ref 26.0–34.0)
MCHC: 30.1 g/dL (ref 30.0–36.0)
MCV: 83.7 fL (ref 80.0–100.0)
Platelets: 266 10*3/uL (ref 150–400)
RBC: 4.73 MIL/uL (ref 3.87–5.11)
RDW: 15.7 % — ABNORMAL HIGH (ref 11.5–15.5)
WBC: 3.4 10*3/uL — ABNORMAL LOW (ref 4.0–10.5)
nRBC: 0 % (ref 0.0–0.2)

## 2020-05-08 MED ORDER — VALACYCLOVIR HCL 1 G PO TABS: 1000.0000 mg | ORAL_TABLET | Freq: Three times a day (TID) | ORAL | 0 refills | Status: AC

## 2020-05-08 NOTE — ED Triage Notes (Signed)
Pt sts right lower leg pain x 2 weeks; denies obvious injury or swelling; pt sts increased pain with movement

## 2020-05-08 NOTE — Discharge Instructions (Signed)
Return for any increased pain, swelling and or redness. Take Valtrex until gone.

## 2020-05-09 ENCOUNTER — Encounter (HOSPITAL_BASED_OUTPATIENT_CLINIC_OR_DEPARTMENT_OTHER): Payer: Self-pay

## 2020-05-09 ENCOUNTER — Emergency Department (HOSPITAL_BASED_OUTPATIENT_CLINIC_OR_DEPARTMENT_OTHER): Payer: Medicare Other

## 2020-05-09 ENCOUNTER — Emergency Department (HOSPITAL_BASED_OUTPATIENT_CLINIC_OR_DEPARTMENT_OTHER)
Admission: EM | Admit: 2020-05-09 | Discharge: 2020-05-09 | Disposition: A | Discharge status: Home / Self Care | Payer: Medicare Other | Attending: Emergency Medicine | Admitting: Emergency Medicine

## 2020-05-09 ENCOUNTER — Other Ambulatory Visit: Payer: Self-pay

## 2020-05-09 DIAGNOSIS — M25561 Pain in right knee: Secondary | ICD-10-CM | POA: Diagnosis present

## 2020-05-09 DIAGNOSIS — E7800 Pure hypercholesterolemia, unspecified: Secondary | ICD-10-CM | POA: Insufficient documentation

## 2020-05-09 DIAGNOSIS — Z7982 Long term (current) use of aspirin: Secondary | ICD-10-CM | POA: Insufficient documentation

## 2020-05-09 NOTE — ED Provider Notes (Signed)
[place]    CSN: [id number] Arrival date & time: 05/08/20  1449      History   Chief Complaint Chief Complaint  Patient presents with  . Leg Pain    HPI [name] is a 69 y.o. female presents today with complaints of right leg pain x2 weeks.  Patient states pain is constant, worse with movement and "burning" in nature.  She denies any trauma or injury to area.  Patient states area of redness around knee began at the same time as pain.  Patient denies any recent fever or chills, no shortness of breath, abdominal pain, N/V/D.  Patient has taken her previously prescribed oxycodone for pain with no relief.  She denies any other suspicious lesions on body.    Past Medical History:  Diagnosis Date  . Alzheimer's dementia ([name])   . Depression   . Hypercholesteremia   . Schizophrenia ([place])     There are no problems to display for this patient.   Past Surgical History:  Procedure Laterality Date  . ABDOMINAL HYSTERECTOMY    . CARPAL TUNNEL RELEASE    . MANDIBLE SURGERY    . TUMOR REMOVAL      OB History   No obstetric history on file.      Home Medications    Prior to Admission medications   Medication Sig Start Date End Date Taking? Authorizing Provider  amoxicillin-clavulanate (AUGMENTIN) 875-125 MG per tablet Take 1 tablet by mouth every 12 (twelve) hours. Patient not taking: Reported on 05/08/2020 11/18/12   Etta Quill, NP  aspirin EC 81 MG tablet Take 81 mg by mouth every morning.     [provider]  benztropine (COGENTIN) 1 MG tablet Take 1 mg by mouth 2 (two) times daily.     [provider]  BIOTIN PO Take 1 tablet by mouth every morning.     [provider]  buPROPion (WELLBUTRIN XL) 300 MG 24 hr tablet Take 300 mg by mouth every morning.     [provider]  citalopram (CELEXA) 40 MG tablet Take 40 mg by mouth every morning.     [provider]  clonazePAM (KLONOPIN) 1 MG tablet Take 1 mg by  mouth 3 (three) times daily.    [provider]  erythromycin ophthalmic ointment Place a 1/2 inch ribbon of ointment into the lower eyelid 4 times per day for the next 3 days 05/24/14   Sherwood Gambler, MD  EVENING PRIMROSE OIL PO Take 1 capsule by mouth 2 (two) times daily. Reported on 01/07/2016    [provider]  fish oil-omega-3 fatty acids 1000 MG capsule Take 1 g by mouth 2 (two) times daily.     [provider]  HYDROcodone-acetaminophen (NORCO) 5-325 MG per tablet Take 1-2 tablets by mouth every 4 (four) hours as needed. Patient not taking: Reported on 05/08/2020 05/24/14   Sherwood Gambler, MD  HYDROcodone-acetaminophen (NORCO/VICODIN) 5-325 MG per tablet Take 1 tablet by mouth every 6 (six) hours as needed for pain. Patient not taking: Reported on 05/08/2020 11/18/12   Etta Quill, NP  HYDROcodone-acetaminophen (NORCO/VICODIN) 5-325 MG per tablet Take 2 tablets by mouth every 4 (four) hours as needed for pain. 11/22/12   Glendell Docker, NP  nystatin-triamcinolone (MYCOLOG) ointment Apply 1 application topically 3 (three) times daily as needed. For rash on arm    [provider]  omeprazole (PRILOSEC) 40 MG capsule Take 40 mg by mouth every morning.  [provider]  ondansetron (ZOFRAN ODT) 4 MG disintegrating tablet 4mg  ODT q4 hours prn nausea/vomit 11/18/14   [name], MD  potassium chloride SA (K-DUR,KLOR-CON) 20 MEQ tablet Take 1 tablet (20 mEq total) by mouth daily. 11/18/14   [name], MD  QUEtiapine (SEROQUEL XR) 300 MG 24 hr tablet Take 600 mg by mouth at bedtime.     [provider]  simvastatin (ZOCOR) 80 MG tablet Take 80 mg by mouth at bedtime.     [provider]  valACYclovir (VALTREX) 1000 MG tablet Take 1 tablet (1,000 mg total) by mouth 3 (three) times daily for 7 days. 05/08/20 05/15/20  Rudolpho Sevin, NP  zolpidem (AMBIEN) 5 MG tablet Take 5 mg by mouth at bedtime. For sleep    [provider]    Family History History reviewed. No pertinent family history.  Social History Social History   Tobacco Use  . Smoking status: Never Smoker  . Smokeless tobacco: Never Used  Vaping Use  . Vaping Use: Never used  Substance Use Topics  . Alcohol use: No  . Drug use: No     Allergies   Patient has no known allergies.   Review of Systems Dated in HPI otherwise negative   Physical Exam Triage Vital Signs ED Triage Vitals [05/08/20 1621]  Enc Vitals Group     BP (!) 124/94     Pulse Rate 68     Resp 18     Temp 98.1 F (36.7 C)     Temp Source Oral     SpO2 100 %     Weight      Height      Head Circumference      Peak Flow      Pain Score 6     Pain Loc      Pain Edu?      Excl. in [place]?    No data found.  Updated Vital Signs BP (!) 124/94 (BP Location: Left Arm)   Pulse 68   Temp 98.1 F (36.7 C) (Oral)   Resp 18   SpO2 100%       Physical Exam Constitutional:      General: She is not in acute distress.    Appearance: Normal appearance. She is not ill-appearing.  Musculoskeletal:     Comments: Patient with nonraised rash above knee with 2 seemingly older lesions just above this rash.  Rash/erythema slightly tender to palpation, not warm to touch.  Pain with active and passive motion.  No decreased range of motion  Skin:    General: Skin is warm and dry.  Neurological:     Mental Status: She is alert.        UC Treatments / Results  Labs (all labs ordered are listed, but only abnormal results are displayed) Labs Reviewed  CBC - Abnormal; Notable for the following components:      Result Value   WBC 3.4 (*)    Hemoglobin 11.9 (*)    MCH 25.2 (*)    RDW 15.7 (*)    All other components within normal limits    EKG   Radiology DG Knee Complete 4 Views Right  Result Date: 05/09/2020 CLINICAL DATA:  Fall, injury to right knee EXAM: RIGHT KNEE - COMPLETE 4+ VIEW COMPARISON:  None. FINDINGS: No evidence of fracture, dislocation, or  joint effusion. No evidence of arthropathy or other focal bone abnormality. Soft tissues are unremarkable. IMPRESSION: Negative. Electronically Signed  By: Rolm Baptise M.D.   On: 05/09/2020 15:57    Procedures Procedures (including critical care time)  Medications Ordered in UC Medications - No data to display  Initial Impression / Assessment and Plan / UC Course  I have reviewed the triage vital signs and the nursing notes.  Pertinent labs & imaging results that were available during my care of the patient were reviewed by me and considered in my medical decision making (see chart for details).  Right knee pain c rash -Unclear etiology of knee pain and if related to rash -Low suspicion for septic joint based on exam, no known injury or trauma to affected area -Consider autoimmune, less likely would be a herpetic type rash with pain.  Will treat with Valtrex as this will be low risk -Patient to follow-up with PCP for further work-up  Final Clinical Impressions(s) / UC Diagnoses   Final diagnoses:  Rash and nonspecific skin eruption     Discharge Instructions     Return for any increased pain, swelling and or redness. Take Valtrex until gone.    ED Prescriptions    Medication Sig Dispense Auth. Provider   valACYclovir (VALTREX) 1000 MG tablet Take 1 tablet (1,000 mg total) by mouth 3 (three) times daily for 7 days. 21 tablet Rudolpho Sevin, NP     PDMP not reviewed this encounter.   Rudolpho Sevin, NP 05/09/20 1950

## 2020-05-09 NOTE — ED Triage Notes (Addendum)
Pt states she fell/injured right knee 2 weeks ago-NAD-slow gait-pt states she was at pain management last week with no exam of knee-seen at [facility] 6/30-reports no xray done

## 2020-05-09 NOTE — Discharge Instructions (Addendum)
Your xray today showed no fracture or dislocation.   I have provided a referral for Orthopedics, Dr. Lucia Gaskins please schedule an appointment for further evaluation of your right knee pain.

## 2020-05-09 NOTE — ED Provider Notes (Signed)
[place] EMERGENCY DEPARTMENT Provider Note   CSN: [id number] Arrival date & time: 05/09/20  1510     History Chief Complaint  Patient presents with  . Knee Injury    [name] is a 69 y.o. female.  69 y.o female with a PMH of ALZ, hernia, depression presents to the ED with a chief complaint of right knee pain x2 weeks.  She describes this as constant stabbing pain above the right leg with radiation to the back.  Reports his pain is exacerbated with ambulation, somewhat alleviated with no movement.  She reports taking hydrocodone for pain control without improvement in her symptoms. There has been no trauma to her knee. She denies any trauma, no calf tenderness.   The history is provided by the patient and medical records.       Past Medical History:  Diagnosis Date  . Alzheimer's dementia ([place])   . Depression   . Hypercholesteremia   . Schizophrenia ([place])     There are no problems to display for this patient.   Past Surgical History:  Procedure Laterality Date  . ABDOMINAL HYSTERECTOMY    . CARPAL TUNNEL RELEASE    . MANDIBLE SURGERY    . TUMOR REMOVAL       OB History   No obstetric history on file.     No family history on file.  Social History   Tobacco Use  . Smoking status: Never Smoker  . Smokeless tobacco: Never Used  Vaping Use  . Vaping Use: Never used  Substance Use Topics  . Alcohol use: No  . Drug use: No    Home Medications Prior to Admission medications   Medication Sig Start Date End Date Taking? Authorizing Provider  amoxicillin-clavulanate (AUGMENTIN) 875-125 MG per tablet Take 1 tablet by mouth every 12 (twelve) hours. Patient not taking: Reported on 05/08/2020 11/18/12   Etta Quill, NP  aspirin EC 81 MG tablet Take 81 mg by mouth every morning.     [provider]  benztropine (COGENTIN) 1 MG tablet Take 1 mg by mouth 2 (two) times daily.     [provider]  BIOTIN PO Take 1 tablet by mouth  every morning.     [provider]  buPROPion (WELLBUTRIN XL) 300 MG 24 hr tablet Take 300 mg by mouth every morning.     [provider]  citalopram (CELEXA) 40 MG tablet Take 40 mg by mouth every morning.     [provider]  clonazePAM (KLONOPIN) 1 MG tablet Take 1 mg by mouth 3 (three) times daily.    [provider]  erythromycin ophthalmic ointment Place a 1/2 inch ribbon of ointment into the lower eyelid 4 times per day for the next 3 days 05/24/14   Sherwood Gambler, MD  EVENING PRIMROSE OIL PO Take 1 capsule by mouth 2 (two) times daily. Reported on 01/07/2016    [provider]  fish oil-omega-3 fatty acids 1000 MG capsule Take 1 g by mouth 2 (two) times daily.     [provider]  HYDROcodone-acetaminophen (NORCO) 5-325 MG per tablet Take 1-2 tablets by mouth every 4 (four) hours as needed. Patient not taking: Reported on 05/08/2020 05/24/14   Sherwood Gambler, MD  HYDROcodone-acetaminophen (NORCO/VICODIN) 5-325 MG per tablet Take 1 tablet by mouth every 6 (six) hours as needed for pain. Patient not taking: Reported on 05/08/2020 11/18/12   Etta Quill, NP  HYDROcodone-acetaminophen (NORCO/VICODIN) 5-325 MG per tablet Take 2  tablets by mouth every 4 (four) hours as needed for pain. 11/22/12   Glendell Docker, NP  nystatin-triamcinolone (MYCOLOG) ointment Apply 1 application topically 3 (three) times daily as needed. For rash on arm    [provider]  omeprazole (PRILOSEC) 40 MG capsule Take 40 mg by mouth every morning.     [provider]  ondansetron (ZOFRAN ODT) 4 MG disintegrating tablet 4mg  ODT q4 hours prn nausea/vomit 11/18/14   [name], MD  potassium chloride SA (K-DUR,KLOR-CON) 20 MEQ tablet Take 1 tablet (20 mEq total) by mouth daily. 11/18/14   [name], MD  QUEtiapine (SEROQUEL XR) 300 MG 24 hr tablet Take 600 mg by mouth at bedtime.     [provider]  simvastatin (ZOCOR) 80 MG tablet Take  80 mg by mouth at bedtime.     [provider]  valACYclovir (VALTREX) 1000 MG tablet Take 1 tablet (1,000 mg total) by mouth 3 (three) times daily for 7 days. 05/08/20 05/15/20  Rudolpho Sevin, NP  zolpidem (AMBIEN) 5 MG tablet Take 5 mg by mouth at bedtime. For sleep    [provider]    Allergies    Patient has no known allergies.  Review of Systems   Review of Systems  Constitutional: Negative for fever.  Musculoskeletal: Positive for arthralgias.    Physical Exam Updated Vital Signs BP 132/71 (BP Location: Left Arm)   Pulse 89   Temp 98.7 F (37.1 C) (Oral)   Resp 18   Ht 5\' 5"  (1.651 m)   Wt 92.5 kg   SpO2 96%   BMI 33.95 kg/m   Physical Exam Vitals and nursing note reviewed.  Constitutional:      Appearance: Normal appearance. She is not ill-appearing or toxic-appearing.  HENT:     Head: Normocephalic and atraumatic.     Nose: Nose normal.  Eyes:     Pupils: Pupils are equal, round, and reactive to light.  Cardiovascular:     Rate and Rhythm: Normal rate.  Pulmonary:     Effort: Pulmonary effort is normal.  Abdominal:     General: Abdomen is flat.  Musculoskeletal:     Cervical back: Normal range of motion and neck supple.     Right knee: Effusion present. No erythema, ecchymosis or lacerations. Normal range of motion. Tenderness present over the patellar tendon. No PCL tenderness. Normal alignment. Normal pulse.       Legs:     Comments: Pulses present, capillary refill intact, no erythema, mild knee effusion noted. Full ROM.   Skin:    General: Skin is warm and dry.  Neurological:     Mental Status: She is alert and oriented to person, place, and time.     ED Results / Procedures / Treatments   Labs (all labs ordered are listed, but only abnormal results are displayed) Labs Reviewed - No data to display  EKG None  Radiology DG Knee Complete 4 Views Right  Result Date: 05/09/2020 CLINICAL DATA:  Fall, injury to right knee EXAM:  RIGHT KNEE - COMPLETE 4+ VIEW COMPARISON:  None. FINDINGS: No evidence of fracture, dislocation, or joint effusion. No evidence of arthropathy or other focal bone abnormality. Soft tissues are unremarkable. IMPRESSION: Negative. Electronically Signed   By: Rolm Baptise M.D.   On: 05/09/2020 15:57    Procedures Procedures (including critical care time)  Medications Ordered in ED Medications - No data to display  ED Course  I have reviewed the triage  vital signs and the nursing notes.  Pertinent labs & imaging results that were available during my care of the patient were reviewed by me and considered in my medical decision making (see chart for details).    MDM Rules/Calculators/A&P   Patient with no pertinent past medical history presents to the ED with a chief complaint of right knee pain for the past 2 weeks.  Patient reports she has a sharp sensation above the right knee, there has been no trauma, fevers, she has full range of motion and was ambulatory in the ED with a steady gait. Prior surgical fixation of her right knee.  Xray of her right knee showed: No fracture, no dislocation, no spurs or arthritis noted.   She's had no fevers at home and has full ROM of the right knee, lower suspicion for septic joint. There is mild swelling at quadricep tendon region, some suspicion for ligament involvement. Will provide her with orthopedic referral along with RICE therapy while at home. She was offered a knee sleeve for stability.    PDMP review patient's last prescription was sent on May 01, 2020: 05/01/2020  Oxycodone-Acetaminophen 5-325  120.00    Patient has medication we discussed follow up with Orthopedist, please is agreeable. Return precautions discussed at length.     Portions of this note were generated with [employer]. Dictation errors may occur despite best attempts at proofreading.  Final Clinical Impression(s) / ED Diagnoses Final diagnoses:  Acute pain of  right knee    Rx / DC Orders ED Discharge Orders    None       Janeece Fitting, PA-C 05/09/20 1621    Little, Wenda Overland, MD 05/13/20 0730

## 2020-06-08 ENCOUNTER — Other Ambulatory Visit: Payer: Self-pay

## 2020-06-08 ENCOUNTER — Encounter (HOSPITAL_COMMUNITY): Payer: Self-pay | Admitting: Emergency Medicine

## 2020-06-08 ENCOUNTER — Emergency Department (HOSPITAL_COMMUNITY): Payer: Medicare Other

## 2020-06-08 ENCOUNTER — Emergency Department (HOSPITAL_COMMUNITY)
Admission: EM | Admit: 2020-06-08 | Discharge: 2020-06-08 | Disposition: A | Discharge status: Home / Self Care | Payer: Medicare Other | Attending: Emergency Medicine | Admitting: Emergency Medicine

## 2020-06-08 DIAGNOSIS — Z5321 Procedure and treatment not carried out due to patient leaving prior to being seen by health care provider: Secondary | ICD-10-CM | POA: Insufficient documentation

## 2020-06-08 DIAGNOSIS — R0789 Other chest pain: Secondary | ICD-10-CM | POA: Insufficient documentation

## 2020-06-08 DIAGNOSIS — M79606 Pain in leg, unspecified: Secondary | ICD-10-CM | POA: Diagnosis not present

## 2020-06-08 LAB — BASIC METABOLIC PANEL
Anion gap: 14 (ref 5–15)
BUN: 27 mg/dL — ABNORMAL HIGH (ref 8–23)
CO2: 20 mmol/L — ABNORMAL LOW (ref 22–32)
Calcium: 9.7 mg/dL (ref 8.9–10.3)
Chloride: 106 mmol/L (ref 98–111)
Creatinine, Ser: 0.97 mg/dL (ref 0.44–1.00)
GFR calc Af Amer: >60 mL/min (ref >60)
GFR calc non Af Amer: 59 mL/min — ABNORMAL LOW (ref >60)
Glucose, Bld: 132 mg/dL — ABNORMAL HIGH (ref 70–99)
Potassium: 3.2 mmol/L — ABNORMAL LOW (ref 3.5–5.1)
Sodium: 140 mmol/L (ref 135–145)

## 2020-06-08 LAB — CBC
HCT: 37.2 % (ref 36.0–46.0)
Hemoglobin: 11.8 g/dL — ABNORMAL LOW (ref 12.0–15.0)
MCH: 25.1 pg — ABNORMAL LOW (ref 26.0–34.0)
MCHC: 31.7 g/dL (ref 30.0–36.0)
MCV: 79 fL — ABNORMAL LOW (ref 80.0–100.0)
Platelets: 250 10*3/uL (ref 150–400)
RBC: 4.71 MIL/uL (ref 3.87–5.11)
RDW: 17.2 % — ABNORMAL HIGH (ref 11.5–15.5)
WBC: 6.1 10*3/uL (ref 4.0–10.5)
nRBC: 0 % (ref 0.0–0.2)

## 2020-06-08 LAB — TROPONIN I (HIGH SENSITIVITY): Troponin I (High Sensitivity): 19 ng/L — ABNORMAL HIGH (ref <18)

## 2020-06-08 MED ORDER — SODIUM CHLORIDE 0.9% FLUSH: 3.0000 mL | Freq: Once | INTRAVENOUS | Status: DC

## 2020-06-08 NOTE — ED Triage Notes (Signed)
Pt. Stated, Ive had chest pain and leg pain for 2 days.

## 2020-06-08 NOTE — ED Notes (Signed)
No answer multiple times, moving patient OTF

## 2020-06-09 ENCOUNTER — Emergency Department (HOSPITAL_BASED_OUTPATIENT_CLINIC_OR_DEPARTMENT_OTHER): Payer: Medicare Other

## 2020-06-09 ENCOUNTER — Other Ambulatory Visit: Payer: Self-pay

## 2020-06-09 ENCOUNTER — Encounter (HOSPITAL_BASED_OUTPATIENT_CLINIC_OR_DEPARTMENT_OTHER): Payer: Self-pay | Admitting: Emergency Medicine

## 2020-06-09 ENCOUNTER — Emergency Department (HOSPITAL_BASED_OUTPATIENT_CLINIC_OR_DEPARTMENT_OTHER)
Admission: EM | Admit: 2020-06-09 | Discharge: 2020-06-09 | Disposition: A | Discharge status: Home / Self Care | Payer: Medicare Other | Attending: Emergency Medicine | Admitting: Emergency Medicine

## 2020-06-09 DIAGNOSIS — E7800 Pure hypercholesterolemia, unspecified: Secondary | ICD-10-CM | POA: Diagnosis not present

## 2020-06-09 DIAGNOSIS — G309 Alzheimer's disease, unspecified: Secondary | ICD-10-CM | POA: Diagnosis not present

## 2020-06-09 DIAGNOSIS — R197 Diarrhea, unspecified: Secondary | ICD-10-CM | POA: Insufficient documentation

## 2020-06-09 DIAGNOSIS — R630 Anorexia: Secondary | ICD-10-CM | POA: Insufficient documentation

## 2020-06-09 DIAGNOSIS — R05 Cough: Secondary | ICD-10-CM | POA: Diagnosis not present

## 2020-06-09 DIAGNOSIS — R6883 Chills (without fever): Secondary | ICD-10-CM | POA: Diagnosis not present

## 2020-06-09 DIAGNOSIS — R1011 Right upper quadrant pain: Secondary | ICD-10-CM | POA: Insufficient documentation

## 2020-06-09 DIAGNOSIS — K449 Diaphragmatic hernia without obstruction or gangrene: Secondary | ICD-10-CM | POA: Diagnosis not present

## 2020-06-09 DIAGNOSIS — R110 Nausea: Secondary | ICD-10-CM | POA: Diagnosis not present

## 2020-06-09 DIAGNOSIS — Z7982 Long term (current) use of aspirin: Secondary | ICD-10-CM | POA: Insufficient documentation

## 2020-06-09 DIAGNOSIS — F329 Major depressive disorder, single episode, unspecified: Secondary | ICD-10-CM | POA: Insufficient documentation

## 2020-06-09 LAB — CBC WITH DIFFERENTIAL/PLATELET
Abs Immature Granulocytes: 0.02 10*3/uL (ref 0.00–0.07)
Basophils Absolute: 0 10*3/uL (ref 0.0–0.1)
Basophils Relative: 0 %
Eosinophils Absolute: 0 10*3/uL (ref 0.0–0.5)
Eosinophils Relative: 1 %
HCT: 33.7 % — ABNORMAL LOW (ref 36.0–46.0)
Hemoglobin: 11 g/dL — ABNORMAL LOW (ref 12.0–15.0)
Immature Granulocytes: 0 %
Lymphocytes Relative: 22 %
Lymphs Abs: 1.4 10*3/uL (ref 0.7–4.0)
MCH: 25.6 pg — ABNORMAL LOW (ref 26.0–34.0)
MCHC: 32.6 g/dL (ref 30.0–36.0)
MCV: 78.6 fL — ABNORMAL LOW (ref 80.0–100.0)
Monocytes Absolute: 0.6 10*3/uL (ref 0.1–1.0)
Monocytes Relative: 11 %
Neutro Abs: 4 10*3/uL (ref 1.7–7.7)
Neutrophils Relative %: 66 %
Platelets: 234 10*3/uL (ref 150–400)
RBC: 4.29 MIL/uL (ref 3.87–5.11)
RDW: 17.2 % — ABNORMAL HIGH (ref 11.5–15.5)
WBC: 6.1 10*3/uL (ref 4.0–10.5)
nRBC: 0 % (ref 0.0–0.2)

## 2020-06-09 LAB — COMPREHENSIVE METABOLIC PANEL
ALT: 22 U/L (ref 0–44)
AST: 19 U/L (ref 15–41)
Albumin: 3.5 g/dL (ref 3.5–5.0)
Alkaline Phosphatase: 71 U/L (ref 38–126)
Anion gap: 13 (ref 5–15)
BUN: 23 mg/dL (ref 8–23)
CO2: 23 mmol/L (ref 22–32)
Calcium: 9.3 mg/dL (ref 8.9–10.3)
Chloride: 105 mmol/L (ref 98–111)
Creatinine, Ser: 0.78 mg/dL (ref 0.44–1.00)
GFR calc Af Amer: >60 mL/min (ref >60)
GFR calc non Af Amer: >60 mL/min (ref >60)
Glucose, Bld: 113 mg/dL — ABNORMAL HIGH (ref 70–99)
Potassium: 3.2 mmol/L — ABNORMAL LOW (ref 3.5–5.1)
Sodium: 141 mmol/L (ref 135–145)
Total Bilirubin: 0.6 mg/dL (ref 0.3–1.2)
Total Protein: 7.1 g/dL (ref 6.5–8.1)

## 2020-06-09 LAB — URINALYSIS, ROUTINE W REFLEX MICROSCOPIC
Bilirubin Urine: NEGATIVE
Glucose, UA: NEGATIVE mg/dL
Hgb urine dipstick: NEGATIVE
Ketones, ur: 15 mg/dL — AB
Nitrite: POSITIVE — AB
Protein, ur: NEGATIVE mg/dL
Specific Gravity, Urine: 1.025 (ref 1.005–1.030)
pH: 6 (ref 5.0–8.0)

## 2020-06-09 LAB — URINALYSIS, MICROSCOPIC (REFLEX)

## 2020-06-09 LAB — D-DIMER, QUANTITATIVE: D-Dimer, Quant: 1.11 ug/mL-FEU — ABNORMAL HIGH (ref 0.00–0.50)

## 2020-06-09 LAB — LIPASE, BLOOD: Lipase: 45 U/L (ref 11–51)

## 2020-06-09 MED ORDER — MORPHINE SULFATE (PF) 4 MG/ML IV SOLN
4.0000 mg | Freq: Once | INTRAVENOUS | Status: AC
  Administered 2020-06-09: 4 mg via INTRAVENOUS
  Filled 2020-06-09: qty 1

## 2020-06-09 MED ORDER — PANTOPRAZOLE SODIUM 20 MG PO TBEC
20.0000 mg | DELAYED_RELEASE_TABLET | Freq: Every day | ORAL | 0 refills | Status: DC
Start: 2020-06-09 — End: 2020-08-14

## 2020-06-09 MED ORDER — LACTATED RINGERS IV BOLUS
1000.0000 mL | Freq: Once | INTRAVENOUS | Status: AC
  Administered 2020-06-09: 1000 mL via INTRAVENOUS

## 2020-06-09 MED ORDER — ONDANSETRON HCL 4 MG/2ML IJ SOLN
4.0000 mg | Freq: Once | INTRAMUSCULAR | Status: AC
  Administered 2020-06-09: 4 mg via INTRAVENOUS
  Filled 2020-06-09: qty 2

## 2020-06-09 MED ORDER — IOHEXOL 350 MG/ML SOLN
100.0000 mL | Freq: Once | INTRAVENOUS | Status: AC | PRN
  Administered 2020-06-09: 75 mL via INTRAVENOUS

## 2020-06-09 MED ORDER — MORPHINE SULFATE (PF) 2 MG/ML IV SOLN
2.0000 mg | Freq: Once | INTRAVENOUS | Status: AC
  Administered 2020-06-09: 2 mg via INTRAVENOUS
  Filled 2020-06-09: qty 1

## 2020-06-09 NOTE — ED Provider Notes (Signed)
[place] EMERGENCY DEPARTMENT Provider Note   CSN: [id number] Arrival date & time: 06/09/20  1022     History Chief Complaint  Patient presents with  . Abdominal Pain    [name] is a 69 y.o. female.  The history is provided by the patient.  Abdominal Pain Pain location:  RUQ Pain quality: aching, cramping and gnawing   Pain radiates to:  Does not radiate Pain severity:  Moderate Onset quality:  Gradual Duration:  3 days Timing:  Constant Progression:  Worsening Chronicity:  New Context: not previous surgeries and not recent illness   Context comment:  Started spontaneously  Relieved by:  None tried Worsened by:  Coughing, deep breathing, movement and eating Ineffective treatments:  None tried Associated symptoms: anorexia, chills, cough, diarrhea and nausea   Associated symptoms: no chest pain, no dysuria, no fatigue, no fever, no melena, no shortness of breath and no vomiting   Risk factors: no alcohol abuse and has not had multiple surgeries   Risk factors comment:  No prior abd surgeries.  hx of schizophrenia and dementia      Past Medical History:  Diagnosis Date  . Alzheimer's dementia ([place])   . Depression   . Hypercholesteremia   . Schizophrenia ([place])     There are no problems to display for this patient.   Past Surgical History:  Procedure Laterality Date  . ABDOMINAL HYSTERECTOMY    . CARPAL TUNNEL RELEASE    . MANDIBLE SURGERY    . TUMOR REMOVAL       OB History   No obstetric history on file.     No family history on file.  Social History   Tobacco Use  . Smoking status: Never Smoker  . Smokeless tobacco: Never Used  Vaping Use  . Vaping Use: Never used  Substance Use Topics  . Alcohol use: No  . Drug use: No    Home Medications Prior to Admission medications   Medication Sig Start Date End Date Taking? Authorizing Provider  amoxicillin-clavulanate (AUGMENTIN) 875-125 MG per tablet Take 1 tablet by mouth  every 12 (twelve) hours. Patient not taking: Reported on 05/08/2020 11/18/12   Etta Quill, NP  aspirin EC 81 MG tablet Take 81 mg by mouth every morning.     [provider]  benztropine (COGENTIN) 1 MG tablet Take 1 mg by mouth 2 (two) times daily.     [provider]  BIOTIN PO Take 1 tablet by mouth every morning.     [provider]  buPROPion (WELLBUTRIN XL) 300 MG 24 hr tablet Take 300 mg by mouth every morning.     [provider]  citalopram (CELEXA) 40 MG tablet Take 40 mg by mouth every morning.     [provider]  clonazePAM (KLONOPIN) 1 MG tablet Take 1 mg by mouth 3 (three) times daily.    [provider]  erythromycin ophthalmic ointment Place a 1/2 inch ribbon of ointment into the lower eyelid 4 times per day for the next 3 days 05/24/14   Sherwood Gambler, MD  EVENING PRIMROSE OIL PO Take 1 capsule by mouth 2 (two) times daily. Reported on 01/07/2016    [provider]  fish oil-omega-3 fatty acids 1000 MG capsule Take 1 g by mouth 2 (two) times daily.     [provider]  HYDROcodone-acetaminophen (NORCO) 5-325 MG per tablet Take 1-2 tablets by mouth every 4 (four) hours as needed. Patient not taking: Reported  on 05/08/2020 05/24/14   Sherwood Gambler, MD  HYDROcodone-acetaminophen (NORCO/VICODIN) 5-325 MG per tablet Take 1 tablet by mouth every 6 (six) hours as needed for pain. Patient not taking: Reported on 05/08/2020 11/18/12   Etta Quill, NP  HYDROcodone-acetaminophen (NORCO/VICODIN) 5-325 MG per tablet Take 2 tablets by mouth every 4 (four) hours as needed for pain. 11/22/12   Glendell Docker, NP  nystatin-triamcinolone (MYCOLOG) ointment Apply 1 application topically 3 (three) times daily as needed. For rash on arm    [provider]  omeprazole (PRILOSEC) 40 MG capsule Take 40 mg by mouth every morning.     [provider]  ondansetron (ZOFRAN ODT) 4 MG disintegrating tablet 4mg  ODT q4  hours prn nausea/vomit 11/18/14   [name], MD  potassium chloride SA (K-DUR,KLOR-CON) 20 MEQ tablet Take 1 tablet (20 mEq total) by mouth daily. 11/18/14   [name], MD  QUEtiapine (SEROQUEL XR) 300 MG 24 hr tablet Take 600 mg by mouth at bedtime.     [provider]  simvastatin (ZOCOR) 80 MG tablet Take 80 mg by mouth at bedtime.     [provider]  zolpidem (AMBIEN) 5 MG tablet Take 5 mg by mouth at bedtime. For sleep    [provider]    Allergies    Patient has no known allergies.  Review of Systems   Review of Systems  Constitutional: Positive for chills. Negative for fatigue and fever.  Respiratory: Positive for cough. Negative for shortness of breath.   Cardiovascular: Negative for chest pain.  Gastrointestinal: Positive for abdominal pain, anorexia, diarrhea and nausea. Negative for melena and vomiting.  Genitourinary: Negative for dysuria.  All other systems reviewed and are negative.   Physical Exam Updated Vital Signs BP (!) 125/88   Pulse 84   Temp 98.4 F (36.9 C) (Oral)   Resp 20   Ht 5' (1.524 m)   Wt 91.6 kg   SpO2 100%   BMI 39.45 kg/m   Physical Exam Vitals and nursing note reviewed.  Constitutional:      General: She is not in acute distress.    Appearance: She is well-developed and normal weight.  HENT:     Head: Normocephalic and atraumatic.  Eyes:     Pupils: Pupils are equal, round, and reactive to light.  Cardiovascular:     Rate and Rhythm: Normal rate and regular rhythm.     Heart sounds: Normal heart sounds. No murmur heard.  No friction rub.  Pulmonary:     Effort: Pulmonary effort is normal.     Breath sounds: Normal breath sounds. No wheezing or rales.  Abdominal:     General: Bowel sounds are normal. There is no distension.     Palpations: Abdomen is soft.     Tenderness: There is abdominal tenderness in the right upper quadrant. There is guarding. There is no right CVA tenderness, left CVA  tenderness or rebound. Positive signs include Murphy's sign.  Musculoskeletal:        General: No tenderness. Normal range of motion.     Right lower leg: No edema.     Left lower leg: No edema.     Comments: No edema  Skin:    General: Skin is warm and dry.     Findings: No rash.  Neurological:     General: No focal deficit present.     Mental Status: She is alert and oriented to person, place, and time. Mental status is at baseline.  Cranial Nerves: No cranial nerve deficit.  Psychiatric:        Mood and Affect: Mood normal.        Behavior: Behavior normal.        Thought Content: Thought content normal.     ED Results / Procedures / Treatments   Labs (all labs ordered are listed, but only abnormal results are displayed) Labs Reviewed  CBC WITH DIFFERENTIAL/PLATELET - Abnormal; Notable for the following components:      Result Value   Hemoglobin 11.0 (*)    HCT 33.7 (*)    MCV 78.6 (*)    MCH 25.6 (*)    RDW 17.2 (*)    All other components within normal limits  COMPREHENSIVE METABOLIC PANEL - Abnormal; Notable for the following components:   Potassium 3.2 (*)    Glucose, Bld 113 (*)    All other components within normal limits  URINALYSIS, ROUTINE W REFLEX MICROSCOPIC - Abnormal; Notable for the following components:   APPearance CLOUDY (*)    Ketones, ur 15 (*)    Nitrite POSITIVE (*)    Leukocytes,Ua TRACE (*)    All other components within normal limits  URINALYSIS, MICROSCOPIC (REFLEX) - Abnormal; Notable for the following components:   Bacteria, UA MANY (*)    All other components within normal limits  LIPASE, BLOOD  D-DIMER, QUANTITATIVE (NOT AT [facility])    EKG EKG Interpretation  Date/Time:  Sunday June 09 2020 11:36:26 EDT Ventricular Rate:  63 PR Interval:    QRS Duration: 113 QT Interval:  485 QTC Calculation: 497 R Axis:   38 Text Interpretation: Sinus rhythm Borderline intraventricular conduction delay Abnormal R-wave progression, early  transition Borderline prolonged QT interval No significant change since last tracing Confirmed by Blanchie Dessert [id number]) on 06/09/2020 12:11:55 PM   Radiology DG Chest 2 View  Result Date: 06/08/2020 CLINICAL DATA:  Two day history of RIGHT-sided chest pain radiating into the RIGHT arm, associated with shortness of breath. EXAM: CHEST - 2 VIEW COMPARISON:  09/03/2011. FINDINGS: Cardiac silhouette normal in size. Moderate-sized hiatal hernia, significantly increased in size since 2012. Hilar and mediastinal contours otherwise unremarkable. Densely calcified granuloma deep in the LEFT LOWER LOBE posteriorly, unchanged. Mildly prominent bronchovascular markings and mild central peribronchial thickening, more so than on the prior examination. Lungs otherwise clear. No localized airspace consolidation. No pleural effusions. No pneumothorax. Normal pulmonary vascularity. BILATERAL breast implants with dystrophic calcifications involving the capsules. Degenerative changes at the thoracolumbar junction and the upper lumbar spine. IMPRESSION: 1. Mild changes of acute bronchitis and/or asthma without focal airspace pneumonia. 2. Moderate-sized hiatal hernia, significantly increased in size since 2012. Electronically Signed   By: Evangeline Dakin M.D.   On: 06/08/2020 12:55   DG Chest Port 1 View  Result Date: 06/09/2020 CLINICAL DATA:  RIGHT upper quadrant pain back pain EXAM: PORTABLE CHEST 1 VIEW COMPARISON:  June 08, 2020 FINDINGS: Cardiomediastinal contours and hilar structures are stable. Stable cardiac enlargement. Lungs are clear. No sign of effusion. The density in the retrocardiac region compatible with large hiatal hernia similar to the prior study, new compared to more remote priors. Visualized skeletal structures on limited assessment are unremarkable. IMPRESSION: Large hiatal hernia, similar to the recent comparison, new when compared to more remote priors. Electronically Signed   By: Zetta Bills  M.D.   On: 06/09/2020 12:35   US Abdomen Limited RUQ  Result Date: 06/09/2020 CLINICAL DATA:  RIGHT upper quadrant pain and diarrhea for 3 days. EXAM:  ULTRASOUND ABDOMEN LIMITED RIGHT UPPER QUADRANT COMPARISON:  None. FINDINGS: Gallbladder: No gallstones or wall thickening visualized. No sonographic Murphy sign noted by sonographer. Incidental note made of 2 small gallbladder wall polyps. Common bile duct: Diameter: 2 mm Liver: No focal lesion identified. Overall within normal limits in parenchymal echogenicity. There are, however, scattered small echogenic foci demonstrated which can be an indication of underlying hepatitis. Portal vein is patent on color Doppler imaging with normal direction of blood flow towards the liver. Other: None. IMPRESSION: 1. No evidence of cholecystitis.  No gallstones. 2. No bile duct dilatation. 3. Scattered small echogenic foci within the liver. This is a nonspecific finding but can be seen in the setting of acute hepatitis. Recommend correlation with clinical symptoms and liver function tests. Liver appears otherwise normal. Electronically Signed   By: Franki Cabot M.D.   On: 06/09/2020 14:06    Procedures Procedures (including critical care time)  Medications Ordered in ED Medications  morphine 4 MG/ML injection 4 mg (has no administration in time range)  lactated ringers bolus 1,000 mL (1,000 mLs Intravenous New Bag/Given 06/09/20 1157)  ondansetron (ZOFRAN) injection 4 mg (4 mg Intravenous Given 06/09/20 1212)    ED Course  I have reviewed the triage vital signs and the nursing notes.  Pertinent labs & imaging results that were available during my care of the patient were reviewed by me and considered in my medical decision making (see chart for details).    MDM Rules/Calculators/A&P                          Elderly female presenting today with right upper quadrant pain.  The pain started 3 days ago and she is having anorexia and diarrhea.  It is worse with  coughing and taking a deep breath.  She does have a positive Murphy sign but also chest wall tenderness.  No evidence of zoster at this time.  Patient's vital signs are reassuring however concern for pneumonia versus gallbladder pathology or hepatitis.  Patient is also having diarrhea with the symptoms.  She is been vaccinated against Covid and low suspicion for Covid at this time.  Also she is low risk [place].  EKG without acute changes.  Chest x-ray showing mild changes of acute bronchitis but no focal pneumonia.  Also a hiatal hernia that is present that has enlarged since last imaging.  CBC with normal white count and stable hemoglobin.  CMP and lipase without acute findings.  Patient given pain control and IV fluids due to poor oral intake and diarrhea.  2:22 PM RUQ U/S shows no evidence of cholecystitis, no bile duct dilation.  Scattered nonspecific findings which  Could be acute hepatitis but LFT's are wnl and low suspicion that is the cause.  Will get a d-dimer to ensure we do not need to do further eval for PE and if neg will get CT.  3:25 PM D-dimer is positive and CTA ordered.  On repeat eval pt still having pain in the RUQ only.  UA with bacteria and some WBC's but low suspicion for UTI.  Culture sent.  MDM Number of Diagnoses or Management Options   Amount and/or Complexity of Data Reviewed Clinical lab tests: ordered and reviewed Tests in the radiology section of CPT: ordered and reviewed Tests in the medicine section of CPT: ordered and reviewed Decide to obtain previous medical records or to obtain history from someone other than the patient: yes Review and  summarize past medical records: yes Discuss the patient with other providers: no Independent visualization of images, tracings, or specimens: yes  Risk of Complications, Morbidity, and/or Mortality Presenting problems: moderate Diagnostic procedures: moderate Management options: low  Patient Progress Patient progress:  improved    Final Clinical Impression(s) / ED Diagnoses Final diagnoses:  None    Rx / DC Orders ED Discharge Orders    None       Blanchie Dessert, MD 06/09/20 1526

## 2020-06-09 NOTE — ED Provider Notes (Signed)
Pt signed out by Dr. Maryan Rued pending CTA.  IMPRESSION: 1. No evidence of pulmonary embolism. 2. Dependent atelectatic changes with additional bandlike areas of further subsegmental atelectasis or cicatricial change. Findings likely exacerbated by imaging during exhalation. 3. Hiatal hernia with partial mesenteroaxial rotation. 4. No other acute intrathoracic process. 5. Sequela of prior granulomatous disease in the chest and abdomen.  Pt is feeling much better.  Pain could be from hiatal hernia.  She is d/c with protonix.  Return if worse.  F/u with pcp.   Isla Pence, MD 06/09/20 [phone]

## 2020-06-09 NOTE — ED Notes (Signed)
ED Provider at bedside. 

## 2020-06-09 NOTE — ED Notes (Signed)
Portable Xray at bedside.

## 2020-06-09 NOTE — ED Notes (Signed)
Pharmacy and medications updated with patient 

## 2020-06-09 NOTE — ED Triage Notes (Signed)
Pt c/o right upper abdominal pain without N/V but having diarrhea x 3 days.

## 2020-06-09 NOTE — ED Notes (Signed)
Pt ambulatory with steady gait to restroom for urine specimen 

## 2020-06-09 NOTE — ED Notes (Signed)
Patient transported to CT 

## 2020-06-10 DIAGNOSIS — R9431 Abnormal electrocardiogram [ECG] [EKG]: Secondary | ICD-10-CM

## 2020-06-10 HISTORY — DX: Abnormal electrocardiogram (ECG) (EKG): R94.31

## 2020-06-11 LAB — URINE CULTURE: Culture: >=100000 — AB

## 2020-06-12 ENCOUNTER — Telehealth: Payer: Self-pay

## 2020-06-12 NOTE — Telephone Encounter (Signed)
No treatment for UC ED 06/09/20 per Dewaine Conger MD

## 2020-08-14 ENCOUNTER — Other Ambulatory Visit: Payer: Self-pay

## 2020-08-14 ENCOUNTER — Encounter (HOSPITAL_BASED_OUTPATIENT_CLINIC_OR_DEPARTMENT_OTHER): Payer: Self-pay | Admitting: Oral Surgery

## 2020-08-16 ENCOUNTER — Other Ambulatory Visit (HOSPITAL_COMMUNITY): Payer: Medicare Other | Attending: Oral Surgery

## 2020-08-17 ENCOUNTER — Other Ambulatory Visit (HOSPITAL_COMMUNITY)
Admission: RE | Admit: 2020-08-17 | Discharge: 2020-08-17 | Disposition: A | Discharge status: Home / Self Care | Payer: Medicare Other | Source: Ambulatory Visit | Attending: Oral Surgery | Admitting: Oral Surgery

## 2020-08-17 DIAGNOSIS — Z20822 Contact with and (suspected) exposure to covid-19: Secondary | ICD-10-CM | POA: Insufficient documentation

## 2020-08-17 DIAGNOSIS — Z01812 Encounter for preprocedural laboratory examination: Secondary | ICD-10-CM | POA: Insufficient documentation

## 2020-08-17 LAB — SARS CORONAVIRUS 2 (TAT 6-24 HRS): SARS Coronavirus 2: NEGATIVE

## 2020-08-19 NOTE — H&P (Signed)
HISTORY AND PHYSICAL  [name] is a 69 y.o. female patient with CC: Unable to wear dentures. Referred by dentist for bone trimming.  No diagnosis found.  Past Medical History:  Diagnosis Date  . Alzheimer's dementia ([place])   . Depression   . Hypercholesteremia   . Schizophrenia ([place])     No current facility-administered medications for this encounter.   Current Outpatient Medications  Medication Sig Dispense Refill  . aspirin EC 81 MG tablet Take 81 mg by mouth every morning.     . benztropine (COGENTIN) 1 MG tablet Take 1 mg by mouth 2 (two) times daily.     Marland Kitchen BIOTIN PO Take 1 tablet by mouth every morning.     Marland Kitchen buPROPion (WELLBUTRIN XL) 300 MG 24 hr tablet Take 300 mg by mouth every morning.     . citalopram (CELEXA) 40 MG tablet Take 40 mg by mouth every morning.     . fish oil-omega-3 fatty acids 1000 MG capsule Take 1 g by mouth 2 (two) times daily.     Marland Kitchen omeprazole (PRILOSEC) 40 MG capsule Take 40 mg by mouth every morning.     . potassium chloride SA (K-DUR,KLOR-CON) 20 MEQ tablet Take 1 tablet (20 mEq total) by mouth daily. 4 tablet 0  . QUEtiapine (SEROQUEL XR) 300 MG 24 hr tablet Take 600 mg by mouth at bedtime.     . simvastatin (ZOCOR) 80 MG tablet Take 80 mg by mouth at bedtime.     Marland Kitchen zolpidem (AMBIEN) 5 MG tablet Take 5 mg by mouth at bedtime. For sleep    . erythromycin ophthalmic ointment Place a 1/2 inch ribbon of ointment into the lower eyelid 4 times per day for the next 3 days 1 g 0  . HYDROcodone-acetaminophen (NORCO) 5-325 MG per tablet Take 1-2 tablets by mouth every 4 (four) hours as needed. (Patient not taking: Reported on 05/08/2020) 10 tablet 0  . HYDROcodone-acetaminophen (NORCO/VICODIN) 5-325 MG per tablet Take 1 tablet by mouth every 6 (six) hours as needed for pain. (Patient not taking: Reported on 05/08/2020) 10 tablet 0  . HYDROcodone-acetaminophen (NORCO/VICODIN) 5-325 MG per tablet Take 2 tablets by mouth every 4 (four) hours as needed for  pain. 6 tablet 0  . nystatin-triamcinolone (MYCOLOG) ointment Apply 1 application topically 3 (three) times daily as needed. For rash on arm    . ondansetron (ZOFRAN ODT) 4 MG disintegrating tablet 4mg  ODT q4 hours prn nausea/vomit 6 tablet 0   No Known Allergies Active Problems:   * No active hospital problems. *  Vitals: Height 5\' 5"  (1.651 m), weight 90.3 kg. Lab results:No results found for this or any previous visit (from the past 35 hour(s)). Radiology Results: No results found. General appearance: alert, cooperative, no distress and moderately obese Head: Normocephalic, without obvious abnormality, atraumatic Eyes: negative Nose: Nares normal. Septum midline. Mucosa normal. No drainage or sinus tenderness. Throat: Edentulous maxilla and mandible. Linguial tori bilateral mandible. Alveolar ridge undeercuts in pre-molar andf canine areas. No purulence, edema, fluctuance. Pharynx clear. Neck: no adenopathy and supple, symmetrical, trachea midline Resp: clear to auscultation bilaterally Cardio: regular rate and rhythm, S1, S2 normal, no murmur, click, rub or gallop  Assessment: Mandibular tori, irregular alveolar ridge contour, mandible.   Plan:Alveoloplasty right and left mandible, removal lingual tori. GA, Day surgery.   Diona Browner 08/19/2020

## 2020-08-20 ENCOUNTER — Ambulatory Visit (HOSPITAL_BASED_OUTPATIENT_CLINIC_OR_DEPARTMENT_OTHER)
Admission: RE | Admit: 2020-08-20 | Discharge: 2020-08-20 | Disposition: A | Discharge status: Home / Self Care | Payer: Medicare Other | Attending: Oral Surgery | Admitting: Oral Surgery

## 2020-08-20 ENCOUNTER — Other Ambulatory Visit: Payer: Self-pay

## 2020-08-20 ENCOUNTER — Ambulatory Visit (HOSPITAL_BASED_OUTPATIENT_CLINIC_OR_DEPARTMENT_OTHER): Payer: Medicare Other | Admitting: Anesthesiology

## 2020-08-20 ENCOUNTER — Encounter (HOSPITAL_BASED_OUTPATIENT_CLINIC_OR_DEPARTMENT_OTHER)
Admission: RE | Disposition: A | Discharge status: Home / Self Care | Payer: Self-pay | Source: Home / Self Care | Attending: Oral Surgery

## 2020-08-20 ENCOUNTER — Encounter (HOSPITAL_BASED_OUTPATIENT_CLINIC_OR_DEPARTMENT_OTHER): Payer: Self-pay | Admitting: Oral Surgery

## 2020-08-20 DIAGNOSIS — Z79899 Other long term (current) drug therapy: Secondary | ICD-10-CM | POA: Insufficient documentation

## 2020-08-20 DIAGNOSIS — E7800 Pure hypercholesterolemia, unspecified: Secondary | ICD-10-CM | POA: Insufficient documentation

## 2020-08-20 DIAGNOSIS — Z20822 Contact with and (suspected) exposure to covid-19: Secondary | ICD-10-CM | POA: Diagnosis not present

## 2020-08-20 DIAGNOSIS — Z7982 Long term (current) use of aspirin: Secondary | ICD-10-CM | POA: Diagnosis not present

## 2020-08-20 DIAGNOSIS — G309 Alzheimer's disease, unspecified: Secondary | ICD-10-CM | POA: Diagnosis not present

## 2020-08-20 DIAGNOSIS — M2670 Unspecified alveolar anomaly: Secondary | ICD-10-CM | POA: Insufficient documentation

## 2020-08-20 DIAGNOSIS — K089 Disorder of teeth and supporting structures, unspecified: Secondary | ICD-10-CM | POA: Insufficient documentation

## 2020-08-20 DIAGNOSIS — F0280 Dementia in other diseases classified elsewhere without behavioral disturbance: Secondary | ICD-10-CM | POA: Diagnosis not present

## 2020-08-20 DIAGNOSIS — F209 Schizophrenia, unspecified: Secondary | ICD-10-CM | POA: Insufficient documentation

## 2020-08-20 HISTORY — PX: ALVEOLOPLASTY: SHX5710

## 2020-08-20 SURGERY — ALVEOLOPLASTY
Anesthesia: General | Site: Mouth | Laterality: Bilateral

## 2020-08-20 MED ORDER — FENTANYL CITRATE (PF) 100 MCG/2ML IJ SOLN
INTRAMUSCULAR | Status: AC
  Filled 2020-08-20: qty 2

## 2020-08-20 MED ORDER — EPHEDRINE SULFATE 50 MG/ML IJ SOLN
INTRAMUSCULAR | Status: DC | PRN
  Administered 2020-08-20 (×2): 10 mg via INTRAVENOUS

## 2020-08-20 MED ORDER — ROCURONIUM BROMIDE 100 MG/10ML IV SOLN
INTRAVENOUS | Status: DC | PRN
  Administered 2020-08-20: 30 mg via INTRAVENOUS

## 2020-08-20 MED ORDER — OXYMETAZOLINE HCL 0.05 % NA SOLN
NASAL | Status: DC | PRN
  Administered 2020-08-20: 2 via NASAL

## 2020-08-20 MED ORDER — PROPOFOL 10 MG/ML IV BOLUS
INTRAVENOUS | Status: DC | PRN
  Administered 2020-08-20: 50 mg via INTRAVENOUS
  Administered 2020-08-20: 20 mg via INTRAVENOUS
  Administered 2020-08-20: 150 mg via INTRAVENOUS

## 2020-08-20 MED ORDER — CEFAZOLIN SODIUM-DEXTROSE 2-4 GM/100ML-% IV SOLN
2.0000 g | INTRAVENOUS | Status: AC
  Administered 2020-08-20: 2 g via INTRAVENOUS

## 2020-08-20 MED ORDER — LACTATED RINGERS IV SOLN: INTRAVENOUS | Status: DC

## 2020-08-20 MED ORDER — ROCURONIUM BROMIDE 10 MG/ML (PF) SYRINGE
PREFILLED_SYRINGE | INTRAVENOUS | Status: AC
  Filled 2020-08-20: qty 10

## 2020-08-20 MED ORDER — DEXAMETHASONE SODIUM PHOSPHATE 4 MG/ML IJ SOLN
INTRAMUSCULAR | Status: DC | PRN
  Administered 2020-08-20: 5 mg via INTRAVENOUS

## 2020-08-20 MED ORDER — ACETAMINOPHEN 325 MG PO TABS
ORAL_TABLET | ORAL | Status: AC
  Filled 2020-08-20: qty 2

## 2020-08-20 MED ORDER — DEXMEDETOMIDINE (PRECEDEX) IN NS 20 MCG/5ML (4 MCG/ML) IV SYRINGE
PREFILLED_SYRINGE | INTRAVENOUS | Status: DC | PRN
  Administered 2020-08-20: 8 ug via INTRAVENOUS

## 2020-08-20 MED ORDER — CEFAZOLIN SODIUM-DEXTROSE 2-4 GM/100ML-% IV SOLN
INTRAVENOUS | Status: AC
  Filled 2020-08-20: qty 100

## 2020-08-20 MED ORDER — OXYCODONE HCL 5 MG/5ML PO SOLN: 5.0000 mg | Freq: Once | ORAL | Status: DC | PRN

## 2020-08-20 MED ORDER — SUCCINYLCHOLINE CHLORIDE 20 MG/ML IJ SOLN
INTRAMUSCULAR | Status: DC | PRN
  Administered 2020-08-20: 100 mg via INTRAVENOUS

## 2020-08-20 MED ORDER — SODIUM CHLORIDE 0.9 % IV SOLN
INTRAVENOUS | Status: AC | PRN
  Administered 2020-08-20: 500 mL

## 2020-08-20 MED ORDER — FENTANYL CITRATE (PF) 100 MCG/2ML IJ SOLN
INTRAMUSCULAR | Status: DC | PRN
  Administered 2020-08-20: 50 ug via INTRAVENOUS
  Administered 2020-08-20: 60 ug via INTRAVENOUS
  Administered 2020-08-20: 50 ug via INTRAVENOUS

## 2020-08-20 MED ORDER — FENTANYL CITRATE (PF) 100 MCG/2ML IJ SOLN: 25.0000 ug | INTRAMUSCULAR | Status: DC | PRN

## 2020-08-20 MED ORDER — ONDANSETRON HCL 4 MG/2ML IJ SOLN: 4.0000 mg | Freq: Once | INTRAMUSCULAR | Status: DC | PRN

## 2020-08-20 MED ORDER — LIDOCAINE 2% (20 MG/ML) 5 ML SYRINGE
INTRAMUSCULAR | Status: AC
  Filled 2020-08-20: qty 5

## 2020-08-20 MED ORDER — ONDANSETRON HCL 4 MG/2ML IJ SOLN
INTRAMUSCULAR | Status: DC | PRN
  Administered 2020-08-20: 4 mg via INTRAVENOUS

## 2020-08-20 MED ORDER — PROPOFOL 500 MG/50ML IV EMUL
INTRAVENOUS | Status: AC
  Filled 2020-08-20: qty 50

## 2020-08-20 MED ORDER — SUGAMMADEX SODIUM 200 MG/2ML IV SOLN
INTRAVENOUS | Status: DC | PRN
  Administered 2020-08-20: 200 mg via INTRAVENOUS

## 2020-08-20 MED ORDER — LIDOCAINE-EPINEPHRINE 2 %-1:100000 IJ SOLN
INTRAMUSCULAR | Status: DC | PRN
  Administered 2020-08-20: 10 mL

## 2020-08-20 MED ORDER — ONDANSETRON HCL 4 MG/2ML IJ SOLN
INTRAMUSCULAR | Status: AC
  Filled 2020-08-20: qty 2

## 2020-08-20 MED ORDER — IBUPROFEN 600 MG PO TABS: 600.0000 mg | ORAL_TABLET | Freq: Four times a day (QID) | ORAL | 1 refills | Status: DC | PRN

## 2020-08-20 MED ORDER — ACETAMINOPHEN 325 MG PO TABS
650.0000 mg | ORAL_TABLET | Freq: Once | ORAL | Status: AC
  Administered 2020-08-20: 650 mg via ORAL

## 2020-08-20 MED ORDER — DEXAMETHASONE SODIUM PHOSPHATE 10 MG/ML IJ SOLN
INTRAMUSCULAR | Status: AC
  Filled 2020-08-20: qty 1

## 2020-08-20 MED ORDER — OXYCODONE HCL 5 MG PO TABS: 5.0000 mg | ORAL_TABLET | Freq: Once | ORAL | Status: DC | PRN

## 2020-08-20 SURGICAL SUPPLY — 45 items
BLADE SURG 15 STRL LF DISP TIS (BLADE) ×2 IMPLANT
BLADE SURG 15 STRL SS (BLADE) ×4
BNDG COHESIVE 2X5 TAN STRL LF (GAUZE/BANDAGES/DRESSINGS) IMPLANT
BNDG EYE OVAL (GAUZE/BANDAGES/DRESSINGS) IMPLANT
BUR CROSS CUT FISSURE 1.6 (BURR) ×1 IMPLANT
BUR CROSS CUT FISSURE 1.6MM (BURR)
BUR EGG ELITE 4.0 (BURR) IMPLANT
BUR EGG ELITE 4.0MM (BURR)
CANISTER SUCT 1200ML W/VALVE (MISCELLANEOUS) ×4 IMPLANT
CATH ROBINSON RED A/P 10FR (CATHETERS) IMPLANT
COVER BACK TABLE 60X90IN (DRAPES) ×4 IMPLANT
COVER MAYO STAND STRL (DRAPES) ×4 IMPLANT
COVER WAND RF STERILE (DRAPES) IMPLANT
DECANTER SPIKE VIAL GLASS SM (MISCELLANEOUS) IMPLANT
DRAPE U-SHAPE 76X120 STRL (DRAPES) ×4 IMPLANT
GAUZE PACKING FOLDED 2  STR (GAUZE/BANDAGES/DRESSINGS) ×2
GAUZE PACKING FOLDED 2 STR (GAUZE/BANDAGES/DRESSINGS) ×2 IMPLANT
GAUZE PACKING IODOFORM 1/4X15 (PACKING) IMPLANT
GLOVE BIO SURGEON STRL SZ 6.5 (GLOVE) ×3 IMPLANT
GLOVE BIO SURGEON STRL SZ7.5 (GLOVE) ×4 IMPLANT
GLOVE BIO SURGEONS STRL SZ 6.5 (GLOVE) ×1
GLOVE BIOGEL PI IND STRL 6.5 (GLOVE) ×2 IMPLANT
GLOVE BIOGEL PI INDICATOR 6.5 (GLOVE) ×2
GOWN STRL REUS W/ TWL LRG LVL3 (GOWN DISPOSABLE) ×2 IMPLANT
GOWN STRL REUS W/ TWL XL LVL3 (GOWN DISPOSABLE) ×2 IMPLANT
GOWN STRL REUS W/TWL LRG LVL3 (GOWN DISPOSABLE) ×4
GOWN STRL REUS W/TWL XL LVL3 (GOWN DISPOSABLE) ×4
IV NS 500ML (IV SOLUTION) ×4
IV NS 500ML BAXH (IV SOLUTION) ×2 IMPLANT
NEEDLE HYPO 22GX1.5 SAFETY (NEEDLE) ×4 IMPLANT
NS IRRIG 1000ML POUR BTL (IV SOLUTION) ×4 IMPLANT
PACK BASIN DAY SURGERY FS (CUSTOM PROCEDURE TRAY) ×4 IMPLANT
SLEEVE IRRIGATION ELITE 7 (MISCELLANEOUS) ×4 IMPLANT
SLEEVE SCD COMPRESS KNEE MED (MISCELLANEOUS) IMPLANT
SPONGE SURGIFOAM ABS GEL 12-7 (HEMOSTASIS) IMPLANT
SUT CHROMIC 3 0 PS 2 (SUTURE) ×4 IMPLANT
SYR BULB EAR ULCER 3OZ GRN STR (SYRINGE) ×4 IMPLANT
SYR CONTROL 10ML LL (SYRINGE) ×4 IMPLANT
TOOTHBRUSH ADULT (PERSONAL CARE ITEMS) IMPLANT
TOWEL GREEN STERILE FF (TOWEL DISPOSABLE) ×4 IMPLANT
TRAY DSU PREP LF (CUSTOM PROCEDURE TRAY) IMPLANT
TUBE CONNECTING 20'X1/4 (TUBING) ×1
TUBE CONNECTING 20X1/4 (TUBING) ×3 IMPLANT
TUBING IRRIGATION (MISCELLANEOUS) ×4 IMPLANT
YANKAUER SUCT BULB TIP NO VENT (SUCTIONS) ×4 IMPLANT

## 2020-08-20 NOTE — Op Note (Signed)
NAME: [name], [name] MEDICAL RECORD HR:[id number] ACCOUNT [id number] DATE OF BIRTH:1951-08-30 FACILITY: [facility] LOCATION: [facility] PHYSICIAN:Alwyn Cordner M. Mellonie Guess, DDS  OPERATIVE REPORT  DATE OF PROCEDURE:  08/20/2020  PREOPERATIVE DIAGNOSES:  Bilateral mandibular lingual tori, mandibular alveolar contour irregularity.    POSTOPERATIVE DIAGNOSES:  Bilateral mandibular lingual tori, mandibular alveolar contour irregularity.    PROCEDURE:  Alveoplasty right and left mandible, removal bilateral mandibular lingual tori.  SURGEON:  Diona Browner, DDS  ANESTHESIA:  General, nasal intubation.  Kerin Perna, attending.  DESCRIPTION OF PROCEDURE:  The patient was taken to the operating room and placed on the table in supine position.  General anesthesia was administered.  A nasal endotracheal tube was placed and secured.  The eyes were protected and the patient was  draped for surgery.  A timeout was performed.  The posterior pharynx was suctioned and a throat pack was placed.  2% lidocaine 1:100,000 epinephrine was infiltrated and an inferior alveolar block on the right and left sides and in buccal infiltration  around the maxilla; total of 10 mL was utilized.  A bite block was placed on the right side of the mouth and a sweetheart retractor was used to retract the tongue.  Then, a 15 blade was used to make an incision beginning in the area of the premolar along  the edentulous ridge of the left mandible and carried forward along the alveolar crest in the midline where a vertical releasing incision was made buccally.  The tissue was reflected both buccally and lingually to expose the irregular alveolus and the  lingual tori.  Then, the Stryker handpiece with an egg bur under irrigation was used to smooth these areas and remove the torus.  Then, bone file was used and the area was closed with 3-0 chromic.  The bite block and sweetheart retractor were  repositioned to the other side of the mouth.  Another  incision was made along the alveolar crest on the right side beginning in the area of the 2nd molar and carried forward to the midline.  The periosteum was reflected.  Then, the bone was trimmed and  alveoplasty was performed using the egg bur under irrigation.  Then, the torus was removed.  Then, the area was further smoothed with a bone file and then irrigated and closed with 3-0 chromic.  Then, the oral cavity was irrigated and suctioned.  The  throat pack was removed.  The patient was left in care of anesthesia for extubation and transport to recovery room with plans for discharge home.  ESTIMATED BLOOD LOSS:  Minimal.  COMPLICATIONS:  None.  SPECIMENS:  None.  CN/NUANCE  D:08/20/2020 T:08/20/2020 JOB:[id number]

## 2020-08-20 NOTE — H&P (Signed)
H&P documentation  -History and Physical Reviewed  -Patient has been re-examined  -No change in the plan of care  [name]  

## 2020-08-20 NOTE — Op Note (Signed)
08/20/2020  8:20 AM  PATIENT:  [name]  69 y.o. female  PRE-OPERATIVE DIAGNOSIS:  Bilateral mandibular lingual tori, mandibular alveolar contour irregularity  POST-OPERATIVE DIAGNOSIS:  SAME  PROCEDURE:  Procedure(s): ALVEOLOPLASTY Right and left mandible, removal bilateral mandibular lingual tori.  SURGEON:  Surgeon(s): Diona Browner, DDS  ANESTHESIA:   local and general  EBL:  minimal  DRAINS: none   SPECIMEN:  No Specimen  COUNTS:  YES  PLAN OF CARE: Discharge to home after PACU  PATIENT DISPOSITION:  PACU - hemodynamically stable.   PROCEDURE DETAILS: Dictation #[id number]  Gae Bon, DMD 08/20/2020 8:20 AM

## 2020-08-20 NOTE — Anesthesia Postprocedure Evaluation (Signed)
Anesthesia Post Note  Patient: [name]  Procedure(s) Performed: ALVEOLOPLASTY (Bilateral Mouth)     Patient location during evaluation: PACU Anesthesia Type: General Level of consciousness: awake and alert Pain management: pain level controlled Vital Signs Assessment: post-procedure vital signs reviewed and stable Respiratory status: spontaneous breathing, nonlabored ventilation and respiratory function stable Cardiovascular status: blood pressure returned to baseline and stable Postop Assessment: no apparent nausea or vomiting Anesthetic complications: no   No complications documented.  Last Vitals:  Vitals:   08/20/20 0913 08/20/20 0920  BP: 139/79 139/79  Pulse: 76   Resp:  18  Temp:  36.8 C  SpO2: 98% 97%    Last Pain:  Vitals:   08/20/20 0923  TempSrc:   PainSc: 5                  Lidia Collum

## 2020-08-20 NOTE — Anesthesia Procedure Notes (Signed)
Procedure Name: Intubation Date/Time: 08/20/2020 7:40 AM Performed by: Maryella Shivers, CRNA Pre-anesthesia Checklist: Patient identified, Emergency Drugs available, Suction available and Patient being monitored Patient Re-evaluated:Patient Re-evaluated prior to induction Oxygen Delivery Method: Circle system utilized Preoxygenation: Pre-oxygenation with 100% oxygen Induction Type: IV induction Ventilation: Mask ventilation without difficulty Laryngoscope Size: Mac and 3 Grade View: Grade I Nasal Tubes: Nasal prep performed, Nasal Rae and Magill forceps- large, utilized Tube size: 6.5 mm Placement Confirmation: ETT inserted through vocal cords under direct vision,  positive ETCO2 and breath sounds checked- equal and bilateral Secured at: 27 cm Tube secured with: Tape Dental Injury: Teeth and Oropharynx as per pre-operative assessment

## 2020-08-20 NOTE — Anesthesia Preprocedure Evaluation (Addendum)
Anesthesia Evaluation  Patient identified by MRN, date of birth, ID band Patient awake    Reviewed: Allergy & Precautions, NPO status , Patient's Chart, lab work & pertinent test results  History of Anesthesia Complications Negative for: history of anesthetic complications  Airway Mallampati: II  TM Distance: >3 FB Neck ROM: Full    Dental  (+) Edentulous Upper, Edentulous Lower   Pulmonary neg pulmonary ROS,    Pulmonary exam normal        Cardiovascular negative cardio ROS Normal cardiovascular exam     Neuro/Psych Depression Schizophrenia Dementia negative neurological ROS     GI/Hepatic negative GI ROS, Neg liver ROS,   Endo/Other  negative endocrine ROS  Renal/GU negative Renal ROS  negative genitourinary   Musculoskeletal negative musculoskeletal ROS (+)   Abdominal   Peds  Hematology negative hematology ROS (+)   Anesthesia Other Findings  HLD  Reproductive/Obstetrics                            Anesthesia Physical Anesthesia Plan  ASA: II  Anesthesia Plan: General   Post-op Pain Management:    Induction: Intravenous  PONV Risk Score and Plan: 3 and Ondansetron, Dexamethasone, Treatment may vary due to age or medical condition and Midazolam  Airway Management Planned: Nasal ETT  Additional Equipment: None  Intra-op Plan:   Post-operative Plan: Extubation in OR  Informed Consent: I have reviewed the patients History and Physical, chart, labs and discussed the procedure including the risks, benefits and alternatives for the proposed anesthesia with the patient or authorized representative who has indicated his/her understanding and acceptance.     Dental advisory given  Plan Discussed with:   Anesthesia Plan Comments:        Anesthesia Quick Evaluation

## 2020-08-20 NOTE — Discharge Instructions (Signed)
NO TYLENOL PRODUCTS UNTIL 3:30 PM   Post Anesthesia Home Care Instructions  Activity: Get plenty of rest for the remainder of the day. A responsible individual must stay with you for 24 hours following the procedure.  For the next 24 hours, DO NOT: -Drive a car -Paediatric nurse -Drink alcoholic beverages -Take any medication unless instructed by your physician -Make any legal decisions or sign important papers.  Meals: Start with liquid foods such as gelatin or soup. Progress to regular foods as tolerated. Avoid greasy, spicy, heavy foods. If nausea and/or vomiting occur, drink only clear liquids until the nausea and/or vomiting subsides. Call your physician if vomiting continues.  Special Instructions/Symptoms: Your throat may feel dry or sore from the anesthesia or the breathing tube placed in your throat during surgery. If this causes discomfort, gargle with warm salt water. The discomfort should disappear within 24 hours.  If you had a scopolamine patch placed behind your ear for the management of post- operative nausea and/or vomiting:  1. The medication in the patch is effective for 72 hours, after which it should be removed.  Wrap patch in a tissue and discard in the trash. Wash hands thoroughly with soap and water. 2. You may remove the patch earlier than 72 hours if you experience unpleasant side effects which may include dry mouth, dizziness or visual disturbances. 3. Avoid touching the patch. Wash your hands with soap and water after contact with the patch.

## 2020-08-20 NOTE — Transfer of Care (Signed)
Immediate Anesthesia Transfer of Care Note  Patient: [name]  Procedure(s) Performed: ALVEOLOPLASTY (Bilateral Mouth)  Patient Location: PACU  Anesthesia Type:General  Level of Consciousness: sedated  Airway & Oxygen Therapy: Patient Spontanous Breathing and Patient connected to face mask oxygen  Post-op Assessment: Report given to RN and Post -op Vital signs reviewed and stable  Post vital signs: Reviewed and stable  Last Vitals:  Vitals Value Taken Time  BP 123/69 08/20/20 0830  Temp    Pulse 128 08/20/20 0833  Resp 14 08/20/20 0833  SpO2 99 % 08/20/20 0833  Vitals shown include unvalidated device data.  Last Pain:  Vitals:   08/20/20 0638  PainSc: 0-No pain      Patients Stated Pain Goal: 4 (17/71/16 5790)  Complications: No complications documented.

## 2020-08-21 ENCOUNTER — Encounter (HOSPITAL_BASED_OUTPATIENT_CLINIC_OR_DEPARTMENT_OTHER): Payer: Self-pay | Admitting: Oral Surgery

## 2020-09-18 ENCOUNTER — Emergency Department (HOSPITAL_COMMUNITY): Payer: Medicare Other

## 2020-09-18 ENCOUNTER — Emergency Department (HOSPITAL_COMMUNITY)
Admission: EM | Admit: 2020-09-18 | Discharge: 2020-09-18 | Disposition: A | Discharge status: Home / Self Care | Payer: Medicare Other | Attending: Emergency Medicine | Admitting: Emergency Medicine

## 2020-09-18 DIAGNOSIS — S90911A Unspecified superficial injury of right ankle, initial encounter: Secondary | ICD-10-CM | POA: Diagnosis present

## 2020-09-18 DIAGNOSIS — Z7982 Long term (current) use of aspirin: Secondary | ICD-10-CM | POA: Diagnosis not present

## 2020-09-18 DIAGNOSIS — G309 Alzheimer's disease, unspecified: Secondary | ICD-10-CM | POA: Diagnosis not present

## 2020-09-18 DIAGNOSIS — S82841A Displaced bimalleolar fracture of right lower leg, initial encounter for closed fracture: Secondary | ICD-10-CM | POA: Diagnosis not present

## 2020-09-18 DIAGNOSIS — W19XXXA Unspecified fall, initial encounter: Secondary | ICD-10-CM

## 2020-09-18 DIAGNOSIS — F0280 Dementia in other diseases classified elsewhere without behavioral disturbance: Secondary | ICD-10-CM | POA: Diagnosis not present

## 2020-09-18 DIAGNOSIS — R600 Localized edema: Secondary | ICD-10-CM | POA: Diagnosis not present

## 2020-09-18 DIAGNOSIS — W172XXA Fall into hole, initial encounter: Secondary | ICD-10-CM | POA: Insufficient documentation

## 2020-09-18 LAB — CBC WITH DIFFERENTIAL/PLATELET
Abs Immature Granulocytes: 0.03 10*3/uL (ref 0.00–0.07)
Basophils Absolute: 0 10*3/uL (ref 0.0–0.1)
Basophils Relative: 0 %
Eosinophils Absolute: 0.1 10*3/uL (ref 0.0–0.5)
Eosinophils Relative: 1 %
HCT: 40.2 % (ref 36.0–46.0)
Hemoglobin: 13.2 g/dL (ref 12.0–15.0)
Immature Granulocytes: 0 %
Lymphocytes Relative: 14 %
Lymphs Abs: 1.2 10*3/uL (ref 0.7–4.0)
MCH: 28.5 pg (ref 26.0–34.0)
MCHC: 32.8 g/dL (ref 30.0–36.0)
MCV: 86.8 fL (ref 80.0–100.0)
Monocytes Absolute: 0.7 10*3/uL (ref 0.1–1.0)
Monocytes Relative: 8 %
Neutro Abs: 6.7 10*3/uL (ref 1.7–7.7)
Neutrophils Relative %: 77 %
Platelets: 254 10*3/uL (ref 150–400)
RBC: 4.63 MIL/uL (ref 3.87–5.11)
RDW: 18 % — ABNORMAL HIGH (ref 11.5–15.5)
WBC: 8.6 10*3/uL (ref 4.0–10.5)
nRBC: 0 % (ref 0.0–0.2)

## 2020-09-18 LAB — BASIC METABOLIC PANEL
Anion gap: 8 (ref 5–15)
BUN: 18 mg/dL (ref 8–23)
CO2: 23 mmol/L (ref 22–32)
Calcium: 9.1 mg/dL (ref 8.9–10.3)
Chloride: 107 mmol/L (ref 98–111)
Creatinine, Ser: 0.85 mg/dL (ref 0.44–1.00)
GFR, Estimated: >60 mL/min (ref >60)
Glucose, Bld: 119 mg/dL — ABNORMAL HIGH (ref 70–99)
Potassium: 4.4 mmol/L (ref 3.5–5.1)
Sodium: 138 mmol/L (ref 135–145)

## 2020-09-18 LAB — PROTIME-INR
INR: 0.9 (ref 0.8–1.2)
Prothrombin Time: 12.1 seconds (ref 11.4–15.2)

## 2020-09-18 MED ORDER — OXYCODONE-ACETAMINOPHEN 5-325 MG PO TABS
1.0000 | ORAL_TABLET | ORAL | Status: DC | PRN
  Administered 2020-09-18: 2 via ORAL
  Filled 2020-09-18: qty 2

## 2020-09-18 MED ORDER — BUPROPION HCL ER (XL) 150 MG PO TB24: 300.0000 mg | ORAL_TABLET | ORAL | Status: DC

## 2020-09-18 MED ORDER — OXYCODONE-ACETAMINOPHEN 5-325 MG PO TABS
1.0000 | ORAL_TABLET | Freq: Once | ORAL | Status: AC
  Administered 2020-09-18: 1 via ORAL
  Filled 2020-09-18: qty 1

## 2020-09-18 MED ORDER — HYDROMORPHONE HCL 1 MG/ML IJ SOLN
1.0000 mg | Freq: Once | INTRAMUSCULAR | Status: AC
  Administered 2020-09-18: 1 mg via INTRAVENOUS
  Filled 2020-09-18: qty 1

## 2020-09-18 MED ORDER — BENZTROPINE MESYLATE 1 MG PO TABS
1.0000 mg | ORAL_TABLET | Freq: Two times a day (BID) | ORAL | Status: DC
  Administered 2020-09-18: 1 mg via ORAL
  Filled 2020-09-18: qty 1

## 2020-09-18 MED ORDER — ZOLPIDEM TARTRATE 5 MG PO TABS: 5.0000 mg | ORAL_TABLET | Freq: Every day | ORAL | Status: DC

## 2020-09-18 MED ORDER — PANTOPRAZOLE SODIUM 40 MG PO TBEC
40.0000 mg | DELAYED_RELEASE_TABLET | Freq: Every day | ORAL | Status: DC
  Administered 2020-09-18: 40 mg via ORAL
  Filled 2020-09-18: qty 1

## 2020-09-18 MED ORDER — CITALOPRAM HYDROBROMIDE 10 MG PO TABS: 40.0000 mg | ORAL_TABLET | ORAL | Status: DC

## 2020-09-18 MED ORDER — FENTANYL CITRATE (PF) 100 MCG/2ML IJ SOLN
50.0000 ug | Freq: Once | INTRAMUSCULAR | Status: AC
  Administered 2020-09-18: 50 ug via INTRAVENOUS
  Filled 2020-09-18: qty 2

## 2020-09-18 MED ORDER — QUETIAPINE FUMARATE ER 300 MG PO TB24
600.0000 mg | ORAL_TABLET | Freq: Every day | ORAL | Status: DC
  Filled 2020-09-18: qty 2

## 2020-09-18 NOTE — ED Triage Notes (Signed)
PT BIB [facility] after pt son called to report pt had fallen the day prior at 11:30 AM. According to EMS pt had tripped in a hole on the pavement while out with their dog. PTcrawled back to house but remained there until son checked on her this morning and she was unable to move r/t right ankle pain. PT denies LOC. Pt not on blood thinners. PT leg is currently in brace.   V/S  135/89 HR 82 96% RA 99.9 Temp Resp 20

## 2020-09-18 NOTE — ED Notes (Signed)
Called and left voicemail with son

## 2020-09-18 NOTE — ED Provider Notes (Signed)
[facility] EMERGENCY DEPARTMENT Provider Note   CSN: [id number] Arrival date & time: 09/18/20  1032     History Chief Complaint  Patient presents with  . Fall    [name] is a 69 y.o. female. She is brought in by EMS for evaluation of injuries from a fall last evening. She said her dog got out and she went out to get him and stepped into a hole in the pavement. Injuries to right ankle and foot. Unable to ambulate so crawled back into the house. Son found her there today and called EMS. Patient denies any other injuries. No loss of consciousness. Not on blood thinners.  The history is provided by the patient and the EMS personnel.  Fall This is a new problem. The current episode started yesterday. The problem has not changed since onset.Pertinent negatives include no chest pain, no abdominal pain, no headaches and no shortness of breath. Associated symptoms comments: r foot pain. The symptoms are aggravated by bending and twisting. Nothing relieves the symptoms. She has tried rest for the symptoms. The treatment provided no relief.       Past Medical History:  Diagnosis Date  . Alzheimer's dementia ([place])   . Depression   . Hypercholesteremia   . Schizophrenia ([place])     There are no problems to display for this patient.   Past Surgical History:  Procedure Laterality Date  . ABDOMINAL HYSTERECTOMY    . ALVEOLOPLASTY Bilateral 08/20/2020   Procedure: ALVEOLOPLASTY;  Surgeon: Diona Browner, DDS;  Location: [facility];  Service: Oral Surgery;  Laterality: Bilateral;  . CARPAL TUNNEL RELEASE    . MANDIBLE SURGERY    . TUMOR REMOVAL       OB History   No obstetric history on file.     No family history on file.  Social History   Tobacco Use  . Smoking status: Never Smoker  . Smokeless tobacco: Never Used  Vaping Use  . Vaping Use: Never used  Substance Use Topics  . Alcohol use: No  . Drug use: No    Home  Medications Prior to Admission medications   Medication Sig Start Date End Date Taking? Authorizing Provider  aspirin EC 81 MG tablet Take 81 mg by mouth every morning.     [provider]  benztropine (COGENTIN) 1 MG tablet Take 1 mg by mouth 2 (two) times daily.     [provider]  BIOTIN PO Take 1 tablet by mouth every morning.     [provider]  buPROPion (WELLBUTRIN XL) 300 MG 24 hr tablet Take 300 mg by mouth every morning.     [provider]  citalopram (CELEXA) 40 MG tablet Take 40 mg by mouth every morning.     [provider]  erythromycin ophthalmic ointment Place a 1/2 inch ribbon of ointment into the lower eyelid 4 times per day for the next 3 days 05/24/14   Sherwood Gambler, MD  fish oil-omega-3 fatty acids 1000 MG capsule Take 1 g by mouth 2 (two) times daily.     [provider]  ibuprofen (ADVIL) 600 MG tablet Take 1 tablet (600 mg total) by mouth every 6 (six) hours as needed. 08/20/20   Diona Browner, DDS  nystatin-triamcinolone (MYCOLOG) ointment Apply 1 application topically 3 (three) times daily as needed. For rash on arm    [provider]  omeprazole (PRILOSEC) 40 MG capsule Take 40 mg by mouth every morning.  [provider]  ondansetron (ZOFRAN ODT) 4 MG disintegrating tablet 4mg  ODT q4 hours prn nausea/vomit 11/18/14   [name], MD  potassium chloride SA (K-DUR,KLOR-CON) 20 MEQ tablet Take 1 tablet (20 mEq total) by mouth daily. 11/18/14   [name], MD  QUEtiapine (SEROQUEL XR) 300 MG 24 hr tablet Take 600 mg by mouth at bedtime.     [provider]  simvastatin (ZOCOR) 80 MG tablet Take 80 mg by mouth at bedtime.     [provider]  zolpidem (AMBIEN) 5 MG tablet Take 5 mg by mouth at bedtime. For sleep    [provider]    Allergies    Patient has no known allergies.  Review of Systems   Review of Systems  Constitutional: Negative for fever.  HENT:  Negative for sore throat.   Eyes: Negative for visual disturbance.  Respiratory: Negative for shortness of breath.   Cardiovascular: Negative for chest pain.  Gastrointestinal: Negative for abdominal pain.  Genitourinary: Negative for dysuria.  Musculoskeletal: Positive for gait problem. Negative for back pain and neck pain.  Skin: Negative for rash.  Neurological: Negative for headaches.    Physical Exam Updated Vital Signs BP 135/89 (BP Location: Left Arm)   Pulse 82   Temp 99.9 F (37.7 C)   Resp 20   Ht 5\' 5"  (1.651 m)   Wt 90.3 kg   SpO2 96%   BMI 33.12 kg/m   Physical Exam Vitals and nursing note reviewed.  Constitutional:      General: She is not in acute distress.    Appearance: Normal appearance. She is well-developed.  HENT:     Head: Normocephalic and atraumatic.  Eyes:     Conjunctiva/sclera: Conjunctivae normal.  Cardiovascular:     Rate and Rhythm: Normal rate and regular rhythm.     Heart sounds: No murmur heard.   Pulmonary:     Effort: Pulmonary effort is normal. No respiratory distress.     Breath sounds: Normal breath sounds.  Abdominal:     Palpations: Abdomen is soft.     Tenderness: There is no abdominal tenderness.  Musculoskeletal:        General: Swelling, tenderness, deformity and signs of injury present.     Cervical back: Neck supple.     Comments: Full major motion of bilateral upper extremities without any pain or limitations. Left lower extremity full range of motion without any pain or limitation right lower extremity nontender hip and knee. Ankle tender moderately swollen, foot moderately swollen proximally. DP pulse intact. Distal cap refill and sensation intact.  Skin:    General: Skin is warm and dry.     Capillary Refill: Capillary refill takes less than 2 seconds.  Neurological:     General: No focal deficit present.     Mental Status: She is alert.     Sensory: No sensory deficit.     Motor: No weakness.     ED Results /  Procedures / Treatments   Labs (all labs ordered are listed, but only abnormal results are displayed) Labs Reviewed  BASIC METABOLIC PANEL - Abnormal; Notable for the following components:      Result Value   Glucose, Bld 119 (*)    All other components within normal limits  CBC WITH DIFFERENTIAL/PLATELET - Abnormal; Notable for the following components:   RDW 18.0 (*)    All other components within normal limits  PROTIME-INR    EKG None  Radiology DG Tibia/Fibula  Right  Result Date: 09/18/2020 CLINICAL DATA:  Fall. EXAM: RIGHT TIBIA AND FIBULA - 2 VIEW COMPARISON:  Right ankle and foot 09/18/2020 FINDINGS: Nondisplaced fracture distal fibula. Displaced fracture medial malleolus. This fracture extends into the ankle joint. No other fracture identified in the tibia or fibula. Negative knee. IMPRESSION: Bimalleolar ankle fracture Electronically Signed   By: Franchot Gallo M.D.   On: 09/18/2020 11:34   DG Ankle Complete Right  Result Date: 09/18/2020 CLINICAL DATA:  Fall. EXAM: RIGHT ANKLE - COMPLETE 3+ VIEW COMPARISON:  None. FINDINGS: Nondisplaced fracture distal fibula. Displaced fracture medial malleolus. Posterior malleolus intact. Diffuse soft tissue swelling. IMPRESSION: Bimalleolar ankle fracture.  Displaced fracture medial malleolus. Electronically Signed   By: Franchot Gallo M.D.   On: 09/18/2020 11:32   DG Foot Complete Right  Result Date: 09/18/2020 CLINICAL DATA:  Fall.  Ankle injury. EXAM: RIGHT FOOT COMPLETE - 3+ VIEW COMPARISON:  None. FINDINGS: Negative for foot fracture.  No arthropathy Bimalleolar ankle fracture.  Mild widening of the ankle joint space. IMPRESSION: Negative for foot fracture Bimalleolar ankle fracture. Electronically Signed   By: Franchot Gallo M.D.   On: 09/18/2020 11:33    Procedures Procedures (including critical care time)  Medications Ordered in ED Medications  oxyCODONE-acetaminophen (PERCOCET/ROXICET) 5-325 MG per tablet 1-2 tablet (has  no administration in time range)  benztropine (COGENTIN) tablet 1 mg (1 mg Oral Given 09/18/20 1614)  buPROPion (WELLBUTRIN XL) 24 hr tablet 300 mg (has no administration in time range)  citalopram (CELEXA) tablet 40 mg (has no administration in time range)  pantoprazole (PROTONIX) EC tablet 40 mg (40 mg Oral Given 09/18/20 1614)  QUEtiapine (SEROQUEL XR) 24 hr tablet 600 mg (has no administration in time range)  zolpidem (AMBIEN) tablet 5 mg (has no administration in time range)  fentaNYL (SUBLIMAZE) injection 50 mcg (50 mcg Intravenous Given 09/18/20 1159)  oxyCODONE-acetaminophen (PERCOCET/ROXICET) 5-325 MG per tablet 1 tablet (1 tablet Oral Given 09/18/20 1501)    ED Course  I have reviewed the triage vital signs and the nursing notes.  Pertinent labs & imaging results that were available during my care of the patient were reviewed by me and considered in my medical decision making (see chart for details).  Clinical Course as of Sep 18 1858  Wed Sep 18, 2020  1150 Discussed with Orion Crook orthopedic PA who is going to discuss with Dr. Stann Mainland management plan.   [MB]    Clinical Course User Index [MB] Hayden Rasmussen, MD   MDM Rules/Calculators/A&P                         This patient complains of right foot and ankle pain after fall; this involves an extensive number of treatment Options and is a complaint that carries with it a high risk of complications and Morbidity. The differential includes fracture, dislocation, sprain, contusion  I ordered, reviewed and interpreted labs, which included CBC with normal white count normal hemoglobin, chemistries normal mildly elevated glucose likely reactive, normal coags I ordered medication IV and oral pain medicine I ordered imaging studies which included x-rays of right tib-fib ankle and foot and I independently    visualized and interpreted imaging which showed bimalleolar fracture Previous records obtained and reviewed in  epic, no recent admissions I consulted orthopedics PA Jeffries and discussed lab and imaging findings  Critical Interventions: None  After the interventions stated above, I reevaluated the patient and found patient to be  improved after splinting by orthopedic technicians.  Normal CSMs after application.  I consulted transitions of care and physical therapy as I felt the patient would be unsafe to return home in her nonweightbearing status.  Patient's care signed out to oncoming provider Dr. Darl Householder to follow-up on recommendations from transitions of care and physical therapy.   Final Clinical Impression(s) / ED Diagnoses Final diagnoses:  Bimalleolar fracture, right, closed, initial encounter  Fall, initial encounter    Rx / DC Orders ED Discharge Orders         [place]        09/18/20 1713    Face-to-face encounter (required for Medicare/Medicaid patients)       Comments: I Wandra Arthurs certify that this patient is under my care and that I, or a nurse practitioner or physician's assistant working with me, had a face-to-face encounter that meets the physician face-to-face encounter requirements with this patient on 09/18/2020. The encounter with the patient was in whole, or in part for the following medical condition(s) which is the primary reason for home health care (List medical condition): R ankle fracture. Further orders from PCP   09/18/20 1713           Hayden Rasmussen, MD 09/18/20 1902

## 2020-09-18 NOTE — Evaluation (Signed)
Physical Therapy Evaluation Patient Details Name: [name] MRN: [id number] DOB: [birth year] Today's Date: 09/18/2020   History of Present Illness  Pt is a 69 y/o female presenting to the ED secondary to fall. Pt with R Ankle fx. PMH includes dementia and schizophrenia.   Clinical Impression  Pt presenting with problem above with deficits below. Pt only able to take one hop step using RW secondary to pain. Required min to mod A for stability. Per pt, son is available but not all the time. Pt at increased risk for falls and has 2 steps to enter home. Feel she would benefit from SNF level therapies prior to d/c home, however, on discussion, pt reports she wants to go home. If pt refuses SNF, will require 24/7 support, max HH services and DME below. Will continue to follow acutely.     Follow Up Recommendations SNF;Supervision/Assistance - 24 hour (HHPT/OT/aide/RN if pt refusing SNF)    Equipment Recommendations  Wheelchair (measurements PT);Wheelchair cushion (measurements PT);Rolling walker with 5" wheels;3in1 (PT);Other (comment) (tub bench)    Recommendations for Other Services       Precautions / Restrictions Precautions Precautions: Fall Restrictions Weight Bearing Restrictions: Yes RLE Weight Bearing: Non weight bearing      Mobility  Bed Mobility Overal bed mobility: Needs Assistance Bed Mobility: Supine to Sit;Sit to Supine     Supine to sit: Min assist Sit to supine: Supervision   General bed mobility comments: Min A for trunk elevation.     Transfers Overall transfer level: Needs assistance Equipment used: Rolling walker (2 wheeled) Transfers: Sit to/from Stand Sit to Stand: Min assist         General transfer comment: Min A For steadying assist to stand from stretcher. Increased pain reported.   Ambulation/Gait Ambulation/Gait assistance: Min assist;Mod assist   Assistive device: Rolling walker (2 wheeled)   Gait velocity: Decreased   General  Gait Details: Pt hopping forwards and backwards X1 with min to mod A. Not able to tolerate further mobility secondary to pain so returned to stretcher.   Stairs            Wheelchair Mobility    Modified Rankin (Stroke Patients Only)       Balance Overall balance assessment: Needs assistance Sitting-balance support: No upper extremity supported;Feet supported Sitting balance-Leahy Scale: Fair     Standing balance support: Bilateral upper extremity supported;During functional activity Standing balance-Leahy Scale: Poor Standing balance comment: Reliant on BUE support                              Pertinent Vitals/Pain Pain Assessment: Faces Faces Pain Scale: Hurts whole lot Pain Location: R Ankle  Pain Descriptors / Indicators: Grimacing;Guarding;Moaning Pain Intervention(s): Monitored during session;Limited activity within patient's tolerance;Repositioned    Home Living Family/patient expects to be discharged to:: Private residence Living Arrangements: Children Available Help at Discharge: Family Type of Home: House Home Access: Stairs to enter   Technical brewer of Steps: 2 Home Layout: One level Home Equipment: None Additional Comments: Pt reports son is available most of the time, however, son was not present the night when she fell.     Prior Function Level of Independence: Independent               Hand Dominance        Extremity/Trunk Assessment   Upper Extremity Assessment Upper Extremity Assessment: Defer to OT evaluation    Lower Extremity  Assessment Lower Extremity Assessment: RLE deficits/detail RLE Deficits / Details: R Ankle in splint    Cervical / Trunk Assessment Cervical / Trunk Assessment: Normal  Communication   Communication: No difficulties  Cognition Arousal/Alertness: Awake/alert Behavior During Therapy: WFL for tasks assessed/performed Overall Cognitive Status: No family/caregiver present to determine  baseline cognitive functioning                                 General Comments: Decreased awareness of safety and current deficits. Dementia and schizophrenia at baseline.       General Comments      Exercises     Assessment/Plan    PT Assessment Patient needs continued PT services  PT Problem List Decreased strength;Decreased balance;Decreased mobility;Decreased activity tolerance;Decreased knowledge of use of DME;Decreased safety awareness;Decreased knowledge of precautions;Decreased cognition;Pain       PT Treatment Interventions DME instruction;Functional mobility training;Gait training;Therapeutic activities;Therapeutic exercise;Balance training;Wheelchair mobility training;Patient/family education    PT Goals (Current goals can be found in the Care Plan section)  Acute Rehab PT Goals Patient Stated Goal: to go home PT Goal Formulation: With patient Time For Goal Achievement: 10/02/20 Potential to Achieve Goals: Good    Frequency Min 3X/week   Barriers to discharge        Co-evaluation               AM-PAC PT "6 Clicks" Mobility  Outcome Measure Help needed turning from your back to your side while in a flat bed without using bedrails?: A Little Help needed moving from lying on your back to sitting on the side of a flat bed without using bedrails?: A Little Help needed moving to and from a bed to a chair (including a wheelchair)?: A Lot Help needed standing up from a chair using your arms (e.g., wheelchair or bedside chair)?: A Little Help needed to walk in hospital room?: A Lot Help needed climbing 3-5 steps with a railing? : Total 6 Click Score: 14    End of Session Equipment Utilized During Treatment: Gait belt Activity Tolerance: Patient limited by pain Patient left: in bed;with call bell/phone within reach (on stretcher in ED ) Nurse Communication: Mobility status PT Visit Diagnosis: Unsteadiness on feet (R26.81);Muscle weakness  (generalized) (M62.81);History of falling (Z91.81);Difficulty in walking, not elsewhere classified (R26.2);Pain Pain - Right/Left: Right Pain - part of body: Ankle and joints of foot    Time: 1450-1509 PT Time Calculation (min) (ACUTE ONLY): 19 min   Charges:   PT Evaluation $PT Eval Moderate Complexity: 1 Mod          [name], PT, DPT  Acute Rehabilitation Services  Pager: [phone] Office: [phone]   Rudean Hitt 09/18/2020, 4:56 PM

## 2020-09-18 NOTE — ED Provider Notes (Addendum)
  Physical Exam  BP 121/78 (BP Location: Right Arm)   Pulse 67   Temp (!) 97.2 F (36.2 C) (Oral)   Resp 18   Ht 5\' 5"  (1.651 m)   Wt 90.3 kg   SpO2 99%   BMI 33.12 kg/m   Physical Exam  ED Course/Procedures   Clinical Course as of Sep 19 1855  Wed Sep 18, 2020  1150 Discussed with Orion Crook orthopedic PA who is going to discuss with Dr. Stann Mainland management plan.   [MB]    Clinical Course User Index [MB] Hayden Rasmussen, MD    Procedures  MDM  Care assumed at 3 PM.  Patient has a bimalleolar ankle fracture.  Patient is unable to be steady with a crutches.  Ortho saw patient and recommend nonweightbearing and possible surgery and outpatient follow-up.  Signout pending Case management consult for either placement or equipment at home as well as physical therapy consult.  6:57 PM Since patient needs surgery, patient will not qualify for rehab.  Ordered home health and ordered wheelchair to go home.  Will need follow-up with Dr. Stann Mainland next week for surgery.  Of note, patient is on chronic Percocet and just filled 120 tablets a week ago.  I told her that she likely has a pain contract and if she runs early she will need to let her pain doctor know.      Drenda Freeze, MD 09/18/20 Mauro Kaufmann    Drenda Freeze, MD 09/18/20 1900

## 2020-09-18 NOTE — ED Notes (Signed)
Called son and left a second voicemail

## 2020-09-18 NOTE — ED Notes (Signed)
Charge nurse made aware of patients discharge status

## 2020-09-18 NOTE — Progress Notes (Signed)
Orthopedic Tech Progress Note Patient Details:  [name] Jun 27, 1951 [id number]  Ortho Devices Type of Ortho Device: Post (short leg) splint Ortho Device/Splint Location: RLE Ortho Device/Splint Interventions: Ordered, Application, Adjustment   Post Interventions Patient Tolerated: Well Instructions Provided: Care of device, Poper ambulation with device   Aarik Blank 09/18/2020, 1:52 PM

## 2020-09-18 NOTE — ED Notes (Signed)
Ortho tech called 

## 2020-09-18 NOTE — Discharge Instructions (Addendum)
Ice over splint for 30 minutes 4 times per day.  Keep leg elevated whenever possible.   Do not put weight down on right leg.  Please use wheelchair.   See Dr. Stann Mainland this week.  Take percocet as prescribed by your pain doctor. If you run out early, please call him to get extra pills   Return to ER if you have worse ankle pain or swelling

## 2020-09-18 NOTE — Consult Note (Signed)
Reason for Consult:Right ankle fx Referring Physician: [name] is an 69 y.o. female.  HPI: [name] was outside her home last night and stepped in a hole. She had immediate pain and could not bear weight on her ankle. She crawled back inside and was finally able to get someone to bring her to the ED this morning. X-rays showed a right ankle fx and orthopedic surgery was consulted. She lives at home with her son.  Past Medical History:  Diagnosis Date   Alzheimer's dementia ([place])    Depression    Hypercholesteremia    Schizophrenia [facility])     Past Surgical History:  Procedure Laterality Date   ABDOMINAL HYSTERECTOMY     ALVEOLOPLASTY Bilateral 08/20/2020   Procedure: ALVEOLOPLASTY;  Surgeon: Diona Browner, DDS;  Location: [facility];  Service: Oral Surgery;  Laterality: Bilateral;   CARPAL TUNNEL RELEASE     MANDIBLE SURGERY     TUMOR REMOVAL      No family history on file.  Social History:  reports that she has never smoked. She has never used smokeless tobacco. She reports that she does not drink alcohol and does not use drugs.  Allergies: No Known Allergies  Medications: I have reviewed the patient's current medications.  No results found for this or any previous visit (from the past 48 hour(s)).  DG Tibia/Fibula Right  Result Date: 09/18/2020 CLINICAL DATA:  Fall. EXAM: RIGHT TIBIA AND FIBULA - 2 VIEW COMPARISON:  Right ankle and foot 09/18/2020 FINDINGS: Nondisplaced fracture distal fibula. Displaced fracture medial malleolus. This fracture extends into the ankle joint. No other fracture identified in the tibia or fibula. Negative knee. IMPRESSION: Bimalleolar ankle fracture Electronically Signed   By: Franchot Gallo M.D.   On: 09/18/2020 11:34   DG Ankle Complete Right  Result Date: 09/18/2020 CLINICAL DATA:  Fall. EXAM: RIGHT ANKLE - COMPLETE 3+ VIEW COMPARISON:  None. FINDINGS: Nondisplaced fracture distal fibula. Displaced  fracture medial malleolus. Posterior malleolus intact. Diffuse soft tissue swelling. IMPRESSION: Bimalleolar ankle fracture.  Displaced fracture medial malleolus. Electronically Signed   By: Franchot Gallo M.D.   On: 09/18/2020 11:32   DG Foot Complete Right  Result Date: 09/18/2020 CLINICAL DATA:  Fall.  Ankle injury. EXAM: RIGHT FOOT COMPLETE - 3+ VIEW COMPARISON:  None. FINDINGS: Negative for foot fracture.  No arthropathy Bimalleolar ankle fracture.  Mild widening of the ankle joint space. IMPRESSION: Negative for foot fracture Bimalleolar ankle fracture. Electronically Signed   By: Franchot Gallo M.D.   On: 09/18/2020 11:33    Review of Systems  HENT: Negative for ear discharge, ear pain, hearing loss and tinnitus.   Eyes: Negative for photophobia and pain.  Respiratory: Negative for cough and shortness of breath.   Cardiovascular: Negative for chest pain.  Gastrointestinal: Negative for abdominal pain, nausea and vomiting.  Genitourinary: Negative for dysuria, flank pain, frequency and urgency.  Musculoskeletal: Positive for arthralgias (Right ankle). Negative for back pain, myalgias and neck pain.  Neurological: Negative for dizziness and headaches.  Hematological: Does not bruise/bleed easily.  Psychiatric/Behavioral: The patient is not nervous/anxious.    Blood pressure 133/79, pulse 74, temperature 99.9 F (37.7 C), resp. rate 14, height 5\' 5"  (1.651 m), weight 90.3 kg, SpO2 94 %. Physical Exam Constitutional:      General: She is not in acute distress.    Appearance: She is well-developed. She is not diaphoretic.  HENT:     Head: Normocephalic and atraumatic.  Eyes:  General: No scleral icterus.       Right eye: No discharge.        Left eye: No discharge.     Conjunctiva/sclera: Conjunctivae normal.  Cardiovascular:     Rate and Rhythm: Normal rate and regular rhythm.  Pulmonary:     Effort: Pulmonary effort is normal. No respiratory distress.  Musculoskeletal:      Cervical back: Normal range of motion.     Comments: LLE No traumatic wounds or rash  Ankle with mod edema, mild ecchymosis, mod TTP  No knee effusion  Knee stable to varus/ valgus and anterior/posterior stress  Sens DPN, SPN, TN intact  Motor EHL, ext, flex, evers 5/5  DP 1+, PT 1+, No significant pedal edema  Skin:    General: Skin is warm and dry.  Neurological:     Mental Status: She is alert.  Psychiatric:        Behavior: Behavior normal.     Assessment/Plan: Right ankle fx -- Splint, NWB. F/u with Dr. Stann Mainland next week.    Lisette Abu, PA-C Orthopedic Surgery [phone] 09/18/2020, 12:14 PM

## 2020-09-27 ENCOUNTER — Other Ambulatory Visit: Payer: Self-pay

## 2020-09-27 ENCOUNTER — Encounter (HOSPITAL_COMMUNITY): Payer: Self-pay | Admitting: Orthopedic Surgery

## 2020-09-27 NOTE — Progress Notes (Addendum)
COVID Vaccine Completed: Yes Date COVID Vaccine completed: X2 COVID vaccine manufacturer:    Moderna     PCP - Dr. Sandi Mariscal Cardiologist - N/A  Chest x-ray - 06/08/20 in epic EKG - 06/10/20 in epic Stress Test - N/A ECHO - N/A Cardiac Cath - N/A Pacemaker/ICD device last checked: N/A  Sleep Study - N/A CPAP - N/A  Fasting Blood Sugar - N/A Checks Blood Sugar _N/A____ times a day  Blood Thinner Instructions: N/A Aspirin Instructions:N/A Last Dose:N/A  Anesthesia review: N/A  Patient denies shortness of breath, fever, cough and chest pain at PAT appointment   Patient verbalized understanding of instructions that were given to them at the PAT appointment. Patient was also instructed that they will need to review over the PAT instructions again at home before surgery.

## 2020-10-01 ENCOUNTER — Other Ambulatory Visit: Payer: Self-pay

## 2020-10-01 ENCOUNTER — Inpatient Hospital Stay (HOSPITAL_COMMUNITY)
Admission: RE | Admit: 2020-10-01 | Discharge: 2020-10-04 | DRG: 494 | Disposition: A | Discharge status: Home Health | Payer: Medicare Other | Attending: Orthopedic Surgery | Admitting: Orthopedic Surgery

## 2020-10-01 ENCOUNTER — Inpatient Hospital Stay (HOSPITAL_COMMUNITY): Payer: Medicare Other | Admitting: Certified Registered Nurse Anesthetist

## 2020-10-01 ENCOUNTER — Inpatient Hospital Stay (HOSPITAL_COMMUNITY): Payer: Medicare Other

## 2020-10-01 ENCOUNTER — Encounter (HOSPITAL_COMMUNITY)
Admission: RE | Disposition: A | Discharge status: Home Health | Payer: Self-pay | Source: Home / Self Care | Attending: Orthopedic Surgery

## 2020-10-01 ENCOUNTER — Encounter (HOSPITAL_COMMUNITY): Payer: Self-pay | Admitting: Orthopedic Surgery

## 2020-10-01 DIAGNOSIS — W1830XA Fall on same level, unspecified, initial encounter: Secondary | ICD-10-CM | POA: Diagnosis present

## 2020-10-01 DIAGNOSIS — R6884 Jaw pain: Secondary | ICD-10-CM | POA: Diagnosis present

## 2020-10-01 DIAGNOSIS — Z90710 Acquired absence of both cervix and uterus: Secondary | ICD-10-CM | POA: Diagnosis not present

## 2020-10-01 DIAGNOSIS — G309 Alzheimer's disease, unspecified: Secondary | ICD-10-CM | POA: Diagnosis present

## 2020-10-01 DIAGNOSIS — Z20822 Contact with and (suspected) exposure to covid-19: Secondary | ICD-10-CM | POA: Diagnosis present

## 2020-10-01 DIAGNOSIS — K219 Gastro-esophageal reflux disease without esophagitis: Secondary | ICD-10-CM | POA: Diagnosis present

## 2020-10-01 DIAGNOSIS — M25571 Pain in right ankle and joints of right foot: Secondary | ICD-10-CM | POA: Diagnosis present

## 2020-10-01 DIAGNOSIS — Z888 Allergy status to other drugs, medicaments and biological substances status: Secondary | ICD-10-CM

## 2020-10-01 DIAGNOSIS — F209 Schizophrenia, unspecified: Secondary | ICD-10-CM | POA: Diagnosis present

## 2020-10-01 DIAGNOSIS — Z79899 Other long term (current) drug therapy: Secondary | ICD-10-CM | POA: Diagnosis not present

## 2020-10-01 DIAGNOSIS — F32A Depression, unspecified: Secondary | ICD-10-CM | POA: Diagnosis present

## 2020-10-01 DIAGNOSIS — R9431 Abnormal electrocardiogram [ECG] [EKG]: Secondary | ICD-10-CM | POA: Diagnosis present

## 2020-10-01 DIAGNOSIS — Z419 Encounter for procedure for purposes other than remedying health state, unspecified: Secondary | ICD-10-CM

## 2020-10-01 DIAGNOSIS — Z7982 Long term (current) use of aspirin: Secondary | ICD-10-CM | POA: Diagnosis not present

## 2020-10-01 DIAGNOSIS — S82841A Displaced bimalleolar fracture of right lower leg, initial encounter for closed fracture: Secondary | ICD-10-CM | POA: Diagnosis present

## 2020-10-01 DIAGNOSIS — E7800 Pure hypercholesterolemia, unspecified: Secondary | ICD-10-CM | POA: Diagnosis present

## 2020-10-01 DIAGNOSIS — F0280 Dementia in other diseases classified elsewhere without behavioral disturbance: Secondary | ICD-10-CM | POA: Diagnosis present

## 2020-10-01 HISTORY — DX: Anemia, unspecified: D64.9

## 2020-10-01 HISTORY — PX: ORIF ANKLE FRACTURE: SHX5408

## 2020-10-01 HISTORY — DX: Carpal tunnel syndrome, bilateral upper limbs: G56.03

## 2020-10-01 HISTORY — DX: Gastro-esophageal reflux disease without esophagitis: K21.9

## 2020-10-01 LAB — SURGICAL PCR SCREEN
MRSA, PCR: POSITIVE — AB
Staphylococcus aureus: POSITIVE — AB

## 2020-10-01 LAB — SARS CORONAVIRUS 2 BY RT PCR (HOSPITAL ORDER, PERFORMED IN ~~LOC~~ HOSPITAL LAB): SARS Coronavirus 2: NEGATIVE

## 2020-10-01 SURGERY — OPEN REDUCTION INTERNAL FIXATION (ORIF) ANKLE FRACTURE
Anesthesia: General | Site: Ankle | Laterality: Right

## 2020-10-01 MED ORDER — MIDAZOLAM HCL 2 MG/2ML IJ SOLN
INTRAMUSCULAR | Status: AC
  Filled 2020-10-01: qty 2

## 2020-10-01 MED ORDER — HYDROXYZINE HCL 50 MG PO TABS
50.0000 mg | ORAL_TABLET | Freq: Three times a day (TID) | ORAL | Status: DC | PRN
  Filled 2020-10-01: qty 1

## 2020-10-01 MED ORDER — NAPROXEN 250 MG PO TABS
250.0000 mg | ORAL_TABLET | Freq: Two times a day (BID) | ORAL | Status: DC
  Administered 2020-10-02 – 2020-10-04 (×5): 250 mg via ORAL
  Filled 2020-10-01 (×5): qty 1

## 2020-10-01 MED ORDER — ONDANSETRON HCL 4 MG PO TABS: 4.0000 mg | ORAL_TABLET | Freq: Four times a day (QID) | ORAL | Status: DC | PRN

## 2020-10-01 MED ORDER — MORPHINE SULFATE (PF) 2 MG/ML IV SOLN
0.5000 mg | INTRAVENOUS | Status: DC | PRN
  Filled 2020-10-01: qty 1

## 2020-10-01 MED ORDER — FENTANYL CITRATE (PF) 250 MCG/5ML IJ SOLN
INTRAMUSCULAR | Status: AC
  Filled 2020-10-01: qty 5

## 2020-10-01 MED ORDER — 0.9 % SODIUM CHLORIDE (POUR BTL) OPTIME
TOPICAL | Status: DC | PRN
  Administered 2020-10-01: 1000 mL

## 2020-10-01 MED ORDER — BUPROPION HCL ER (XL) 300 MG PO TB24
300.0000 mg | ORAL_TABLET | Freq: Every day | ORAL | Status: DC
  Administered 2020-10-01 – 2020-10-03 (×3): 300 mg via ORAL
  Filled 2020-10-01 (×3): qty 1

## 2020-10-01 MED ORDER — PANTOPRAZOLE SODIUM 40 MG PO TBEC
40.0000 mg | DELAYED_RELEASE_TABLET | Freq: Every day | ORAL | Status: DC
  Administered 2020-10-01 – 2020-10-04 (×4): 40 mg via ORAL
  Filled 2020-10-01 (×4): qty 1

## 2020-10-01 MED ORDER — OXYCODONE HCL 5 MG PO TABS: 5.0000 mg | ORAL_TABLET | Freq: Once | ORAL | Status: DC | PRN

## 2020-10-01 MED ORDER — LACTATED RINGERS IV SOLN: INTRAVENOUS | Status: DC

## 2020-10-01 MED ORDER — ASPIRIN EC 81 MG PO TBEC
81.0000 mg | DELAYED_RELEASE_TABLET | ORAL | Status: DC
  Administered 2020-10-02 – 2020-10-04 (×3): 81 mg via ORAL
  Filled 2020-10-01 (×3): qty 1

## 2020-10-01 MED ORDER — RAMELTEON 8 MG PO TABS
8.0000 mg | ORAL_TABLET | Freq: Every day | ORAL | Status: DC
  Administered 2020-10-01 – 2020-10-03 (×3): 8 mg via ORAL
  Filled 2020-10-01 (×3): qty 1

## 2020-10-01 MED ORDER — PROPOFOL 10 MG/ML IV BOLUS
INTRAVENOUS | Status: AC
  Filled 2020-10-01: qty 20

## 2020-10-01 MED ORDER — OXYCODONE HCL 5 MG/5ML PO SOLN: 5.0000 mg | Freq: Once | ORAL | Status: DC | PRN

## 2020-10-01 MED ORDER — MIDAZOLAM HCL 2 MG/2ML IJ SOLN
1.0000 mg | INTRAMUSCULAR | Status: DC
  Administered 2020-10-01: 1 mg via INTRAVENOUS
  Filled 2020-10-01: qty 2

## 2020-10-01 MED ORDER — LIDOCAINE 2% (20 MG/ML) 5 ML SYRINGE
INTRAMUSCULAR | Status: DC | PRN
  Administered 2020-10-01: 40 mg via INTRAVENOUS

## 2020-10-01 MED ORDER — FENTANYL CITRATE (PF) 100 MCG/2ML IJ SOLN
50.0000 ug | INTRAMUSCULAR | Status: DC
  Administered 2020-10-01: 50 ug via INTRAVENOUS
  Filled 2020-10-01: qty 2

## 2020-10-01 MED ORDER — PROPOFOL 10 MG/ML IV BOLUS
INTRAVENOUS | Status: DC | PRN
  Administered 2020-10-01: 50 mg via INTRAVENOUS
  Administered 2020-10-01: 150 mg via INTRAVENOUS

## 2020-10-01 MED ORDER — FENTANYL CITRATE (PF) 100 MCG/2ML IJ SOLN: 25.0000 ug | INTRAMUSCULAR | Status: DC | PRN

## 2020-10-01 MED ORDER — CEFAZOLIN SODIUM-DEXTROSE 2-4 GM/100ML-% IV SOLN
2.0000 g | INTRAVENOUS | Status: AC
  Administered 2020-10-01: 2 g via INTRAVENOUS
  Filled 2020-10-01: qty 100

## 2020-10-01 MED ORDER — SENNA 8.6 MG PO TABS
1.0000 | ORAL_TABLET | Freq: Two times a day (BID) | ORAL | Status: DC
  Administered 2020-10-01 – 2020-10-02 (×2): 8.6 mg via ORAL
  Filled 2020-10-01 (×5): qty 1

## 2020-10-01 MED ORDER — GALANTAMINE HYDROBROMIDE 4 MG PO TABS
12.0000 mg | ORAL_TABLET | Freq: Two times a day (BID) | ORAL | Status: DC
  Administered 2020-10-01 – 2020-10-04 (×6): 12 mg via ORAL
  Filled 2020-10-01 (×6): qty 3

## 2020-10-01 MED ORDER — MUPIROCIN 2 % EX OINT
TOPICAL_OINTMENT | Freq: Two times a day (BID) | CUTANEOUS | Status: DC
  Administered 2020-10-01 – 2020-10-02 (×2): 1 via NASAL
  Filled 2020-10-01: qty 22

## 2020-10-01 MED ORDER — DEXAMETHASONE SODIUM PHOSPHATE 10 MG/ML IJ SOLN
INTRAMUSCULAR | Status: DC | PRN
  Administered 2020-10-01: 10 mg via INTRAVENOUS

## 2020-10-01 MED ORDER — OXYCODONE HCL 5 MG PO TABS
5.0000 mg | ORAL_TABLET | ORAL | 0 refills | Status: AC | PRN
Start: 2020-10-01 — End: 2020-10-08

## 2020-10-01 MED ORDER — GABAPENTIN 300 MG PO CAPS
300.0000 mg | ORAL_CAPSULE | Freq: Every day | ORAL | Status: DC
  Administered 2020-10-01 – 2020-10-03 (×3): 300 mg via ORAL
  Filled 2020-10-01 (×3): qty 1

## 2020-10-01 MED ORDER — ONDANSETRON HCL 4 MG/2ML IJ SOLN: 4.0000 mg | Freq: Once | INTRAMUSCULAR | Status: DC | PRN

## 2020-10-01 MED ORDER — HYDROCODONE-ACETAMINOPHEN 5-325 MG PO TABS
1.0000 | ORAL_TABLET | ORAL | Status: DC | PRN
  Administered 2020-10-01 – 2020-10-02 (×4): 2 via ORAL
  Filled 2020-10-01 (×4): qty 2

## 2020-10-01 MED ORDER — METHOCARBAMOL 500 MG PO TABS: 500.0000 mg | ORAL_TABLET | Freq: Four times a day (QID) | ORAL | 1 refills | Status: DC | PRN

## 2020-10-01 MED ORDER — ONDANSETRON HCL 4 MG/2ML IJ SOLN: 4.0000 mg | Freq: Four times a day (QID) | INTRAMUSCULAR | Status: DC | PRN

## 2020-10-01 MED ORDER — ATORVASTATIN CALCIUM 40 MG PO TABS
40.0000 mg | ORAL_TABLET | Freq: Every day | ORAL | Status: DC
  Administered 2020-10-01 – 2020-10-04 (×4): 40 mg via ORAL
  Filled 2020-10-01 (×4): qty 1

## 2020-10-01 MED ORDER — AMISULPRIDE (ANTIEMETIC) 5 MG/2ML IV SOLN: 10.0000 mg | Freq: Once | INTRAVENOUS | Status: DC | PRN

## 2020-10-01 MED ORDER — HYDROCODONE-ACETAMINOPHEN 7.5-325 MG PO TABS
1.0000 | ORAL_TABLET | ORAL | Status: DC | PRN
  Administered 2020-10-03 – 2020-10-04 (×5): 2 via ORAL
  Filled 2020-10-01 (×5): qty 2

## 2020-10-01 MED ORDER — ROPIVACAINE HCL 5 MG/ML IJ SOLN
INTRAMUSCULAR | Status: DC | PRN
  Administered 2020-10-01: 15 mL via PERINEURAL
  Administered 2020-10-01: 20 mL via PERINEURAL

## 2020-10-01 MED ORDER — ACETAMINOPHEN 325 MG PO TABS
325.0000 mg | ORAL_TABLET | Freq: Four times a day (QID) | ORAL | Status: DC | PRN
  Administered 2020-10-04: 650 mg via ORAL
  Filled 2020-10-01: qty 2

## 2020-10-01 MED ORDER — ENOXAPARIN SODIUM 40 MG/0.4ML ~~LOC~~ SOLN
40.0000 mg | SUBCUTANEOUS | Status: DC
  Administered 2020-10-02 – 2020-10-04 (×3): 40 mg via SUBCUTANEOUS
  Filled 2020-10-01 (×3): qty 0.4

## 2020-10-01 MED ORDER — DEXAMETHASONE SODIUM PHOSPHATE 10 MG/ML IJ SOLN
INTRAMUSCULAR | Status: DC | PRN
  Administered 2020-10-01: 10 mg

## 2020-10-01 MED ORDER — CHLORHEXIDINE GLUCONATE 0.12 % MT SOLN
15.0000 mL | Freq: Once | OROMUCOSAL | Status: AC
  Administered 2020-10-01: 15 mL via OROMUCOSAL

## 2020-10-01 MED ORDER — FENTANYL CITRATE (PF) 250 MCG/5ML IJ SOLN
INTRAMUSCULAR | Status: DC | PRN
  Administered 2020-10-01 (×4): 50 ug via INTRAVENOUS

## 2020-10-01 MED ORDER — VITAMIN D 25 MCG (1000 UNIT) PO TABS
2000.0000 [IU] | ORAL_TABLET | Freq: Every day | ORAL | Status: DC
  Administered 2020-10-02 – 2020-10-04 (×3): 2000 [IU] via ORAL
  Filled 2020-10-01 (×3): qty 2

## 2020-10-01 MED ORDER — QUETIAPINE FUMARATE ER 300 MG PO TB24
600.0000 mg | ORAL_TABLET | Freq: Every day | ORAL | Status: DC
  Administered 2020-10-01 – 2020-10-03 (×3): 600 mg via ORAL
  Filled 2020-10-01 (×3): qty 2

## 2020-10-01 MED ORDER — CITALOPRAM HYDROBROMIDE 20 MG PO TABS
40.0000 mg | ORAL_TABLET | Freq: Every day | ORAL | Status: DC
  Administered 2020-10-01 – 2020-10-04 (×4): 40 mg via ORAL
  Filled 2020-10-01 (×4): qty 2

## 2020-10-01 MED ORDER — ORAL CARE MOUTH RINSE: 15.0000 mL | Freq: Once | OROMUCOSAL | Status: AC

## 2020-10-01 MED ORDER — OMEGA-3-ACID ETHYL ESTERS 1 G PO CAPS
1.0000 g | ORAL_CAPSULE | Freq: Every day | ORAL | Status: DC
  Administered 2020-10-02 – 2020-10-04 (×3): 1 g via ORAL
  Filled 2020-10-01 (×3): qty 1

## 2020-10-01 MED ORDER — ONDANSETRON HCL 4 MG/2ML IJ SOLN
INTRAMUSCULAR | Status: DC | PRN
  Administered 2020-10-01: 4 mg via INTRAVENOUS

## 2020-10-01 SURGICAL SUPPLY — 55 items
BAG ZIPLOCK 12X15 (MISCELLANEOUS) ×2 IMPLANT
BIT DRILL 2.5 CANN LNG (BIT) ×2 IMPLANT
BIT DRILL 2.5 CANN STRL (BIT) ×2 IMPLANT
BIT DRILL 2.6 CANN (BIT) ×2 IMPLANT
BNDG COHESIVE 4X5 TAN STRL (GAUZE/BANDAGES/DRESSINGS) ×2 IMPLANT
BNDG ELASTIC 4X5.8 VLCR STR LF (GAUZE/BANDAGES/DRESSINGS) ×2 IMPLANT
BNDG ELASTIC 6X5.8 VLCR STR LF (GAUZE/BANDAGES/DRESSINGS) ×2 IMPLANT
CHLORAPREP W/TINT 26 (MISCELLANEOUS) ×2 IMPLANT
COVER SURGICAL LIGHT HANDLE (MISCELLANEOUS) ×2 IMPLANT
COVER WAND RF STERILE (DRAPES) IMPLANT
CUFF TOURN SGL QUICK 34 (TOURNIQUET CUFF) ×1
CUFF TRNQT CYL 34X4.125X (TOURNIQUET CUFF) ×1 IMPLANT
DRAPE C-ARM 42X120 X-RAY (DRAPES) ×2 IMPLANT
DRAPE C-ARMOR (DRAPES) ×2 IMPLANT
DRAPE U-SHAPE 47X51 STRL (DRAPES) ×2 IMPLANT
DRSG ADAPTIC 3X8 NADH LF (GAUZE/BANDAGES/DRESSINGS) ×2 IMPLANT
DRSG PAD ABDOMINAL 8X10 ST (GAUZE/BANDAGES/DRESSINGS) ×4 IMPLANT
ELECT REM PT RETURN 15FT ADLT (MISCELLANEOUS) ×2 IMPLANT
GAUZE SPONGE 4X4 12PLY STRL (GAUZE/BANDAGES/DRESSINGS) ×2 IMPLANT
GLOVE BIO SURGEON STRL SZ7.5 (GLOVE) ×4 IMPLANT
GLOVE BIOGEL PI IND STRL 8 (GLOVE) ×2 IMPLANT
GLOVE BIOGEL PI INDICATOR 8 (GLOVE) ×2
GOWN STRL REUS W/TWL LRG LVL3 (GOWN DISPOSABLE) ×4 IMPLANT
GUIDEWIRE 1.35MM (WIRE) ×2 IMPLANT
KIT BASIN OR (CUSTOM PROCEDURE TRAY) ×2 IMPLANT
KIT TURNOVER KIT A (KITS) IMPLANT
MANIFOLD NEPTUNE II (INSTRUMENTS) ×2 IMPLANT
NS IRRIG 1000ML POUR BTL (IV SOLUTION) ×2 IMPLANT
PACK ORTHO EXTREMITY (CUSTOM PROCEDURE TRAY) ×2 IMPLANT
PAD CAST 4YDX4 CTTN HI CHSV (CAST SUPPLIES) ×1 IMPLANT
PADDING CAST COTTON 4X4 STRL (CAST SUPPLIES) ×1
PADDING CAST COTTON 6X4 STRL (CAST SUPPLIES) ×4 IMPLANT
PENCIL SMOKE EVACUATOR (MISCELLANEOUS) ×2 IMPLANT
PLATE LOCK MED HOOK 3H (Plate) ×2 IMPLANT
PROTECTOR NERVE ULNAR (MISCELLANEOUS) ×2 IMPLANT
PUTTY DBM STAGRAFT PLUS 5CC (Putty) ×2 IMPLANT
SCREW CANN 4.0X50 (Screw) ×1 IMPLANT
SCREW CANN T15 ST 50X4 ST (Screw) ×1 IMPLANT
SCREW CORT LP 3.5X60 (Screw) ×2 IMPLANT
SCREW LOW PROFILE 3.5X28 (Screw) ×2 IMPLANT
SCREW LOW PROFILE 3.5X30 (Screw) ×2 IMPLANT
SCREW LOW PROFILE 3.5X35 (Screw) ×2 IMPLANT
SPLINT PLASTER CAST XFAST 5X30 (CAST SUPPLIES) ×1 IMPLANT
SPLINT PLASTER XFAST SET 5X30 (CAST SUPPLIES) ×1
SUT ETHILON 3 0 PS 1 (SUTURE) ×6 IMPLANT
SUT MNCRL AB 4-0 PS2 18 (SUTURE) IMPLANT
SUT VIC AB 0 CT1 27 (SUTURE) ×1
SUT VIC AB 0 CT1 27XBRD ANTBC (SUTURE) ×1 IMPLANT
SUT VIC AB 1 CT1 36 (SUTURE) ×2 IMPLANT
SUT VIC AB 2-0 CT1 27 (SUTURE) ×1
SUT VIC AB 2-0 CT1 TAPERPNT 27 (SUTURE) ×1 IMPLANT
SUT VIC AB 2-0 CT2 27 (SUTURE) ×2 IMPLANT
TOWEL OR 17X26 10 PK STRL BLUE (TOWEL DISPOSABLE) ×2 IMPLANT
WATER STERILE IRR 500ML POUR (IV SOLUTION) ×2 IMPLANT
YANKAUER SUCT BULB TIP 10FT TU (MISCELLANEOUS) ×2 IMPLANT

## 2020-10-01 NOTE — Brief Op Note (Signed)
10/01/2020  4:13 PM  PATIENT:  [name]  69 y.o. female  PRE-OPERATIVE DIAGNOSIS:  Right ankle bimalleolar fracture  POST-OPERATIVE DIAGNOSIS:  Same  PROCEDURE:  OPEN REDUCTION INTERNAL FIXATION (ORIF) ANKLE FRACTURE  SURGEON:  Nicholes Stairs, MD   Assistant:  Jonelle Sidle, PA-C.  ANESTHESIA:   General  COMPLICATIONS: None

## 2020-10-01 NOTE — Transfer of Care (Signed)
Immediate Anesthesia Transfer of Care Note  Patient: [name]  Procedure(s) Performed: OPEN REDUCTION INTERNAL FIXATION (ORIF) ANKLE FRACTURE (Right Ankle)  Patient Location: PACU  Anesthesia Type:General  Level of Consciousness: awake  Airway & Oxygen Therapy: Patient Spontanous Breathing and Patient connected to face mask oxygen  Post-op Assessment: Report given to RN and Post -op Vital signs reviewed and stable  Post vital signs: Reviewed and stable  Last Vitals:  Vitals Value Taken Time  BP    Temp    Pulse 63 10/01/20 1644  Resp 19 10/01/20 1644  SpO2 93 % 10/01/20 1644  Vitals shown include unvalidated device data.  Last Pain:  Vitals:   10/01/20 1301  TempSrc:   PainSc: 8       Patients Stated Pain Goal: 5 (75/10/25 8527)  Complications: No complications documented.

## 2020-10-01 NOTE — H&P (Signed)
ORTHOPAEDIC H and P  REQUESTING PHYSICIAN: Anita Stairs, MD  PCP:  Anita Mariscal, MD  Chief Complaint: Right ankle fracture  HPI: [name] is a 69 y.o. female who complains of right ankle pain following a ground-level fall.  She is here today for open reduction and internal fixation.  She has had moderate to severe pain.  She has been at home and compliant with nonweightbearing for the most part.  She states she does not feel safe at home.  Past Medical History:  Diagnosis Date  . Alzheimer's dementia ([place])   . Anemia   . Carpal tunnel syndrome, bilateral   . Depression   . GERD (gastroesophageal reflux disease)   . Hypercholesteremia   . Prolonged Q-T interval on ECG 06/10/2020  . Schizophrenia [facility])    Past Surgical History:  Procedure Laterality Date  . ABDOMINAL HYSTERECTOMY    . ALVEOLOPLASTY Bilateral 08/20/2020   Procedure: ALVEOLOPLASTY;  Surgeon: Diona Browner, DDS;  Location: [place];  Service: Oral Surgery;  Laterality: Bilateral;  . CARPAL TUNNEL RELEASE    . COLONOSCOPY    . MANDIBLE SURGERY    . TUMOR REMOVAL     on back   Social History   Socioeconomic History  . Marital status: Single    Spouse name: Not on file  . Number of children: Not on file  . Years of education: Not on file  . Highest education level: Not on file  Occupational History  . Not on file  Tobacco Use  . Smoking status: Never Smoker  . Smokeless tobacco: Never Used  Vaping Use  . Vaping Use: Never used  Substance and Sexual Activity  . Alcohol use: No  . Drug use: No  . Sexual activity: Not on file  Other Topics Concern  . Not on file  Social History Narrative  . Not on file   Social Determinants of Health   Financial Resource Strain:   . Difficulty of Paying Living Expenses: Not on file  Food Insecurity:   . Worried About Charity fundraiser in the Last Year: Not on file  . Ran Out of Food in the Last Year: Not on file  Transportation  Needs:   . Lack of Transportation (Medical): Not on file  . Lack of Transportation (Non-Medical): Not on file  Physical Activity:   . Days of Exercise per Week: Not on file  . Minutes of Exercise per Session: Not on file  Stress:   . Feeling of Stress : Not on file  Social Connections:   . Frequency of Communication with Friends and Family: Not on file  . Frequency of Social Gatherings with Friends and Family: Not on file  . Attends Religious Services: Not on file  . Active Member of Clubs or Organizations: Not on file  . Attends Archivist Meetings: Not on file  . Marital Status: Not on file   History reviewed. No pertinent family history. Allergies  Allergen Reactions  . Donepezil     Other reaction(s): CHEST PAINS & THRUSH,PALPITATIONS  Pt denies  . Estrogens     Other reaction(s): HRT-DVT Patient un aware   Prior to Admission medications   Medication Sig Start Date End Date Taking? Authorizing Provider  aspirin EC 81 MG tablet Take 81 mg by mouth every morning.    Yes [provider]  BIOTIN PO Take 1 tablet by mouth every morning.    Yes [provider]  buPROPion (WELLBUTRIN XL) 300 MG 24 hr tablet Take 300 mg by mouth at bedtime.    Yes [provider]  Cholecalciferol (VITAMIN D3) 50 MCG (2000 UT) TABS Take 2,000 Units by mouth daily.   Yes [provider]  citalopram (CELEXA) 40 MG tablet Take 40 mg by mouth daily.    Yes [provider]  fish oil-omega-3 fatty acids 1000 MG capsule Take 1 g by mouth daily.    Yes [provider]  gabapentin (NEURONTIN) 300 MG capsule Take 300 mg by mouth at bedtime. 05/31/20  Yes [provider]  galantamine (RAZADYNE) 12 MG tablet Take 12 mg by mouth 2 (two) times daily. 07/19/20  Yes [provider]  hydrOXYzine (VISTARIL) 50 MG capsule Take 50 mg by mouth 3 (three) times daily as needed for anxiety.  08/13/20  Yes [provider]  ibuprofen  (ADVIL) 600 MG tablet Take 1 tablet (600 mg total) by mouth every 6 (six) hours as needed. Patient taking differently: Take 400 mg by mouth every 6 (six) hours as needed for mild pain or moderate pain.  08/20/20  Yes Diona Browner, DDS  omeprazole (PRILOSEC) 40 MG capsule Take 40 mg by mouth in the morning and at bedtime.    Yes [provider]  oxyCODONE-acetaminophen (PERCOCET/ROXICET) 5-325 MG tablet Take 0.5 tablets by mouth 4 (four) times daily as needed.  09/12/20  Yes [provider]  QUEtiapine (SEROQUEL XR) 300 MG 24 hr tablet Take 600 mg by mouth at bedtime.    Yes [provider]  ramelteon (ROZEREM) 8 MG tablet Take 8 mg by mouth at bedtime. 08/14/20  Yes [provider]  simvastatin (ZOCOR) 80 MG tablet Take 80 mg by mouth at bedtime.    Yes [provider]  ondansetron (ZOFRAN ODT) 4 MG disintegrating tablet 4mg  ODT q4 hours prn nausea/vomit Patient not taking: Reported on 09/27/2020 11/18/14   [name], MD   No results found.  Positive ROS: All other systems have been reviewed and were otherwise negative with the exception of those mentioned in the HPI and as above.  Physical Exam: General: Alert, no acute distress Cardiovascular: No pedal edema Respiratory: No cyanosis, no use of accessory musculature GI: No organomegaly, abdomen is soft and non-tender Skin: No lesions in the area of chief complaint Neurologic: Sensation intact distally Psychiatric: Patient is competent for consent with normal mood and affect Lymphatic: No axillary or cervical lymphadenopathy  MUSCULOSKELETAL:  Right lower extremity:  Positive wrinkle sign.  No open wounds.  Decreased sensation in the saphenous distribution.  Otherwise neurovascularly intact.  Capillary refill less than 2 seconds.  Assessment: Supination and adduction type bimalleolar ankle fracture with marginal impaction of the medial articular cartilage.  Plan: -Ms. [name] and I  again reviewed her x-rays and discussed that this is a significant injury due to the articular cartilage damage.  This is one that is wrong with postoperative traumatic arthrosis.  Our plan is to go ahead with open reduction and internal fixation of this bimalleolar fracture.  -We will admit her postoperatively for in-house therapy and potentially for dispositional planning due to her lack of support at home.  We will follow along with therapy progress to determine where her ultimate disposition will be.  -  The risks, benefits, and alternatives were discussed with the patient. There are risks associated with the surgery including, but not limited to, problems with anesthesia (death), infection, differences in leg length/angulation/rotation, fracture of bones, loosening or  failure of implants, malunion, nonunion, hematoma (blood accumulation) which may require surgical drainage, blood clots, pulmonary embolism, nerve injury (foot drop), and blood vessel injury. The patient understands these risks and elects to proceed.     Anita Stairs, MD Cell [phone]    10/01/2020 2:05 PM

## 2020-10-01 NOTE — Anesthesia Preprocedure Evaluation (Addendum)
Anesthesia Evaluation  Patient identified by MRN, date of birth, ID band Patient awake    Reviewed: Allergy & Precautions, NPO status , Patient's Chart, lab work & pertinent test results  History of Anesthesia Complications Negative for: history of anesthetic complications  Airway Mallampati: II  TM Distance: >3 FB Neck ROM: Full   Comment: Pt c/o new left jaw pain & tenderness. No visible abscess or other abnormality. Mouth opening did not appear limited. Dental  (+) Edentulous Upper, Edentulous Lower   Pulmonary neg pulmonary ROS,    Pulmonary exam normal        Cardiovascular negative cardio ROS Normal cardiovascular exam     Neuro/Psych Depression Schizophrenia Dementia negative neurological ROS     GI/Hepatic Neg liver ROS, GERD  ,  Endo/Other  negative endocrine ROS  Renal/GU negative Renal ROS  negative genitourinary   Musculoskeletal negative musculoskeletal ROS (+)   Abdominal   Peds  Hematology negative hematology ROS (+)   Anesthesia Other Findings  HLD  Reproductive/Obstetrics                            Anesthesia Physical  Anesthesia Plan  ASA: II  Anesthesia Plan: General   Post-op Pain Management: GA combined w/ Regional for post-op pain   Induction: Intravenous  PONV Risk Score and Plan: 3 and Ondansetron, Dexamethasone, Treatment may vary due to age or medical condition and Midazolam  Airway Management Planned: LMA  Additional Equipment: None  Intra-op Plan:   Post-operative Plan: Extubation in OR  Informed Consent: I have reviewed the patients History and Physical, chart, labs and discussed the procedure including the risks, benefits and alternatives for the proposed anesthesia with the patient or authorized representative who has indicated his/her understanding and acceptance.     Dental advisory given  Plan Discussed with:   Anesthesia Plan  Comments: (Pt having jaw pain & tenderness with no obvious abscess or other abnormality. Explained that LMA may exacerbate this pain postop but we would attempt as little manipulation as possible during placement and removal. )       Anesthesia Quick Evaluation

## 2020-10-01 NOTE — Anesthesia Postprocedure Evaluation (Signed)
Anesthesia Post Note  Patient: [name]  Procedure(s) Performed: OPEN REDUCTION INTERNAL FIXATION (ORIF) ANKLE FRACTURE (Right Ankle)     Patient location during evaluation: PACU Anesthesia Type: General Level of consciousness: awake and alert Pain management: pain level controlled Vital Signs Assessment: post-procedure vital signs reviewed and stable Respiratory status: spontaneous breathing, nonlabored ventilation and respiratory function stable Cardiovascular status: blood pressure returned to baseline and stable Postop Assessment: no apparent nausea or vomiting Anesthetic complications: no   No complications documented.  Last Vitals:  Vitals:   10/01/20 1742 10/01/20 1841  BP: (!) 142/80 (!) 159/89  Pulse: 83 85  Resp: 16 20  Temp: 36.7 C 37.2 C  SpO2: 98% 96%    Last Pain:  Vitals:   10/01/20 1850  TempSrc:   PainSc: 0-No pain                 Lidia Collum

## 2020-10-01 NOTE — Op Note (Signed)
Date of Surgery: 10/01/2020  INDICATIONS: [name] is a 69 y.o.-year-old female who sustained a right SAD ankle fracture; she was indicated for open reduction and internal fixation due to the displaced nature of the articular fracture and came to the operating room today for this procedure. The patient did consent to the procedure after discussion of the risks and benefits.  PREOPERATIVE DIAGNOSIS: right bimalleolar ankle fracture (SAD type with significant articular impaction)  POSTOPERATIVE DIAGNOSIS: Same.  PROCEDURE:  1. Open treatment of right ankle fracture with internal fixation.Bimalleolar CPT [id number];  2. Application of short leg splint.  SURGEON: Andyn Sales P. Stann Mainland, M.D.  ASSIST: Jonelle Sidle, PA-C.  Assistant attestation: PA Mcclung was utilized throughout the procedure for positioning the patient, approach to the fracture, maintenance of reduction and implantation of final implants.  He was also utilized for closure and application of splint.  ANESTHESIA:  general, regional anesthetic  TOURNIQUET TIME: Under 60 minutes at 300 mmHg  IV FLUIDS AND URINE: See anesthesia.  ESTIMATED BLOOD LOSS: 20 mL.  IMPLANTS:  Arthrex medial hook plate with 3.5 mm cortical screws and one 4.0 mm cancellous screw partially threaded.  Laterally a 3.5 x 60 mm cortical screw  Biomet 5 cc bone putty  COMPLICATIONS: None.  DESCRIPTION OF PROCEDURE: The patient was brought to the operating room and placed supine on the operating table.  The patient had been signed prior to the procedure and this was documented. The patient had the anesthesia placed by the anesthesiologist.  A nonsterile tourniquet was placed on the upper thigh.  The prep verification and incision time-outs were performed to confirm that this was the correct patient, site, side and location. The patient had an SCD on the opposite lower extremity. The patient did receive antibiotics prior to the incision and was re-dosed  during the procedure as needed at indicated intervals.  The patient had the lower extremity prepped and draped in the standard surgical fashion.  The extremity was exsanguinated using an esmarch bandage and the tourniquet was inflated to 300 mm Hg.   We began the procedure with a medial incision.  Dissection was carried down through periosteum.  Care was taken to preserve the saphenous vein.  Periosteum was elevated and the fracture site was opened.  This was a significant impaction fracture to the articular surface in the anterior medial plafond.  There was pervasive marginal impaction of the cancellous bone as well.  Fluoroscopy was utilized as well as a Soil scientist to tamp the articular cartilage back to the joint surface.  There was bone loss noted at the articular surface as well.  The void was filled with Biomet DBX bone putty.  5 cc in total.  We then applied a medial hook plate.  This was positioned using fluoroscopy.  We first applied the cancellous screw perpendicular to the main fracture fragment of the medial malleolus.  This was a 50 mm x 4.0 mm cannulated screw.  Next we applied cortical screws from medial to lateral compressing the propagated fracture fragment that split and was noted on the AP fluoroscope.  3 subsequent 3.5 mm compression cortical screws were applied through the plate.  Fluoroscopy was checked on AP lateral and mortise to confirm no screws were intra-articular and compression and reduction was satisfactory.  Next, we turned our attention to the distal fibula.  This was a length stable fracture and was transverse just proximal to the joint plafond.  Given her significant swelling and soft tissue envelope and  comorbidities we elected to not open the entire fracture fragment, but selected a percutaneous fixation technique.  Utilizing AP and lateral fluoroscopy we drilled with the 2.5 mm drill bit in an intramedullary fashion from the distal fibula.  Percutaneous incision was  made over the distal fibula.  We then placed a 60 mm 3.5 mm cortical screw for adequate fixation proximal distal to the fracture fragment.  This will serve as a lateral buttress to the ankle joint.  Final x-rays were obtained AP, mortise, and lateral.  These were independently interpreted by myself.  Hardware was evaluated and noted to be acceptable.  The wounds were irrigated, and closed with vicryl with routine closure for the skin. The wounds were injected with local anesthetic. Sterile gauze was applied followed by a posterior splint. She was awakened and returned to the PACU in stable and satisfactory condition. There were no complications.  There were no noted intraoperative complications.  All counts were correct x2.  She was transported to PACU in stable condition.  POSTOPERATIVE PLAN: [name] will remain nonweightbearing on this leg for approximately 6 weeks; [name] will return for suture removal in 2 weeks.  She will be nonweightbearing in a postoperative cast following the 2-week visit.  This will be in place for 4 weeks.  At the 6-week mark she will begin weightbearing and outpatient physical therapy.  She will be admitted to the inpatient service following this surgery for evaluation by the therapist and potentially placement into a skilled nursing facility.  We have significant concerns about her safety at home as she does live alone.  Furthermore, in the postoperative course we will also obtain an jaw x-rays as she is complaining of new onset pain along the lateral mandible.  This is something that she mentioned in the preop holding room.  There was no obvious issue noted when she was intubated.   Geralynn Rile, MD [facility] [place] [phone] 4:14 PM

## 2020-10-01 NOTE — Anesthesia Procedure Notes (Signed)
Anesthesia Regional Block: Popliteal block   Pre-Anesthetic Checklist: ,, timeout performed, Correct Patient, Correct Site, Correct Laterality, Correct Procedure, Correct Position, site marked, Risks and benefits discussed,  Surgical consent,  Pre-op evaluation,  At surgeon's request and post-op pain management  Laterality: Right  Prep: chloraprep       Needles:  Injection technique: Single-shot  Needle Type: Echogenic Stimulator Needle     Needle Length: 10cm  Needle Gauge: 20     Additional Needles:   Procedures:,,,, ultrasound used (permanent image in chart),,,,  Narrative:  Start time: 10/01/2020 2:20 PM End time: 10/01/2020 2:22 PM  Performed by: Personally  Anesthesiologist: Lidia Collum, MD  Additional Notes: Standard monitors applied. Skin prepped. Good needle visualization with ultrasound. Injection made in 5cc increments with no resistance to injection. Patient tolerated the procedure well.

## 2020-10-01 NOTE — Discharge Instructions (Signed)
-  Maintain nonweightbearing status to the right lower extremity at all times.  -Maintain your postoperative splint at all times. This should remain dry and clean until your follow-up appointment in 2 weeks.  -You should also work on elevating your right lower extremity with your "toes above nose."  -Continue taking your 81 mg aspirin once you are discharged home. This will serve as prevention maintenance for blood clots.  -Moderate to severe pain use oxycodone as necessary and directed. For mild to moderate pain use Tylenol and Advil around-the-clock.  -Return to see Dr. Stann Mainland in 2 weeks for routine postoperative care.

## 2020-10-01 NOTE — Anesthesia Procedure Notes (Signed)
Procedure Name: LMA Insertion Date/Time: 10/01/2020 3:06 PM Performed by: Cynda Familia, CRNA Pre-anesthesia Checklist: Patient identified, Emergency Drugs available, Suction available and Patient being monitored Patient Re-evaluated:Patient Re-evaluated prior to induction Oxygen Delivery Method: Circle System Utilized Preoxygenation: Pre-oxygenation with 100% oxygen Induction Type: IV induction Ventilation: Mask ventilation without difficulty LMA: LMA inserted and LMA with gastric port inserted LMA Size: 4.0 Tube type: Oral Number of attempts: 1 Placement Confirmation: positive ETCO2 Tube secured with: Tape Dental Injury: Teeth and Oropharynx as per pre-operative assessment  Comments: Smooth IV induction Witman-- LMA atraumatic- no teeth as preop- bilat BS

## 2020-10-01 NOTE — Anesthesia Procedure Notes (Signed)
Anesthesia Regional Block: Adductor canal block   Pre-Anesthetic Checklist: ,, timeout performed, Correct Patient, Correct Site, Correct Laterality, Correct Procedure, Correct Position, site marked, Risks and benefits discussed,  Surgical consent,  Pre-op evaluation,  At surgeon's request and post-op pain management  Laterality: Right  Prep: chloraprep       Needles:  Injection technique: Single-shot  Needle Type: Echogenic Stimulator Needle     Needle Length: 10cm  Needle Gauge: 20     Additional Needles:   Procedures:,,,, ultrasound used (permanent image in chart),,,,  Narrative:  Start time: 10/01/2020 2:15 PM End time: 10/01/2020 2:20 PM Injection made incrementally with aspirations every 5 mL.  Performed by: Personally  Anesthesiologist: Lidia Collum, MD  Additional Notes: Standard monitors applied. Skin prepped. Good needle visualization with ultrasound. Injection made in 5cc increments with no resistance to injection. Patient tolerated the procedure well.

## 2020-10-01 NOTE — Progress Notes (Signed)
AssistedDr. Bryson Ha with right, ultrasound guided, popliteal, adductor canal block. Side rails up, monitors on throughout procedure. See vital signs in flow sheet. Tolerated Procedure well.

## 2020-10-01 NOTE — Anesthesia Procedure Notes (Signed)
Date/Time: 10/01/2020 4:37 PM Performed by: Cynda Familia, CRNA Oxygen Delivery Method: Simple face mask Placement Confirmation: positive ETCO2 and breath sounds checked- equal and bilateral Dental Injury: Teeth and Oropharynx as per pre-operative assessment

## 2020-10-02 LAB — CBC
HCT: 36.7 % (ref 36.0–46.0)
Hemoglobin: 11.9 g/dL — ABNORMAL LOW (ref 12.0–15.0)
MCH: 28.8 pg (ref 26.0–34.0)
MCHC: 32.4 g/dL (ref 30.0–36.0)
MCV: 88.9 fL (ref 80.0–100.0)
Platelets: 286 10*3/uL (ref 150–400)
RBC: 4.13 MIL/uL (ref 3.87–5.11)
RDW: 16.3 % — ABNORMAL HIGH (ref 11.5–15.5)
WBC: 9.8 10*3/uL (ref 4.0–10.5)
nRBC: 0 % (ref 0.0–0.2)

## 2020-10-02 LAB — CREATININE, SERUM
Creatinine, Ser: 0.88 mg/dL (ref 0.44–1.00)
GFR, Estimated: >60 mL/min (ref >60)

## 2020-10-02 NOTE — Progress Notes (Signed)
   Subjective:  Patient reports pain as mild.  Her block is still active so minimal pain of the right ankle. Her primary complaint his her left jaw pain worse with laying down. She is able to move her toes but reports numbness. She saw PT who moved her to a chair but they will return for an evaluation. She denies fever or chills.    Date of Surgery: 10/01/2020   PREOPERATIVE DIAGNOSIS: right bimalleolar ankle fracture (SAD type with significant articular impaction)   POSTOPERATIVE DIAGNOSIS: Same.   PROCEDURE:  1. Open treatment of right ankle fracture with internal fixation.Bimalleolar CPT [id number];  2. Application of short leg splint.   SURGEON: Jason P. Stann Mainland, M.D. Objective:   VITALS:   Vitals:   10/01/20 2200 10/02/20 0210 10/02/20 0553 10/02/20 0954  BP: 137/88 131/67 (!) 123/56 132/79  Pulse: 82 82 75 77  Resp: 16 17 17 16   Temp: 99.1 F (37.3 C) 98.4 F (36.9 C) 98.5 F (36.9 C) 98.1 F (36.7 C)  TempSrc: Oral Oral Oral   SpO2: 93% 93% 96% 96%  Weight:      Height:       Head: TTP midportion of the left jaw, mild pain with movement. No notable erythema or ulcers in the mouth.  Right Lower Extremity:  Incision: dressing C/D/I - posterior splint intact.  Decreased sensation of the toe, able to wiggle all toes.  Full knee ROM.  Capillary refill less than 2 seconds    Lab Results  Component Value Date   WBC 9.8 10/02/2020   HGB 11.9 (L) 10/02/2020   HCT 36.7 10/02/2020   MCV 88.9 10/02/2020   PLT 286 10/02/2020   BMET    Component Value Date/Time   NA 138 09/18/2020 1141   K 4.4 09/18/2020 1141   CL 107 09/18/2020 1141   CO2 23 09/18/2020 1141   GLUCOSE 119 (H) 09/18/2020 1141   BUN 18 09/18/2020 1141   CREATININE 0.88 10/02/2020 0325   CALCIUM 9.1 09/18/2020 1141   GFRNONAA >60 10/02/2020 0325   GFRAA >60 06/09/2020 1158     Assessment/Plan: 1 Day Post-Op   Active Problems:   Ankle fracture, bimalleolar, closed, right, initial  encounter   Advance diet Up with therapy   Nerve block still active.   She will stay at least one more night. Patient currently awaiting further evaluation by PT. May recommend SNF placement following evaluation with discharge in 1-2 days. She has an order for a knee scooter to be picked up. Hemoglobin stable, no need for transfusion.  Recommend watchful waiting for jaw pain,pain management,  x-rays negative. May need ENT referral in the future.    Ms. Oddo will remain nonweightbearing on this leg for approximately 6 weeks; Ms. Oki will return for suture removal in 2 weeks at [facility] with me or Dr. Stann Mainland.  She will be nonweightbearing in a postoperative cast following the 2-week visit.  This will be in place for 4 weeks.  At the 6-week mark she will begin weightbearing and outpatient physical therapy.  Weightbearing Status: NWB. Maintained splint, keep dry and clean.   DVT Prophylaxis: Lovenox while inpatient, Aspirin 81mg  BID outpatient for 6 weeks.    Faythe Casa 10/02/2020, 11:33 AM  Jonelle Sidle PA-C  Physician Assistant with Dr. Lillia Abed [place]

## 2020-10-02 NOTE — Evaluation (Signed)
Physical Therapy Evaluation Patient Details Name: [name] MRN: [id number] DOB: 11-Aug-1951 Today's Date: 10/02/2020   History of Present Illness  Pt is a 69 y/o female s/p R ankle ORIF per Dr Stann Mainland on 10/01/2020 PMH includes dementia and schizophrenia.  Clinical Impression  Pt admitted with above diagnosis.  Pt reports she has been having incr difficulty managing at home since ankle fx, son has been assisting. Today she is able to pivot to recliner however has much difficulty maintaining NWB RLE.  Recommend SNF post acute. Will continue to follow   Pt currently with functional limitations due to the deficits listed below (see PT Problem List). Pt will benefit from skilled PT to increase their independence and safety with mobility to allow discharge to the venue listed below.       Follow Up Recommendations SNF;Supervision/Assistance - 24 hour    Equipment Recommendations  None recommended by PT    Recommendations for Other Services       Precautions / Restrictions Precautions Precautions: Fall Restrictions RLE Weight Bearing: Non weight bearing      Mobility  Bed Mobility Overal bed mobility: Needs Assistance Bed Mobility: Supine to Sit     Supine to sit: Min guard     General bed mobility comments: for safety    Transfers Overall transfer level: Needs assistance Equipment used: Rolling walker (2 wheeled) Transfers: Sit to/from [employer] Sit to Stand: Min assist   Squat pivot transfers: Min assist     General transfer comment: assist to rise and transition to RW, multi-modal cues for NWB, hand placement and overall safety. pt able to maintain NWB RLE for squat pivot with multi-modal cues, min assist for balance and safety  Ambulation/Gait             General Gait Details: unable to maintain NWB, deferred  Stairs            Wheelchair Mobility    Modified Rankin (Stroke Patients Only)       Balance Overall balance  assessment: Needs assistance Sitting-balance support: No upper extremity supported;Feet supported Sitting balance-Leahy Scale: Good     Standing balance support: Bilateral upper extremity supported;During functional activity Standing balance-Leahy Scale: Poor Standing balance comment: Reliant on BUE support                              Pertinent Vitals/Pain Pain Assessment: No/denies pain Pain Intervention(s): Monitored during session;Repositioned;Other (comment)    Home Living Family/patient expects to be discharged to:: Unsure Living Arrangements: Children   Type of Home: House Home Access: Stairs to enter   Entrance Stairs-Number of Steps: 2   Home Equipment: [place] - 2 wheels;Bedside commode;Wheelchair - manual Additional Comments: son works however has taken time off to care for his mother since ankle fx    Prior Function Level of Independence: Independent         Comments: prior to fall     Hand Dominance        Extremity/Trunk Assessment   Upper Extremity Assessment Upper Extremity Assessment: Defer to OT evaluation    Lower Extremity Assessment Lower Extremity Assessment: RLE deficits/detail;LLE deficits/detail RLE Deficits / Details: R Ankle in splint. decr sensation d/t residual effects of anesthesia. LLE Deficits / Details: grossly WFL       Communication   Communication: No difficulties  Cognition Arousal/Alertness: Awake/alert Behavior During Therapy: WFL for tasks assessed/performed Overall Cognitive Status: History of cognitive  impairments - at baseline Area of Impairment: Following commands;Problem solving                       Following Commands: Follows one step commands with increased time;Follows multi-step commands inconsistently     Problem Solving: Difficulty sequencing;Requires verbal cues;Requires tactile cues General Comments: hx of dementia      General Comments      Exercises     Assessment/Plan     PT Assessment Patient needs continued PT services  PT Problem List Decreased strength;Decreased balance;Decreased mobility;Decreased activity tolerance;Decreased knowledge of use of DME;Decreased safety awareness;Decreased knowledge of precautions;Decreased cognition;Pain       PT Treatment Interventions DME instruction;Functional mobility training;Gait training;Therapeutic activities;Therapeutic exercise;Balance training;Wheelchair mobility training;Patient/family education    PT Goals (Current goals can be found in the Care Plan section)  Acute Rehab PT Goals Patient Stated Goal: at this time pt agreeable to rehab PT Goal Formulation: With patient Time For Goal Achievement: 10/16/20 Potential to Achieve Goals: Good    Frequency Min 3X/week   Barriers to discharge        Co-evaluation               AM-PAC PT "6 Clicks" Mobility  Outcome Measure Help needed turning from your back to your side while in a flat bed without using bedrails?: A Little Help needed moving from lying on your back to sitting on the side of a flat bed without using bedrails?: A Little Help needed moving to and from a bed to a chair (including a wheelchair)?: A Lot Help needed standing up from a chair using your arms (e.g., wheelchair or bedside chair)?: A Little Help needed to walk in hospital room?: Total Help needed climbing 3-5 steps with a railing? : Total 6 Click Score: 13    End of Session Equipment Utilized During Treatment: Gait belt Activity Tolerance: Patient tolerated treatment well Patient left: in chair;with call bell/phone within reach;with chair alarm set Nurse Communication: Mobility status PT Visit Diagnosis: Unsteadiness on feet (R26.81);Muscle weakness (generalized) (M62.81);History of falling (Z91.81);Difficulty in walking, not elsewhere classified (R26.2)    Time: 0388-8280 PT Time Calculation (min) (ACUTE ONLY): 15 min   Charges:   PT Evaluation $PT Eval Low  Complexity: [place], PT  Acute Rehab Dept ([place]) [phone] Pager [phone]  10/02/2020   [facility] 10/02/2020, 11:54 AM

## 2020-10-02 NOTE — TOC Initial Note (Signed)
Transition of Care [facility]) - Initial/Assessment Note    Patient Details  Name: [name] MRN: [id number] Date of Birth: 11-03-1951  Transition of Care [facility]) CM/SW Contact:    Lia Hopping, [place] Phone Number:  10/02/2020, 1:56 PM  Clinical Narrative: Re: SNF placement    Patient admitted for right bimallerleolar ankle           CSW met with the patient and her son at bedside to discuss rehab placement. CSW explain SNF process and will follow up with bed offers. Son is concern about the patient going to rehab and "sitting and being left alone." CSW explain the SNF should complete therapy with the patient in anticipation for her to return home.   FL2 completed.  PASRR completed LevelI received. [id number] E  [employer] navi pending reference# [id number]  4:38PM CSW reached out to the patient son with SNF offers. Patient son is undecided about the patient going to SNF for rehab.  He has concerns about the patient wellbeing in a facility. He reports he has taken off work to provide care for her at home for the next two months. He will also have support from other family members. He wants to discuss this with Dr. Stann Mainland. CSW notified the nurse staff.   TOC staff will follow for SNF vs. Home w/H. Health     Expected Discharge Plan: Skilled Nursing Facility Barriers to Discharge: Continued Medical Work up, Insurance Authorization   Patient Goals and CMS Choice     Choice offered to / list presented to : Adult Children  Expected Discharge Plan and Services Expected Discharge Plan: [place] In-house Referral: Clinical Social Work Discharge Planning Services: CM Consult Post Acute Care Choice: [place] Living arrangements for the past 2 months: [place]                                      Prior Living Arrangements/Services Living arrangements for the past 2 months: Single Family Home Lives with:: Adult Children Patient language  and need for interpreter reviewed:: No Do you feel safe going back to the place where you live?: Yes      Need for Family Participation in Patient Care: Yes (Comment) Care giver support system in place?: Yes (comment) Current home services: DME Criminal Activity/Legal Involvement Pertinent to Current Situation/Hospitalization: No - Comment as needed  Activities of Daily Living Home Assistive Devices/Equipment: Eyeglasses, Bedside commode/3-in-1, Wheelchair ADL Screening (condition at time of admission) Patient's cognitive ability adequate to safely complete daily activities?: Yes Is the patient deaf or have difficulty hearing?: No Does the patient have difficulty seeing, even when wearing glasses/contacts?: No Does the patient have difficulty concentrating, remembering, or making decisions?: No Patient able to express need for assistance with ADLs?: Yes Does the patient have difficulty dressing or bathing?: No Independently performs ADLs?: Yes (appropriate for developmental age) Does the patient have difficulty walking or climbing stairs?: No Weakness of Legs: None Weakness of Arms/Hands: None  Permission Sought/Granted Permission sought to share information with : Case Manager, Family Supports Permission granted to share information with : Yes, Verbal Permission Granted  Share Information with NAME: Thompson,Allen  Permission granted to share info w AGENCY: SNF's in the area  Permission granted to share info w Relationship: Son  Permission granted to share info w Contact Information: [phone]  Emotional Assessment Appearance:: Appears stated age Attitude/Demeanor/Rapport: Engaged Affect (typically  observed): Calm Orientation: : Oriented to Self, Oriented to Place, Oriented to Situation, Oriented to  Time Alcohol / Substance Use: Not Applicable Psych Involvement: No (comment)  Admission diagnosis:  Ankle fracture, bimalleolar, closed, right, initial encounter  [E43.539N] Patient Active Problem List   Diagnosis Date Noted   Ankle fracture, bimalleolar, closed, right, initial encounter 10/01/2020   PCP:  Sandi Mariscal, MD Pharmacy:   [facility] [place], [address] [place] [address] Phone: [phone] Fax: [phone]     Social Determinants of Health (SDOH) Interventions    Readmission Risk Interventions No flowsheet data found.

## 2020-10-02 NOTE — Progress Notes (Addendum)
Transition of Care (TOC) -30 day Note       Patient Details  Name: [name] MRN: [id number] Date of Birth: [birth year]   Transition of Care [facility]) CM/SW Contact  Name: Kathrin Greathouse Phone [phone] Date: 17-Sep-1951 Time: 1:47PM   MUST ID:    To Whom it May Concern:   Please be advised that the above patient will require a short-term nursing home stay, anticipated 30 days or less rehabilitation and strengthening. The plan is for return home.

## 2020-10-02 NOTE — NC FL2 (Addendum)
[place] LEVEL OF CARE SCREENING TOOL     IDENTIFICATION  Patient Name: [name] Birthdate: Sep 20, 1951 Sex: female Admission Date (Current Location): 10/01/2020  [facility] and [place] Number:  Herbalist and Address:  [facility],  [place], [place]      Provider Number: [id number]  Attending Physician Name and Address:  Nicholes Stairs, MD  Relative Name and Phone Number:  Tawny Hopping [phone]    Current Level of Care: Hospital Recommended Level of Care: [place] Prior Approval Number:    Date Approved/Denied:   PASRR Number: Pending  Discharge Plan: SNF    Current Diagnoses: Patient Active Problem List   Diagnosis Date Noted  . Ankle fracture, bimalleolar, closed, right, initial encounter 10/01/2020    Orientation RESPIRATION BLADDER Height & Weight     Self, Time, Situation, Place  Normal Continent Weight: 200 lb (90.7 kg) Height:  5\' 5"  (165.1 cm)  BEHAVIORAL SYMPTOMS/MOOD NEUROLOGICAL BOWEL NUTRITION STATUS      Continent Diet (Regular)  AMBULATORY STATUS COMMUNICATION OF NEEDS Skin   Extensive Assist Verbally Surgical wounds (Right Ankle)                       Personal Care Assistance Level of Assistance  Bathing, Feeding, Dressing Bathing Assistance: Maximum assistance Feeding assistance: Independent Dressing Assistance: Maximum assistance     Functional Limitations Info  Sight, Hearing, Speech Sight Info: Impaired Hearing Info: Adequate Speech Info: Adequate    SPECIAL CARE FACTORS FREQUENCY  PT (By licensed PT), OT (By licensed OT)     PT Frequency: 5x/week OT Frequency: 5x/week            Contractures Contractures Info: Not present    Additional Factors Info  Code Status, Allergies, Psychotropic Code Status Info: Fullcode Allergies Info: Allergies: Donepezil, Estrogens Psychotropic Info: Wellbutrin, Celexa, Seroquel          Current Medications (10/02/2020):  This is the current hospital active medication list Current Facility-Administered Medications  Medication Dose Route Frequency Provider Last Rate Last Admin  . acetaminophen (TYLENOL) tablet 325-650 mg  325-650 mg Oral Q6H PRN Nicholes Stairs, MD      . aspirin EC tablet 81 mg  81 mg Oral Oswaldo Milian, MD   81 mg at 10/02/20 0610  . atorvastatin (LIPITOR) tablet 40 mg  40 mg Oral Daily Nicholes Stairs, MD   40 mg at 10/02/20 9449  . buPROPion (WELLBUTRIN XL) 24 hr tablet 300 mg  300 mg Oral QHS Nicholes Stairs, MD   300 mg at 10/01/20 2155  . cholecalciferol (VITAMIN D3) tablet 2,000 Units  2,000 Units Oral Daily Nicholes Stairs, MD   2,000 Units at 10/02/20 [phone]  . citalopram (CELEXA) tablet 40 mg  40 mg Oral Daily Nicholes Stairs, MD   40 mg at 10/02/20 1638  . enoxaparin (LOVENOX) injection 40 mg  40 mg Subcutaneous Q24H Nicholes Stairs, MD   40 mg at 10/02/20 0610  . gabapentin (NEURONTIN) capsule 300 mg  300 mg Oral QHS Nicholes Stairs, MD   300 mg at 10/01/20 2155  . galantamine (RAZADYNE) tablet 12 mg  12 mg Oral BID Nicholes Stairs, MD   12 mg at 10/02/20 4665  . HYDROcodone-acetaminophen (NORCO) 7.5-325 MG per tablet 1-2 tablet  1-2 tablet Oral Q4H PRN Nicholes Stairs, MD      . HYDROcodone-acetaminophen (NORCO/VICODIN)  5-325 MG per tablet 1-2 tablet  1-2 tablet Oral Q4H PRN Nicholes Stairs, MD   2 tablet at 10/02/20 9562  . hydrOXYzine (ATARAX/VISTARIL) tablet 50 mg  50 mg Oral TID PRN Nicholes Stairs, MD      . morphine 2 MG/ML injection 0.5-1 mg  0.5-1 mg Intravenous Q2H PRN Nicholes Stairs, MD      . mupirocin ointment Drue Stager) 2 %   Nasal BID Nicholes Stairs, MD   1 application at 13/08/65 [phone]  . naproxen (NAPROSYN) tablet 250 mg  250 mg Oral BID WC Nicholes Stairs, MD   250 mg at 10/02/20 9629  . omega-3 acid ethyl esters (LOVAZA) capsule 1 g   1 g Oral Daily Nicholes Stairs, MD   1 g at 10/02/20 5284  . ondansetron (ZOFRAN) tablet 4 mg  4 mg Oral Q6H PRN Nicholes Stairs, MD       Or  . ondansetron [facility]) injection 4 mg  4 mg Intravenous Q6H PRN Nicholes Stairs, MD      . pantoprazole (PROTONIX) EC tablet 40 mg  40 mg Oral Daily Nicholes Stairs, MD   40 mg at 10/02/20 1324  . QUEtiapine (SEROQUEL XR) 24 hr tablet 600 mg  600 mg Oral QHS Nicholes Stairs, MD   600 mg at 10/01/20 2155  . ramelteon (ROZEREM) tablet 8 mg  8 mg Oral QHS Nicholes Stairs, MD   8 mg at 10/01/20 2155  . senna (SENOKOT) tablet 8.6 mg  1 tablet Oral BID Nicholes Stairs, MD   8.6 mg at 10/02/20 4010     Discharge Medications: Please see discharge summary for a list of discharge medications.  Relevant Imaging Results:  Relevant Lab Results:   Additional Information ssn: [id number]  Lia Hopping, LCSW

## 2020-10-03 NOTE — Progress Notes (Signed)
[name]  MRN: [id number] DOB/Age: 12/01/1950 69 y.o. [facility] Procedure: Procedure(s) (LRB): OPEN REDUCTION INTERNAL FIXATION (ORIF) ANKLE FRACTURE (Right)     Subjective: Awake and alert with minimal complaints  Vital Signs Temp:  [97.6 F (36.4 C)-98.6 F (37 C)] 97.6 F (36.4 C) (11/25 0532) Pulse Rate:  [67-78] 69 (11/25 0532) Resp:  [15-18] 16 (11/25 0532) BP: (119-134)/(68-79) 119/70 (11/25 0532) SpO2:  [95 %-99 %] 95 % (11/25 0532)  Lab Results Recent Labs    10/02/20 0325  WBC 9.8  HGB 11.9*  HCT 36.7  PLT 286   BMET Recent Labs    10/02/20 0325  CREATININE 0.88   INR  Date Value Ref Range Status  09/18/2020 0.9 0.8 - 1.2 Final    Comment:    (NOTE) INR goal varies based on device and disease states. Performed at [facility], [place] [address] [place], [place] [address]      Exam NVI to toes Splint in good condition        Plan I called and spoke with the son regarding disposition issues.  I reviewed the social worker notes yesterday and physical therapy notes.  His desire is to take her home.  He is able to provide her safe environment and assistance and it looks as if she did well maintaining nonweightbearing yesterday.  We will make arrangements for discharge tomorrow into the care of her son.  He is going to arrange transportation for her to home.  She will continue current care today and plan for discharge tomorrow  [name] PA-C  10/03/2020, 8:25 AM Contact # [phone]

## 2020-10-04 NOTE — TOC Transition Note (Signed)
Transition of Care [facility]) - CM/SW Discharge Note   Patient Details  Name: [name] MRN: [id number] Date of Birth: 1950-12-29  Transition of Care [facility]) CM/SW Contact:  Lia Hopping, [place] Phone Number: 10/04/2020, 10:48 AM   Clinical Narrative:    CSW canceled SNF authorization. Patient to d/c home w/ home health in the care of her son.  CSW confirmed [facility] will provide PT services. Son notified start of care is Monday or Tuesday of next week. He report understanding.  No other needs identified.    Final next level of care: [place] Barriers to Discharge: Barriers Resolved   Patient Goals and CMS Choice Patient states their goals for this hospitalization and ongoing recovery are:: return home CMS Medicare.gov Compare Post Acute Care list provided to:: Patient Choice offered to / list presented to : Patient, Adult Children  Discharge Placement                       Discharge Plan and Services In-house Referral: Clinical Social Work Discharge Planning Services: CM Consult Post Acute Care Choice: [place]: PT [place]: [place] Date [place]: 10/04/20 Time [place]: [phone] Representative spoke with at [place]: [place]  Social Determinants of Health ([place]) Interventions     Readmission Risk Interventions No flowsheet data found.

## 2020-10-04 NOTE — Plan of Care (Signed)
Patient discharged home in stable condition 

## 2020-10-04 NOTE — Progress Notes (Signed)
Subjective: 3 Days Post-Op Procedure(s) (LRB): OPEN REDUCTION INTERNAL FIXATION (ORIF) ANKLE FRACTURE (Right)  Patient reports pain as mild to moderate.  Reports that she is ready to be D/C'd home under the care of her son.  Denies fever, chills, N/V, CP, SOB.  Tolerating POs well.  Admits to flatus.  Objective:   VITALS:  Temp:  [97.5 F (36.4 C)-98.2 F (36.8 C)] 97.5 F (36.4 C) (11/26 0501) Pulse Rate:  [69-75] 71 (11/26 0501) Resp:  [16-17] 16 (11/26 0501) BP: (100-142)/(72-82) 141/81 (11/26 0501) SpO2:  [97 %-100 %] 100 % (11/26 0501)  General: WDWN patient in NAD. Psych:  Appropriate mood and affect. Neuro:  A&O x 3, Moving all extremities, sensation intact to light touch HEENT:  EOMs intact Chest:  Even non-labored respirations Skin:  SLS C/D/I, no rashes or lesions Extremities: warm/dry, no visible edema, erythema or echymosis.  No lymphadenopathy. Pulses: [place]opliteus 2+ MSK:  ROM: EHL/FHL intact, MMT: able to perform quad set    LABS Recent Labs    10/02/20 0325  HGB 11.9*  WBC 9.8  PLT 286   Recent Labs    10/02/20 0325  CREATININE 0.88   No results for input(s): LABPT, INR in the last 72 hours.   Assessment/Plan: 3 Days Post-Op Procedure(s) (LRB): OPEN REDUCTION INTERNAL FIXATION (ORIF) ANKLE FRACTURE (Right)  Patient seen in rounds for Dr. Stann Mainland NWB R LE Up with therapy D/C home today under care of her son. Plan for 2 week outpatient post-op visit with Dr. Stann Mainland.  Mechele Claude PA-C [facility] Office:  [phone]

## 2020-10-04 NOTE — Discharge Summary (Signed)
Physician Discharge Summary  Patient ID: [name] MRN: [id number] DOB/AGE: 04-13-1951 69 y.o.  Admit date: 10/01/2020 Discharge date: 10/04/2020  Admission Diagnoses: R ankle bimalleolar fx; hx of alzheimer's dementia, anemia, carpal tunnel, depression, GERD, hypercholesteremia, prolonged Q-T interval, schizophrenia  Discharge Diagnoses:  Active Problems:   Ankle fracture, bimalleolar, closed, right, initial encounter Same as above  Discharged Condition: stable  Hospital Course: Patient presented to [facility] on 10/01/20 after sustaining a ground level fall and complaining of R ankle pain.  Plain films were obtained and the patient was diagnosed with a R ankle bimalleolar fx.  Dr. Victorino December was consulted.  The patient was taken to the OR for elective ORIF of R ankle bimalleolar fx by Dr. Stann Mainland.  The patient tolerated the procedure well without complication.  She was then admitted to the hospital.  She worked well with therapy.  She tolerated her stay well without complication.  She is to be D/C'd home, under the care of her son, on 10/04/20.  Consults: PT, case management  Significant Diagnostic Studies: radiology: X-Ray: For diagnostic purposes and to ensure satisfactory anatomic alignement during operative procedure.  Treatments: IV hydration, antibiotics: Ancef, analgesia: acetaminophen, Vicodin, Morphine and norco, anticoagulation: lovenox and surgery: as stated above.  Discharge Exam: Blood pressure (!) 141/81, pulse 71, temperature (!) 97.5 F (36.4 C), temperature source Oral, resp. rate 16, height 5\' 5"  (1.651 m), weight 90.7 kg, SpO2 100 %. General: WDWN patient in NAD. Psych:  Appropriate mood and affect. Neuro:  A&O x 3, Moving all extremities, sensation intact to light touch HEENT:  EOMs intact Chest:  Even non-labored respirations Skin:  SLS C/D/I, no rashes or lesions Extremities: warm/dry, no visible edema, erythema or echymosis.  No  lymphadenopathy. Pulses: Popliteus 2+ MSK:  ROM: EHL/FHL intact, MMT: able to perform quad set   Disposition: Discharge disposition: 01-Home or Self Care       Discharge Instructions    Call MD / Call 911   Complete by: As directed    If you experience chest pain or shortness of breath, CALL 911 and be transported to the hospital emergency room.  If you develope a fever above 101 F, pus (white drainage) or increased drainage or redness at the wound, or calf pain, call your surgeon's office.   Constipation Prevention   Complete by: As directed    Drink plenty of fluids.  Prune juice may be helpful.  You may use a stool softener, such as Colace (over the counter) 100 mg twice a day.  Use MiraLax (over the counter) for constipation as needed.   Diet - low sodium heart healthy   Complete by: As directed    Increase activity slowly as tolerated   Complete by: As directed    Non weight bearing   Complete by: As directed    Laterality: right   Extremity: Lower     Allergies as of 10/04/2020      Reactions   Donepezil    Other reaction(s): CHEST PAINS & THRUSH,PALPITATIONS Pt denies   Estrogens    Other reaction(s): HRT-DVT Patient un aware      Medication List    TAKE these medications   aspirin EC 81 MG tablet Take 81 mg by mouth every morning.   BIOTIN PO Take 1 tablet by mouth every morning.   buPROPion 300 MG 24 hr tablet Commonly known as: WELLBUTRIN XL Take 300 mg by mouth at bedtime.   citalopram 40 MG tablet  Commonly known as: CELEXA Take 40 mg by mouth daily.   fish oil-omega-3 fatty acids 1000 MG capsule Take 1 g by mouth daily.   gabapentin 300 MG capsule Commonly known as: NEURONTIN Take 300 mg by mouth at bedtime.   galantamine 12 MG tablet Commonly known as: RAZADYNE Take 12 mg by mouth 2 (two) times daily.   hydrOXYzine 50 MG capsule Commonly known as: VISTARIL Take 50 mg by mouth 3 (three) times daily as needed for anxiety.   ibuprofen  600 MG tablet Commonly known as: ADVIL Take 1 tablet (600 mg total) by mouth every 6 (six) hours as needed. What changed:   how much to take  reasons to take this   methocarbamol 500 MG tablet Commonly known as: Robaxin Take 1 tablet (500 mg total) by mouth every 6 (six) hours as needed for muscle spasms.   omeprazole 40 MG capsule Commonly known as: PRILOSEC Take 40 mg by mouth in the morning and at bedtime.   ondansetron 4 MG disintegrating tablet Commonly known as: Zofran ODT 4mg  ODT q4 hours prn nausea/vomit   oxyCODONE 5 MG immediate release tablet Commonly known as: Roxicodone Take 1 tablet (5 mg total) by mouth every 4 (four) hours as needed for up to 7 days for severe pain.   oxyCODONE-acetaminophen 5-325 MG tablet Commonly known as: PERCOCET/ROXICET Take 0.5 tablets by mouth 4 (four) times daily as needed.   QUEtiapine 300 MG 24 hr tablet Commonly known as: SEROQUEL XR Take 600 mg by mouth at bedtime.   ramelteon 8 MG tablet Commonly known as: ROZEREM Take 8 mg by mouth at bedtime.   simvastatin 80 MG tablet Commonly known as: ZOCOR Take 80 mg by mouth at bedtime.   Vitamin D3 50 MCG (2000 UT) Tabs Take 2,000 Units by mouth daily.            Discharge Care Instructions  (From admission, onward)         Start     Ordered   10/04/20 0000  Non weight bearing       Question Answer Comment  Laterality right   Extremity Lower      10/04/20 0911          Follow-up Information    Nicholes Stairs, MD In 2 weeks.   Specialty: Orthopedic Surgery Why: For suture removal, For wound re-check Contact information: [address] [place] [address] [phone]               Signed: Mohammed Kindle Office:  [phone]

## 2020-10-07 ENCOUNTER — Encounter (HOSPITAL_COMMUNITY): Payer: Self-pay | Admitting: Orthopedic Surgery

## 2020-10-28 ENCOUNTER — Other Ambulatory Visit: Payer: Self-pay | Admitting: Oral Surgery

## 2020-10-28 DIAGNOSIS — L0201 Cutaneous abscess of face: Secondary | ICD-10-CM

## 2020-10-29 ENCOUNTER — Other Ambulatory Visit (HOSPITAL_COMMUNITY): Payer: Self-pay | Admitting: Oral Surgery

## 2020-10-29 ENCOUNTER — Encounter (HOSPITAL_COMMUNITY): Payer: Self-pay | Admitting: Oral Surgery

## 2020-10-29 DIAGNOSIS — L0201 Cutaneous abscess of face: Secondary | ICD-10-CM

## 2020-10-29 NOTE — H&P (Signed)
HISTORY AND PHYSICAL  [name] is a 69 y.o. female patient.  CC: Jaw pain, swelling.  HPI: Pt underwent mandibular alveoloplasty and lingual tori removal at [facility] on 08/20/2020. Normal post operative course until pt began to have pain and swelling approximately 1 week ago.   No diagnosis found.  Past Medical History:  Diagnosis Date  . Alzheimer's dementia ([place])   . Anemia   . Carpal tunnel syndrome, bilateral   . Depression   . GERD (gastroesophageal reflux disease)   . Hypercholesteremia   . Prolonged Q-T interval on ECG 06/10/2020  . Schizophrenia ([place])     No current facility-administered medications for this encounter.   Current Outpatient Medications  Medication Sig Dispense Refill  . aspirin EC 81 MG tablet Take 81 mg by mouth every morning.    Marland Kitchen BIOTIN PO Take 1 tablet by mouth every morning.    Marland Kitchen buPROPion (WELLBUTRIN XL) 300 MG 24 hr tablet Take 300 mg by mouth at bedtime.    . Cholecalciferol (VITAMIN D3) 50 MCG (2000 UT) TABS Take 2,000 Units by mouth daily.    . citalopram (CELEXA) 40 MG tablet Take 40 mg by mouth daily.    . fish oil-omega-3 fatty acids 1000 MG capsule Take 1,000 mg by mouth daily.    Marland Kitchen gabapentin (NEURONTIN) 300 MG capsule Take 300 mg by mouth at bedtime.    Marland Kitchen galantamine (RAZADYNE) 12 MG tablet Take 12 mg by mouth 2 (two) times daily.    . hydrOXYzine (VISTARIL) 50 MG capsule Take 50 mg by mouth 3 (three) times daily as needed for anxiety.     Marland Kitchen ibuprofen (ADVIL) 600 MG tablet Take 1 tablet (600 mg total) by mouth every 6 (six) hours as needed. (Patient taking differently: Take 400 mg by mouth every 6 (six) hours as needed for mild pain or moderate pain.) 20 tablet 1  . methocarbamol (ROBAXIN) 500 MG tablet Take 1 tablet (500 mg total) by mouth every 6 (six) hours as needed for muscle spasms. 45 tablet 1  . omeprazole (PRILOSEC) 40 MG capsule Take 40 mg by mouth in the morning and at bedtime.    Marland Kitchen oxyCODONE-acetaminophen  (PERCOCET/ROXICET) 5-325 MG tablet Take 0.5 tablets by mouth 4 (four) times daily as needed for severe pain.    Marland Kitchen QUEtiapine (SEROQUEL XR) 300 MG 24 hr tablet Take 600 mg by mouth at bedtime.    . ramelteon (ROZEREM) 8 MG tablet Take 8 mg by mouth at bedtime.    . simvastatin (ZOCOR) 80 MG tablet Take 80 mg by mouth at bedtime.    Marland Kitchen tiZANidine (ZANAFLEX) 4 MG tablet Take 4 mg by mouth 3 (three) times daily as needed for muscle spasms.    . ondansetron (ZOFRAN ODT) 4 MG disintegrating tablet 4mg  ODT q4 hours prn nausea/vomit (Patient not taking: No sig reported) 6 tablet 0   Allergies  Allergen Reactions  . Donepezil     Other reaction(s): CHEST PAINS & THRUSH,PALPITATIONS  Pt denies  . Estrogens     Other reaction(s): HRT-DVT Patient un aware   Active Problems:   * No active hospital problems. *  Vitals: There were no vitals taken for this visit. Lab results:No results found for this or any previous visit (from the past 23 hour(s)). Radiology Results: No results found. General appearance: alert, cooperative and no distress Head: Normocephalic, without obvious abnormality, atraumatic Eyes: negative Nose: Nares normal. Septum midline. Mucosa normal. No drainage or sinus tenderness. Throat: Edentulous  maxilla and mandible. Buccal and Lingual edema anterior mandible. Pustules present left mandible mid-body lingual. No exposed bone. No trismus. Pharynx clear.  Neck: supple, symmetrical, trachea midline and Pointing submental edema and fluctuance with erythema. Resp: clear to auscultation bilaterally Cardio: regular rate and rhythm, S1, S2 normal, no murmur, click, rub or gallop   CT ordered for 10/30/2020.  Assessment: Sublingual/submental space abscess mandible. Possible osteomyelitis. CT pending.  Plan: Incision and drainage mandible. GA. Day surgery.   Diona Browner 10/29/2020

## 2020-10-29 NOTE — Anesthesia Preprocedure Evaluation (Addendum)
Anesthesia Evaluation  Patient identified by MRN, date of birth, ID band Patient awake    Reviewed: Allergy & Precautions, NPO status , Patient's Chart, lab work & pertinent test results, Unable to perform ROS - Chart review only  Airway Mallampati: I  TM Distance: >3 FB Neck ROM: Full    Dental   Pulmonary neg pulmonary ROS,    Pulmonary exam normal        Cardiovascular negative cardio ROS Normal cardiovascular exam     Neuro/Psych Depression Schizophrenia Dementia negative neurological ROS     GI/Hepatic negative GI ROS, Neg liver ROS,   Endo/Other  negative endocrine ROS  Renal/GU negative Renal ROS     Musculoskeletal negative musculoskeletal ROS (+)   Abdominal   Peds  Hematology negative hematology ROS (+)   Anesthesia Other Findings   Reproductive/Obstetrics                                                             Anesthesia Evaluation  Patient identified by MRN, date of birth, ID band Patient awake    Reviewed: Allergy & Precautions, NPO status , Patient's Chart, lab work & pertinent test results  History of Anesthesia Complications Negative for: history of anesthetic complications  Airway Mallampati: II  TM Distance: >3 FB Neck ROM: Full    Dental  (+) Edentulous Upper, Edentulous Lower   Pulmonary neg pulmonary ROS,    Pulmonary exam normal        Cardiovascular negative cardio ROS Normal cardiovascular exam     Neuro/Psych Depression Schizophrenia Dementia negative neurological ROS     GI/Hepatic negative GI ROS, Neg liver ROS,   Endo/Other  negative endocrine ROS  Renal/GU negative Renal ROS  negative genitourinary   Musculoskeletal negative musculoskeletal ROS (+)   Abdominal   Peds  Hematology negative hematology ROS (+)   Anesthesia Other Findings  HLD  Reproductive/Obstetrics                             Anesthesia Physical Anesthesia Plan  ASA: II  Anesthesia Plan: General   Post-op Pain Management:    Induction: Intravenous  PONV Risk Score and Plan: 3 and Ondansetron, Dexamethasone, Treatment may vary due to age or medical condition and Midazolam  Airway Management Planned: Nasal ETT  Additional Equipment: None  Intra-op Plan:   Post-operative Plan: Extubation in OR  Informed Consent: I have reviewed the patients History and Physical, chart, labs and discussed the procedure including the risks, benefits and alternatives for the proposed anesthesia with the patient or authorized representative who has indicated his/her understanding and acceptance.     Dental advisory given  Plan Discussed with:   Anesthesia Plan Comments:        Anesthesia Quick Evaluation  Anesthesia Physical Anesthesia Plan  ASA: III  Anesthesia Plan: General   Post-op Pain Management:    Induction: Intravenous  PONV Risk Score and Plan: 4 or greater and Ondansetron, Dexamethasone and Treatment may vary due to age or medical condition  Airway Management Planned: Nasal ETT  Additional Equipment: None  Intra-op Plan:   Post-operative Plan: Extubation in OR  Informed Consent: I have reviewed the patients History and Physical, chart, labs and discussed the procedure including the  risks, benefits and alternatives for the proposed anesthesia with the patient or authorized representative who has indicated his/her understanding and acceptance.       Plan Discussed with: CRNA and Surgeon  Anesthesia Plan Comments: (PAT note written 10/29/2020 by Myra Gianotti, PA-C.  Patient is scheduled for [facility] at [facility] at 10:30 AM on the morning of surgery. She is for same day COVID-19 testing due to transportation/mobilitly issues given her recent ankle fracture.  )      Anesthesia Quick Evaluation

## 2020-10-29 NOTE — Progress Notes (Signed)
Denies chest pain, shortness of breath, or cardiology visit. Son states he cannot get patient to COVID test due to ankle fracture s/p surgery. Denies COVID exposures for patient. Son reports increasing difficulty in caring for patient. Left message for Camilee in Case Management and requested they come and provide patient/ son resources tomorrow.

## 2020-10-29 NOTE — Progress Notes (Signed)
Anesthesia Chart Review: Kathleene Hazel   Case: [id number] Date/Time: 10/30/20 1315   Procedure: INCISION AND DRAINAGE ABSCESS (N/A )   Anesthesia type: General   Pre-op diagnosis: NONRESTORABLE   Location: [facility] OR ROOM 11 / [place] [facility]   Surgeons: Diona Browner, DMD      DISCUSSION: Patient is a 69 year old female scheduled for the above procedure.  History includes never smoker, Alzheimer's dementia, hypercholesterolemia, schizophrenia, depression, prolonged QT (on EKG), GERD, anemia. S/p alveoloplasty/removal of bilateral mandibular lingual tori 08/20/20. S/p ORIF right ankle fracture 10/01/20.   Patient is scheduled for CT at [facility] at 10:30 AM on the morning of surgery. She is for same day COVID-19 testing due to transportation/mobilitly issues given her recent ankle fracture.  Anesthesia team to evaluate on the day of surgery.   VS: Ht 5\' 5"  (1.651 m)   Wt 90.7 kg   BMI 33.28 kg/m   BP Readings from Last 3 Encounters:  10/04/20 (!) 141/81  09/18/20 112/70  08/20/20 139/79    PROVIDERS: Sandi Mariscal, MD is PCP    LABS: For day of procedure. As of 10/02/20, H/H 11.9/36.7, PLT 286, Cr 0.88.   IMAGES: Xray mandible 10/01/20: FINDINGS: There is no evidence of fracture or other focal bone lesions. IMPRESSION: Negative.  1V CXR 06/09/20: IMPRESSION: Large hiatal hernia, similar to the recent comparison, new when compared to more remote priors.  CTA Chest 06/09/20: IMPRESSION: 1. No evidence of pulmonary embolism. 2. Dependent atelectatic changes with additional bandlike areas of further subsegmental atelectasis or cicatricial change. Findings likely exacerbated by imaging during exhalation. 3. Hiatal hernia with partial mesenteroaxial rotation. 4. No other acute intrathoracic process. 5. Sequela of prior granulomatous disease in the chest and abdomen.   EKG: Sinus rhythm Borderline intraventricular conduction delay Abnormal R-wave progression, early transition Borderline  prolonged QT interval [QT/QTc 485/497 ms] No significant change since last tracing Confirmed by Blanchie Dessert [phone]) on 06/09/2020 12:11:55 PM   CV: N/A  Past Medical History:  Diagnosis Date  . Alzheimer's dementia ([place])   . Anemia   . Carpal tunnel syndrome, bilateral   . Depression   . GERD (gastroesophageal reflux disease)   . Hypercholesteremia   . Prolonged Q-T interval on ECG 06/10/2020  . Schizophrenia [facility])     Past Surgical History:  Procedure Laterality Date  . ABDOMINAL HYSTERECTOMY    . ALVEOLOPLASTY Bilateral 08/20/2020   Procedure: ALVEOLOPLASTY;  Surgeon: Diona Browner, DDS;  Location: [place];  Service: Oral Surgery;  Laterality: Bilateral;  . CARPAL TUNNEL RELEASE    . COLONOSCOPY    . MANDIBLE SURGERY    . ORIF ANKLE FRACTURE Right 10/01/2020   Procedure: OPEN REDUCTION INTERNAL FIXATION (ORIF) ANKLE FRACTURE;  Surgeon: Nicholes Stairs, MD;  Location: [facility];  Service: Orthopedics;  Laterality: Right;  . TUMOR REMOVAL     on back    MEDICATIONS: No current facility-administered medications for this encounter.   Marland Kitchen aspirin EC 81 MG tablet  . BIOTIN PO  . buPROPion (WELLBUTRIN XL) 300 MG 24 hr tablet  . Cholecalciferol (VITAMIN D3) 50 MCG (2000 UT) TABS  . citalopram (CELEXA) 40 MG tablet  . fish oil-omega-3 fatty acids 1000 MG capsule  . gabapentin (NEURONTIN) 300 MG capsule  . galantamine (RAZADYNE) 12 MG tablet  . hydrOXYzine (VISTARIL) 50 MG capsule  . ibuprofen (ADVIL) 600 MG tablet  . methocarbamol (ROBAXIN) 500 MG tablet  . omeprazole (PRILOSEC) 40 MG capsule  . oxyCODONE-acetaminophen (PERCOCET/ROXICET) 5-325 MG  tablet  . QUEtiapine (SEROQUEL XR) 300 MG 24 hr tablet  . ramelteon (ROZEREM) 8 MG tablet  . simvastatin (ZOCOR) 80 MG tablet  . tiZANidine (ZANAFLEX) 4 MG tablet  . ondansetron (ZOFRAN ODT) 4 MG disintegrating tablet    Myra Gianotti, PA-C Surgical Short Stay/Anesthesiology [facility] Phone [phone] [facility] Phone [phone] 10/29/2020 2:43 PM

## 2020-10-30 ENCOUNTER — Ambulatory Visit (HOSPITAL_COMMUNITY): Payer: Medicare Other

## 2020-10-30 ENCOUNTER — Encounter (HOSPITAL_COMMUNITY): Payer: Self-pay

## 2020-10-30 ENCOUNTER — Other Ambulatory Visit: Payer: Self-pay

## 2020-10-30 ENCOUNTER — Ambulatory Visit (HOSPITAL_COMMUNITY): Payer: Medicare Other | Admitting: Vascular Surgery

## 2020-10-30 ENCOUNTER — Encounter (HOSPITAL_COMMUNITY)
Admission: AD | Disposition: A | Discharge status: Home Health | Payer: Self-pay | Source: Home / Self Care | Attending: Internal Medicine

## 2020-10-30 ENCOUNTER — Encounter (HOSPITAL_COMMUNITY): Payer: Self-pay | Admitting: Oral Surgery

## 2020-10-30 ENCOUNTER — Inpatient Hospital Stay (HOSPITAL_COMMUNITY)
Admission: AD | Admit: 2020-10-30 | Discharge: 2020-11-02 | DRG: 141 | Disposition: A | Discharge status: Home Health | Payer: Medicare Other | Attending: Internal Medicine | Admitting: Internal Medicine

## 2020-10-30 DIAGNOSIS — E7800 Pure hypercholesterolemia, unspecified: Secondary | ICD-10-CM | POA: Diagnosis present

## 2020-10-30 DIAGNOSIS — M8788 Other osteonecrosis, other site: Secondary | ICD-10-CM | POA: Diagnosis present

## 2020-10-30 DIAGNOSIS — Z01818 Encounter for other preprocedural examination: Secondary | ICD-10-CM

## 2020-10-30 DIAGNOSIS — Z6833 Body mass index (BMI) 33.0-33.9, adult: Secondary | ICD-10-CM | POA: Diagnosis not present

## 2020-10-30 DIAGNOSIS — Z7982 Long term (current) use of aspirin: Secondary | ICD-10-CM | POA: Diagnosis not present

## 2020-10-30 DIAGNOSIS — E876 Hypokalemia: Secondary | ICD-10-CM | POA: Diagnosis present

## 2020-10-30 DIAGNOSIS — F209 Schizophrenia, unspecified: Secondary | ICD-10-CM | POA: Diagnosis present

## 2020-10-30 DIAGNOSIS — G309 Alzheimer's disease, unspecified: Secondary | ICD-10-CM | POA: Diagnosis present

## 2020-10-30 DIAGNOSIS — K219 Gastro-esophageal reflux disease without esophagitis: Secondary | ICD-10-CM | POA: Diagnosis present

## 2020-10-30 DIAGNOSIS — F32A Depression, unspecified: Secondary | ICD-10-CM | POA: Diagnosis present

## 2020-10-30 DIAGNOSIS — E041 Nontoxic single thyroid nodule: Secondary | ICD-10-CM | POA: Diagnosis present

## 2020-10-30 DIAGNOSIS — Z20822 Contact with and (suspected) exposure to covid-19: Secondary | ICD-10-CM | POA: Diagnosis present

## 2020-10-30 DIAGNOSIS — M272 Inflammatory conditions of jaws: Secondary | ICD-10-CM | POA: Diagnosis present

## 2020-10-30 DIAGNOSIS — B954 Other streptococcus as the cause of diseases classified elsewhere: Secondary | ICD-10-CM | POA: Diagnosis present

## 2020-10-30 DIAGNOSIS — G894 Chronic pain syndrome: Secondary | ICD-10-CM | POA: Diagnosis not present

## 2020-10-30 DIAGNOSIS — Z66 Do not resuscitate: Secondary | ICD-10-CM | POA: Diagnosis present

## 2020-10-30 DIAGNOSIS — R9431 Abnormal electrocardiogram [ECG] [EKG]: Secondary | ICD-10-CM | POA: Diagnosis present

## 2020-10-30 DIAGNOSIS — Z79899 Other long term (current) drug therapy: Secondary | ICD-10-CM

## 2020-10-30 DIAGNOSIS — L0201 Cutaneous abscess of face: Secondary | ICD-10-CM | POA: Diagnosis present

## 2020-10-30 DIAGNOSIS — D649 Anemia, unspecified: Secondary | ICD-10-CM | POA: Diagnosis present

## 2020-10-30 DIAGNOSIS — E66811 Obesity, class 1: Secondary | ICD-10-CM

## 2020-10-30 DIAGNOSIS — E785 Hyperlipidemia, unspecified: Secondary | ICD-10-CM | POA: Diagnosis present

## 2020-10-30 DIAGNOSIS — F0280 Dementia in other diseases classified elsewhere without behavioral disturbance: Secondary | ICD-10-CM | POA: Diagnosis present

## 2020-10-30 DIAGNOSIS — Z888 Allergy status to other drugs, medicaments and biological substances status: Secondary | ICD-10-CM

## 2020-10-30 DIAGNOSIS — S82841D Displaced bimalleolar fracture of right lower leg, subsequent encounter for closed fracture with routine healing: Secondary | ICD-10-CM | POA: Diagnosis not present

## 2020-10-30 DIAGNOSIS — M869 Osteomyelitis, unspecified: Secondary | ICD-10-CM | POA: Diagnosis present

## 2020-10-30 DIAGNOSIS — R110 Nausea: Secondary | ICD-10-CM | POA: Diagnosis not present

## 2020-10-30 DIAGNOSIS — G8929 Other chronic pain: Secondary | ICD-10-CM | POA: Diagnosis present

## 2020-10-30 DIAGNOSIS — E6609 Other obesity due to excess calories: Secondary | ICD-10-CM | POA: Diagnosis not present

## 2020-10-30 HISTORY — PX: INCISION AND DRAINAGE ABSCESS: SHX5864

## 2020-10-30 HISTORY — DX: Chronic pain syndrome: G89.4

## 2020-10-30 HISTORY — DX: Other obesity due to excess calories: E66.09

## 2020-10-30 LAB — COMPREHENSIVE METABOLIC PANEL
ALT: 18 U/L (ref 0–44)
AST: 19 U/L (ref 15–41)
Albumin: 3.1 g/dL — ABNORMAL LOW (ref 3.5–5.0)
Alkaline Phosphatase: 99 U/L (ref 38–126)
Anion gap: 10 (ref 5–15)
BUN: 8 mg/dL (ref 8–23)
CO2: 26 mmol/L (ref 22–32)
Calcium: 9.1 mg/dL (ref 8.9–10.3)
Chloride: 104 mmol/L (ref 98–111)
Creatinine, Ser: 0.68 mg/dL (ref 0.44–1.00)
GFR, Estimated: >60 mL/min (ref >60)
Glucose, Bld: 152 mg/dL — ABNORMAL HIGH (ref 70–99)
Potassium: 3.2 mmol/L — ABNORMAL LOW (ref 3.5–5.1)
Sodium: 140 mmol/L (ref 135–145)
Total Bilirubin: 0.5 mg/dL (ref 0.3–1.2)
Total Protein: 6.5 g/dL (ref 6.5–8.1)

## 2020-10-30 LAB — SARS CORONAVIRUS 2 BY RT PCR (HOSPITAL ORDER, PERFORMED IN ~~LOC~~ HOSPITAL LAB): SARS Coronavirus 2: NEGATIVE

## 2020-10-30 LAB — CBC
HCT: 38.3 % (ref 36.0–46.0)
Hemoglobin: 12.9 g/dL (ref 12.0–15.0)
MCH: 28.8 pg (ref 26.0–34.0)
MCHC: 33.7 g/dL (ref 30.0–36.0)
MCV: 85.5 fL (ref 80.0–100.0)
Platelets: 324 10*3/uL (ref 150–400)
RBC: 4.48 MIL/uL (ref 3.87–5.11)
RDW: 14.5 % (ref 11.5–15.5)
WBC: 7.4 10*3/uL (ref 4.0–10.5)
nRBC: 0 % (ref 0.0–0.2)

## 2020-10-30 SURGERY — INCISION AND DRAINAGE, ABSCESS
Anesthesia: General | Site: Chin

## 2020-10-30 MED ORDER — DEXAMETHASONE SODIUM PHOSPHATE 10 MG/ML IJ SOLN
INTRAMUSCULAR | Status: DC | PRN
  Administered 2020-10-30: 10 mg via INTRAVENOUS

## 2020-10-30 MED ORDER — ONDANSETRON HCL 4 MG/2ML IJ SOLN: 4.0000 mg | Freq: Once | INTRAMUSCULAR | Status: DC | PRN

## 2020-10-30 MED ORDER — ACETAMINOPHEN 325 MG PO TABS: 650.0000 mg | ORAL_TABLET | Freq: Four times a day (QID) | ORAL | Status: DC | PRN

## 2020-10-30 MED ORDER — LIDOCAINE-EPINEPHRINE 1 %-1:100000 IJ SOLN
INTRAMUSCULAR | Status: AC
  Filled 2020-10-30: qty 1

## 2020-10-30 MED ORDER — BUPROPION HCL ER (XL) 300 MG PO TB24
300.0000 mg | ORAL_TABLET | Freq: Every day | ORAL | Status: DC
  Administered 2020-10-30 – 2020-11-01 (×3): 300 mg via ORAL
  Filled 2020-10-30 (×3): qty 1

## 2020-10-30 MED ORDER — METRONIDAZOLE IN NACL 5-0.79 MG/ML-% IV SOLN
500.0000 mg | Freq: Three times a day (TID) | INTRAVENOUS | Status: DC
  Administered 2020-10-30 – 2020-10-31 (×2): 500 mg via INTRAVENOUS
  Filled 2020-10-30 (×2): qty 100

## 2020-10-30 MED ORDER — FENTANYL CITRATE (PF) 100 MCG/2ML IJ SOLN
INTRAMUSCULAR | Status: AC
  Administered 2020-10-30: 15:00:00 50 ug via INTRAVENOUS
  Filled 2020-10-30: qty 2

## 2020-10-30 MED ORDER — POLYETHYLENE GLYCOL 3350 17 G PO PACK: 17.0000 g | PACK | Freq: Every day | ORAL | Status: DC | PRN

## 2020-10-30 MED ORDER — 0.9 % SODIUM CHLORIDE (POUR BTL) OPTIME
TOPICAL | Status: DC | PRN
  Administered 2020-10-30: 15:00:00 1000 mL

## 2020-10-30 MED ORDER — MORPHINE SULFATE (PF) 2 MG/ML IV SOLN: 2.0000 mg | INTRAVENOUS | Status: DC | PRN

## 2020-10-30 MED ORDER — LIDOCAINE 2% (20 MG/ML) 5 ML SYRINGE
INTRAMUSCULAR | Status: DC | PRN
  Administered 2020-10-30: 60 mg via INTRAVENOUS

## 2020-10-30 MED ORDER — ONDANSETRON HCL 4 MG/2ML IJ SOLN
INTRAMUSCULAR | Status: DC | PRN
  Administered 2020-10-30: 4 mg via INTRAVENOUS

## 2020-10-30 MED ORDER — PROPOFOL 10 MG/ML IV BOLUS
INTRAVENOUS | Status: DC | PRN
  Administered 2020-10-30: 110 mg via INTRAVENOUS

## 2020-10-30 MED ORDER — SUGAMMADEX SODIUM 200 MG/2ML IV SOLN
INTRAVENOUS | Status: DC | PRN
  Administered 2020-10-30: 200 mg via INTRAVENOUS

## 2020-10-30 MED ORDER — LACTATED RINGERS IV SOLN: INTRAVENOUS | Status: DC

## 2020-10-30 MED ORDER — CHLORHEXIDINE GLUCONATE 0.12 % MT SOLN
OROMUCOSAL | Status: AC
  Administered 2020-10-30: 12:00:00 15 mL via OROMUCOSAL
  Filled 2020-10-30: qty 15

## 2020-10-30 MED ORDER — FENTANYL CITRATE (PF) 100 MCG/2ML IJ SOLN
25.0000 ug | INTRAMUSCULAR | Status: DC | PRN
  Administered 2020-10-30: 50 ug via INTRAVENOUS

## 2020-10-30 MED ORDER — FENTANYL CITRATE (PF) 250 MCG/5ML IJ SOLN
INTRAMUSCULAR | Status: DC | PRN
  Administered 2020-10-30: 50 ug via INTRAVENOUS
  Administered 2020-10-30 (×2): 100 ug via INTRAVENOUS

## 2020-10-30 MED ORDER — QUETIAPINE FUMARATE ER 300 MG PO TB24
600.0000 mg | ORAL_TABLET | Freq: Every day | ORAL | Status: DC
  Administered 2020-10-30 – 2020-11-01 (×3): 600 mg via ORAL
  Filled 2020-10-30 (×3): qty 2

## 2020-10-30 MED ORDER — PHENYLEPHRINE 40 MCG/ML (10ML) SYRINGE FOR IV PUSH (FOR BLOOD PRESSURE SUPPORT)
PREFILLED_SYRINGE | INTRAVENOUS | Status: AC
  Filled 2020-10-30: qty 10

## 2020-10-30 MED ORDER — METHOCARBAMOL 500 MG PO TABS
500.0000 mg | ORAL_TABLET | Freq: Four times a day (QID) | ORAL | Status: DC | PRN
  Administered 2020-10-30 – 2020-11-01 (×3): 500 mg via ORAL
  Filled 2020-10-30 (×3): qty 1

## 2020-10-30 MED ORDER — PANTOPRAZOLE SODIUM 40 MG PO TBEC
40.0000 mg | DELAYED_RELEASE_TABLET | Freq: Two times a day (BID) | ORAL | Status: DC
  Administered 2020-10-30 – 2020-11-01 (×5): 40 mg via ORAL
  Filled 2020-10-30 (×5): qty 1

## 2020-10-30 MED ORDER — LIDOCAINE-EPINEPHRINE 1 %-1:100000 IJ SOLN
INTRAMUSCULAR | Status: DC | PRN
  Administered 2020-10-30: 8 mL

## 2020-10-30 MED ORDER — RAMELTEON 8 MG PO TABS
8.0000 mg | ORAL_TABLET | Freq: Every day | ORAL | Status: DC
  Administered 2020-10-30 – 2020-11-01 (×3): 8 mg via ORAL
  Filled 2020-10-30 (×3): qty 1

## 2020-10-30 MED ORDER — HYDROMORPHONE HCL 1 MG/ML IJ SOLN
0.2500 mg | INTRAMUSCULAR | Status: DC | PRN
  Administered 2020-10-30: 0.5 mg via INTRAVENOUS

## 2020-10-30 MED ORDER — ATORVASTATIN CALCIUM 40 MG PO TABS
40.0000 mg | ORAL_TABLET | Freq: Every day | ORAL | Status: DC
  Administered 2020-10-31 – 2020-11-01 (×2): 40 mg via ORAL
  Filled 2020-10-30 (×2): qty 1

## 2020-10-30 MED ORDER — SODIUM CHLORIDE 0.9 % IV SOLN
2.0000 g | INTRAVENOUS | Status: DC
  Administered 2020-10-30 – 2020-11-01 (×3): 2 g via INTRAVENOUS
  Filled 2020-10-30 (×4): qty 20

## 2020-10-30 MED ORDER — CEFAZOLIN SODIUM-DEXTROSE 2-4 GM/100ML-% IV SOLN
2.0000 g | INTRAVENOUS | Status: AC
  Administered 2020-10-30: 14:00:00 2 g via INTRAVENOUS
  Filled 2020-10-30: qty 100

## 2020-10-30 MED ORDER — ORAL CARE MOUTH RINSE: 15.0000 mL | Freq: Once | OROMUCOSAL | Status: AC

## 2020-10-30 MED ORDER — BISACODYL 5 MG PO TBEC: 5.0000 mg | DELAYED_RELEASE_TABLET | Freq: Every day | ORAL | Status: DC | PRN

## 2020-10-30 MED ORDER — OXYCODONE-ACETAMINOPHEN 5-325 MG PO TABS
0.5000 | ORAL_TABLET | Freq: Four times a day (QID) | ORAL | Status: DC | PRN
  Administered 2020-10-30 – 2020-11-01 (×5): 0.5 via ORAL
  Filled 2020-10-30 (×5): qty 1

## 2020-10-30 MED ORDER — IOHEXOL 300 MG/ML  SOLN
75.0000 mL | Freq: Once | INTRAMUSCULAR | Status: AC | PRN
  Administered 2020-10-30: 75 mL via INTRAVENOUS

## 2020-10-30 MED ORDER — ONDANSETRON HCL 4 MG/2ML IJ SOLN: 4.0000 mg | Freq: Four times a day (QID) | INTRAMUSCULAR | Status: DC | PRN

## 2020-10-30 MED ORDER — HYDROMORPHONE HCL 1 MG/ML IJ SOLN
INTRAMUSCULAR | Status: AC
  Filled 2020-10-30: qty 1

## 2020-10-30 MED ORDER — ASPIRIN EC 81 MG PO TBEC
81.0000 mg | DELAYED_RELEASE_TABLET | ORAL | Status: DC
  Administered 2020-10-31 – 2020-11-02 (×3): 81 mg via ORAL
  Filled 2020-10-30 (×4): qty 1

## 2020-10-30 MED ORDER — CITALOPRAM HYDROBROMIDE 40 MG PO TABS
40.0000 mg | ORAL_TABLET | Freq: Every day | ORAL | Status: DC
  Administered 2020-10-31 – 2020-11-01 (×2): 40 mg via ORAL
  Filled 2020-10-30: qty 1
  Filled 2020-10-30: qty 2
  Filled 2020-10-30: qty 1
  Filled 2020-10-30: qty 2
  Filled 2020-10-30: qty 1

## 2020-10-30 MED ORDER — PHENYLEPHRINE 40 MCG/ML (10ML) SYRINGE FOR IV PUSH (FOR BLOOD PRESSURE SUPPORT)
PREFILLED_SYRINGE | INTRAVENOUS | Status: DC | PRN
  Administered 2020-10-30: 80 ug via INTRAVENOUS

## 2020-10-30 MED ORDER — HYDROXYZINE HCL 50 MG PO TABS
50.0000 mg | ORAL_TABLET | Freq: Three times a day (TID) | ORAL | Status: DC | PRN
  Filled 2020-10-30: qty 1

## 2020-10-30 MED ORDER — DOCUSATE SODIUM 100 MG PO CAPS
100.0000 mg | ORAL_CAPSULE | Freq: Two times a day (BID) | ORAL | Status: DC
  Administered 2020-11-01: 22:00:00 100 mg via ORAL
  Filled 2020-10-30 (×5): qty 1

## 2020-10-30 MED ORDER — ACETAMINOPHEN 650 MG RE SUPP: 650.0000 mg | Freq: Four times a day (QID) | RECTAL | Status: DC | PRN

## 2020-10-30 MED ORDER — SODIUM CHLORIDE 0.9 % IR SOLN
Status: DC | PRN
  Administered 2020-10-30: 1000 mL

## 2020-10-30 MED ORDER — ONDANSETRON HCL 4 MG PO TABS: 4.0000 mg | ORAL_TABLET | Freq: Four times a day (QID) | ORAL | Status: DC | PRN

## 2020-10-30 MED ORDER — DEXAMETHASONE SODIUM PHOSPHATE 10 MG/ML IJ SOLN
INTRAMUSCULAR | Status: AC
  Filled 2020-10-30: qty 1

## 2020-10-30 MED ORDER — ROCURONIUM BROMIDE 10 MG/ML (PF) SYRINGE
PREFILLED_SYRINGE | INTRAVENOUS | Status: DC | PRN
  Administered 2020-10-30: 90 mg via INTRAVENOUS

## 2020-10-30 MED ORDER — CHLORHEXIDINE GLUCONATE 0.12 % MT SOLN: 15.0000 mL | Freq: Once | OROMUCOSAL | Status: AC

## 2020-10-30 MED ORDER — FENTANYL CITRATE (PF) 250 MCG/5ML IJ SOLN
INTRAMUSCULAR | Status: AC
  Filled 2020-10-30: qty 5

## 2020-10-30 MED ORDER — GABAPENTIN 300 MG PO CAPS
300.0000 mg | ORAL_CAPSULE | Freq: Every day | ORAL | Status: DC
  Administered 2020-10-30 – 2020-11-01 (×3): 300 mg via ORAL
  Filled 2020-10-30 (×3): qty 1

## 2020-10-30 MED ORDER — GALANTAMINE HYDROBROMIDE 4 MG PO TABS
12.0000 mg | ORAL_TABLET | Freq: Two times a day (BID) | ORAL | Status: DC
  Administered 2020-10-30 – 2020-11-01 (×5): 12 mg via ORAL
  Filled 2020-10-30 (×6): qty 3

## 2020-10-30 MED ORDER — ONDANSETRON HCL 4 MG/2ML IJ SOLN
INTRAMUSCULAR | Status: AC
  Filled 2020-10-30: qty 2

## 2020-10-30 MED ORDER — HYDRALAZINE HCL 20 MG/ML IJ SOLN: 5.0000 mg | INTRAMUSCULAR | Status: DC | PRN

## 2020-10-30 MED ORDER — DEXMEDETOMIDINE (PRECEDEX) IN NS 20 MCG/5ML (4 MCG/ML) IV SYRINGE
PREFILLED_SYRINGE | INTRAVENOUS | Status: DC | PRN
  Administered 2020-10-30: 20 ug via INTRAVENOUS

## 2020-10-30 SURGICAL SUPPLY — 41 items
BLADE SURG 15 STRL LF DISP TIS (BLADE) ×1 IMPLANT
BLADE SURG 15 STRL SS (BLADE) ×1
BNDG CONFORM 2 STRL LF (GAUZE/BANDAGES/DRESSINGS) ×1 IMPLANT
BUR EGG ELITE 4.0 (BURR) ×1 IMPLANT
CONT SPEC 4OZ CLIKSEAL STRL BL (MISCELLANEOUS) ×1 IMPLANT
COVER SURGICAL LIGHT HANDLE (MISCELLANEOUS) ×2 IMPLANT
COVER WAND RF STERILE (DRAPES) ×1 IMPLANT
DRAIN PENROSE 1/4X12 LTX STRL (WOUND CARE) ×2 IMPLANT
DRSG OPSITE 4X5.5 SM (GAUZE/BANDAGES/DRESSINGS) ×1 IMPLANT
DRSG PAD ABDOMINAL 8X10 ST (GAUZE/BANDAGES/DRESSINGS) IMPLANT
ELECT COATED BLADE 2.86 ST (ELECTRODE) IMPLANT
ELECT REM PT RETURN 9FT ADLT (ELECTROSURGICAL) ×2
ELECTRODE REM PT RTRN 9FT ADLT (ELECTROSURGICAL) ×1 IMPLANT
GAUZE 4X4 16PLY RFD (DISPOSABLE) ×2 IMPLANT
GAUZE SPONGE 4X4 12PLY STRL (GAUZE/BANDAGES/DRESSINGS) ×1 IMPLANT
GLOVE BIO SURGEON STRL SZ7 (GLOVE) ×1 IMPLANT
GLOVE BIO SURGEON STRL SZ8 (GLOVE) ×2 IMPLANT
GOWN STRL REUS W/ TWL LRG LVL3 (GOWN DISPOSABLE) ×1 IMPLANT
GOWN STRL REUS W/ TWL XL LVL3 (GOWN DISPOSABLE) IMPLANT
GOWN STRL REUS W/TWL LRG LVL3 (GOWN DISPOSABLE) ×1
GOWN STRL REUS W/TWL XL LVL3 (GOWN DISPOSABLE) ×1
KIT BASIN OR (CUSTOM PROCEDURE TRAY) ×2 IMPLANT
KIT TURNOVER KIT B (KITS) ×2 IMPLANT
MARKER SKIN DUAL TIP RULER LAB (MISCELLANEOUS) ×2 IMPLANT
NDL HYPO 25GX1X1/2 BEV (NEEDLE) ×1 IMPLANT
NEEDLE HYPO 25GX1X1/2 BEV (NEEDLE) ×2 IMPLANT
NS IRRIG 1000ML POUR BTL (IV SOLUTION) ×2 IMPLANT
PACK SURGICAL SETUP 50X90 (CUSTOM PROCEDURE TRAY) ×2 IMPLANT
PAD ARMBOARD 7.5X6 YLW CONV (MISCELLANEOUS) ×4 IMPLANT
PENCIL BUTTON HOLSTER BLD 10FT (ELECTRODE) IMPLANT
POSITIONER HEAD DONUT 9IN (MISCELLANEOUS) IMPLANT
SLEEVE IRRIGATION ELITE 7 (MISCELLANEOUS) ×1 IMPLANT
SUT CHROMIC 3 0 PS 2 (SUTURE) ×1 IMPLANT
SUT CHROMIC 3 0 SH 27 (SUTURE) ×1 IMPLANT
SUT ETHILON 2 0 FS 18 (SUTURE) ×2 IMPLANT
SWAB COLLECTION DEVICE MRSA (MISCELLANEOUS) ×2 IMPLANT
SWAB CULTURE ESWAB REG 1ML (MISCELLANEOUS) ×2 IMPLANT
SYR BULB IRRIG 60ML STRL (SYRINGE) ×2 IMPLANT
SYR CONTROL 10ML LL (SYRINGE) ×2 IMPLANT
TUBE CONNECTING 12X1/4 (SUCTIONS) ×2 IMPLANT
TUBING IRRIGATION (MISCELLANEOUS) ×1 IMPLANT

## 2020-10-30 NOTE — H&P (Signed)
H&P documentation  -History and Physical Reviewed  -Patient has been re-examined  -No change in the plan of care  [name]  

## 2020-10-30 NOTE — Anesthesia Procedure Notes (Signed)
Procedure Name: Intubation Date/Time: 10/30/2020 1:44 PM Performed by: Mariea Clonts, CRNA Pre-anesthesia Checklist: Patient identified, Emergency Drugs available, Suction available and Patient being monitored Patient Re-evaluated:Patient Re-evaluated prior to induction Oxygen Delivery Method: Circle System Utilized Preoxygenation: Pre-oxygenation with 100% oxygen Induction Type: IV induction Ventilation: Mask ventilation without difficulty Laryngoscope Size: Mac and 3 Grade View: Grade II Tube type: Oral Nasal Tubes: Right, Magill forceps- large, utilized, Nasal Rae and Nasal prep performed Tube size: 6.5 mm Number of attempts: 1 Airway Equipment and Method: Stylet and Oral airway Placement Confirmation: ETT inserted through vocal cords under direct vision,  positive ETCO2 and breath sounds checked- equal and bilateral Tube secured with: Tape Dental Injury: Teeth and Oropharynx as per pre-operative assessment

## 2020-10-30 NOTE — Progress Notes (Signed)
Pt arrived to the unit 5N04, A&Ox4, has a gauze and transparent dressing on the chin with ice pack. Minimal drainage noted on the dressing. Cast on the right ankle noted. Pt able to pivot from bed to [facility] to void.

## 2020-10-30 NOTE — H&P (Signed)
History and Physical    [name] LGX:[id number] DOB: Sep 16, 1951 DOA: 10/30/2020  PCP: Sandi Mariscal, MD Consultants:  Hoyt Koch - oral surgery Patient coming from:  Home - lives with son; NOK: Anita, Moran, [phone]  Chief Complaint:  Jaw pain  HPI: [name] is a 69 y.o. female with medical history significant of schizophrenia; HLD; and dementia presenting with jaw pain.  Interestingly, she as admitted for R ankle bimalleolar fracture 1 month ago with ORIF; she was discharged to home in the care of her son.  She underwent mandibular alveoloplasty and lingual tori removal on 10/12.  She started having pain and swelling about 1 week ago.  She saw Dr. Hoyt Koch yesterday and was scheduled for I&D of sublingual/submental space abscess of the mandible.  However, she was also ordered to have CT pre-operatively and that CT showed osteomyelitis.  As such, rather than same day surgery she is now in need of admission for pain control, IV antibiotics, and ID consultation.  At the time of my evaluation, she was having significant jaw pain and was very uncomfortable in PACU.   ED Course:  I&D on chin, osteo of the mandible with debridement.  Needs med management, ID consult.  Needs pain management, diet is ok, on Unasyn for now.  Review of Systems: As per HPI; otherwise review of systems reviewed and negative. Limited by obvious discomfort.   COVID Vaccine Status:  Unknown  Past Medical History:  Diagnosis Date  . Alzheimer's dementia ([place])   . Anemia   . Carpal tunnel syndrome, bilateral   . Chronic pain disorder 10/30/2020  . Class 1 obesity due to excess calories with body mass index (BMI) of 33.0 to 33.9 in adult 10/30/2020  . Depression   . GERD (gastroesophageal reflux disease)   . Hypercholesteremia   . Prolonged Q-T interval on ECG 06/10/2020  . Schizophrenia [facility])     Past Surgical History:  Procedure Laterality Date  . ABDOMINAL HYSTERECTOMY    . ALVEOLOPLASTY  Bilateral 08/20/2020   Procedure: ALVEOLOPLASTY;  Surgeon: Diona Browner, DDS;  Location: [place];  Service: Oral Surgery;  Laterality: Bilateral;  . CARPAL TUNNEL RELEASE    . COLONOSCOPY    . MANDIBLE SURGERY    . ORIF ANKLE FRACTURE Right 10/01/2020   Procedure: OPEN REDUCTION INTERNAL FIXATION (ORIF) ANKLE FRACTURE;  Surgeon: Nicholes Stairs, MD;  Location: [facility];  Service: Orthopedics;  Laterality: Right;  . TUMOR REMOVAL     on back    Social History   Socioeconomic History  . Marital status: Single    Spouse name: Not on file  . Number of children: Not on file  . Years of education: Not on file  . Highest education level: Not on file  Occupational History  . Not on file  Tobacco Use  . Smoking status: Never Smoker  . Smokeless tobacco: Never Used  Vaping Use  . Vaping Use: Never used  Substance and Sexual Activity  . Alcohol use: No  . Drug use: No  . Sexual activity: Not on file  Other Topics Concern  . Not on file  Social History Narrative  . Not on file   Social Determinants of Health   Financial Resource Strain: Not on file  Food Insecurity: Not on file  Transportation Needs: Not on file  Physical Activity: Not on file  Stress: Not on file  Social Connections: Not on file  Intimate Partner Violence: Not on file  Allergies  Allergen Reactions  . Donepezil Other (See Comments)    Other reaction(s): CHEST PAINS & THRUSH,PALPITATIONS  Pt denies  . Estrogens Other (See Comments)    Other reaction(s): HRT-DVT Patient un aware    History reviewed. No pertinent family history.  Prior to Admission medications   Medication Sig Start Date End Date Taking? Authorizing Provider  aspirin EC 81 MG tablet Take 81 mg by mouth every morning.   Yes [provider]  BIOTIN PO Take 1 tablet by mouth every morning.   Yes [provider]  buPROPion (WELLBUTRIN XL) 300 MG 24 hr tablet Take 300 mg by mouth at bedtime.   Yes  [provider]  Cholecalciferol (VITAMIN D3) 50 MCG (2000 UT) TABS Take 2,000 Units by mouth daily.   Yes [provider]  citalopram (CELEXA) 40 MG tablet Take 40 mg by mouth daily.   Yes [provider]  fish oil-omega-3 fatty acids 1000 MG capsule Take 1,000 mg by mouth daily.   Yes [provider]  gabapentin (NEURONTIN) 300 MG capsule Take 300 mg by mouth at bedtime. 05/31/20  Yes [provider]  galantamine (RAZADYNE) 12 MG tablet Take 12 mg by mouth 2 (two) times daily. 07/19/20  Yes [provider]  hydrOXYzine (VISTARIL) 50 MG capsule Take 50 mg by mouth 3 (three) times daily as needed for anxiety.  08/13/20  Yes [provider]  ibuprofen (ADVIL) 600 MG tablet Take 1 tablet (600 mg total) by mouth every 6 (six) hours as needed. Patient taking differently: Take 400 mg by mouth every 6 (six) hours as needed for mild pain or moderate pain. 08/20/20  Yes Diona Browner, DMD  methocarbamol (ROBAXIN) 500 MG tablet Take 1 tablet (500 mg total) by mouth every 6 (six) hours as needed for muscle spasms. 10/01/20  Yes Nicholes Stairs, MD  omeprazole (PRILOSEC) 40 MG capsule Take 40 mg by mouth in the morning and at bedtime.   Yes [provider]  oxyCODONE-acetaminophen (PERCOCET/ROXICET) 5-325 MG tablet Take 0.5 tablets by mouth 4 (four) times daily as needed for severe pain. 09/12/20  Yes [provider]  QUEtiapine (SEROQUEL XR) 300 MG 24 hr tablet Take 600 mg by mouth at bedtime.   Yes [provider]  ramelteon (ROZEREM) 8 MG tablet Take 8 mg by mouth at bedtime. 08/14/20  Yes [provider]  simvastatin (ZOCOR) 80 MG tablet Take 80 mg by mouth at bedtime.   Yes [provider]  tiZANidine (ZANAFLEX) 4 MG tablet Take 4 mg by mouth 3 (three) times daily as needed for muscle spasms. 10/09/20  Yes [provider]  ondansetron (ZOFRAN ODT) 4 MG disintegrating tablet 4mg  ODT q4 hours  prn nausea/vomit Patient not taking: No sig reported 11/18/14   Anita Morrison, MD    Physical Exam: Vitals:   10/30/20 1450 10/30/20 1505 10/30/20 1520 10/30/20 1535  BP: (!) 151/79 (!) 167/78 (!) 160/80 (!) 171/86  Pulse: 82 82 78 80  Resp: 14 17 (!) 6 15  Temp: 98.5 F (36.9 C)     TempSrc:      SpO2:  100% 100% 100%  Weight:      Height:         . General:  Appears very uncomfortable in PACU, lying with ice packs around jaw and cloth covering her eyes and face . Eyes:  EOMI, normal lids, iris . ENT:  grossly normal hearing, ice packs surround lower face, surgical incisions  on B mandible are C/D/I . Cardiovascular:  RRR, no m/r/g. No LE edema.  Marland Kitchen Respiratory:   CTA bilaterally with no wheezes/rales/rhonchi.  Normal respiratory effort. . Abdomen:  soft, NT, ND, NABS . Skin:  no rash or induration seen on limited exam . Musculoskeletal:  RLE is casted distally . Psychiatric:  flat mood and affect, speech limited by pain . Neurologic:  Unable to effectively perform    Radiological Exams on Admission: Independently reviewed - see discussion in A/P where applicable  DG Orthopantogram  Result Date: 10/30/2020 CLINICAL DATA:  Preoperative assessment EXAM: ORTHOPANTOGRAM/PANORAMIC COMPARISON:  None. FINDINGS: Patient is edentulous. Soft tissue prominence noted inferiorly at the level of the midline mandible at the symphysis level. No air or calcification within this soft tissue prominence. No abnormal periosteal reaction. No fracture or dislocation. Small lucencies in the left and right mandibular bodies medially may represent localized areas of osteoporosis. Maxillary antra appear clear. IMPRESSION: Soft tissue masslike area along the inferior and deep volar symphysis measuring 5.7 x 1.8 cm. No associated air in this area. No bony erosion. Suspect areas of osteoporosis in the medial body of each mandible. No frank bony destruction evident. Patient edentulous. Suspect areas of  osteoporosis in the medial aspect of each mandibular body. Electronically Signed   By: Lowella Grip III M.D.   On: 10/30/2020 11:25   CT SOFT TISSUE NECK W CONTRAST  Result Date: 10/30/2020 CLINICAL DATA:  Sublingual submental abscess. Question osteomyelitis of the mandible. EXAM: CT NECK WITH CONTRAST TECHNIQUE: Multidetector CT imaging of the neck was performed using the standard protocol following the bolus administration of intravenous contrast. CONTRAST:  56mL OMNIPAQUE IOHEXOL 300 MG/ML  SOLN COMPARISON:  Panorex same day.  Mandibular radiographs 10/01/2020. FINDINGS: Pharynx and larynx: No mucosal or submucosal lesion. In the sublingual space on the left, there is a well-circumscribed 9 x 14 mm density which could be a lymph node or a sublingual mass. Salivary glands: Parotid glands are normal. Submandibular glands are normal. Thyroid: 12 mm thyroid nodule of the right thyroid lobe. Lymph nodes: No other lymph nodes appear abnormal. Vascular: Minimal atherosclerosis of the carotid bifurcations. Limited intracranial: Normal Visualized orbits: Normal Mastoids and visualized paranasal sinuses: Clear Skeleton: No evidence of regional fracture. The patient does have an abnormal appearance of the mandibular symphysis, with irregular lucency worrisome for avascular necrosis and or osteomyelitis, with cloaca formation. Upper chest: Normal Other: Soft tissue inflammatory change just inferior to the symphysis of the mandible measuring about 1.5 cm in size consistent with local phlegmonous inflammation. No evidence of a drainable low-density collection. IMPRESSION: 1. Soft tissue inflammatory change just inferior to the symphysis of the mandible measuring about 1.5 cm in size consistent with local phlegmonous inflammation. No evidence of a drainable low-density collection. 2. Abnormal appearance of the mandibular symphysis, with irregular lucency worrisome for avascular necrosis or, more likely, osteomyelitis,  with cloaca formation. The entire segment of bone between the right and the left mental foramina appears to be involved. 3. 9 x 14 mm well-circumscribed density in the sublingual space on the left which could be a lymph node or a sublingual mass. 4. 12 mm thyroid nodule of the right thyroid lobe. No follow-up recommended. No followup recommended (ref: J Am Coll Radiol. 2015 Feb;12(2): 143-50). Electronically Signed   By: Nelson Chimes M.D.   On: 10/30/2020 12:59    EKG: not done   Labs on Admission: I have personally reviewed the available labs and imaging studies at  the time of the admission.  Pertinent labs:   Normal CBC COVID negative  Assessment/Plan Principal Problem:   Acute osteomyelitis of mandible Active Problems:   Schizophrenia ([place])   Hypercholesteremia   Alzheimer's dementia ([facility])   Chronic pain disorder   Class 1 obesity due to excess calories with body mass index (BMI) of 33.0 to 33.9 in adult   Mandibular osteomyelitis -Patient with prior mandibular alveoloplasty and lingual tori removal on 10/12 -Pain and swelling for the last week -Seen yesterday, concern for abscess vs. Osteo of the jaw -Plan for I&D but then CT confirmed osteo -I&D and mandibular debridement performed -Needs long-term antibiotics - d/w ID and will start Rocephin/Flagyl for now -ID to consult -Pain control with Percocet/morphine -Hold AC but continue ASA, resuming tomorrow  Dementia -Continue Razadyne  Schizophrenia -Continue home meds - Wellbutrin, Celexa, Vistaril, Seroquel, Rozerem  Recent R ankle fracture -s/p ORIF -RLE cast in place  Thyroid nodule -No f/u is needed per radiology  HLD -She does not appear to be taking medications for this issue at this time  Chronic pain -I have reviewed this patient in the [place] Controlled Substances Reporting System.  She is generally receiving medications from only one provider and appears to be taking them as prescribed. -She is at high risk  of opioid misuse, diversion, or overdose. -Continue home Robaxin, Percocet; add morphine as needed for now and she may require PCA for pain control given the severity of her acute pain. -She also appears to be taking Zanaflex; she should not need multiple muscle relaxants so will hold for now  Obesity -Body mass index is 33.12 kg/m..  -Weight loss should be encouraged -Outpatient PCP/bariatric medicine/bariatric surgery f/u encouraged      Note: This patient has been tested and is negative for the novel coronavirus COVID-19. The patient has been fully vaccinated against COVID-19.    DVT prophylaxis: SCDs Code Status:  DNR - confirmed with patient Family Communication: None present; Dr. Hoyt Koch spoke with the patient's son by telephone at the time of admission. Disposition Plan:  The patient is from: home  Anticipated d/c is to: home without [facility] services   Anticipated d/c date will depend on clinical response to treatment, but likely 2-4 days Patient is currently: acutely ill Consults called: ID; oral surgery Admission status:  Admit - It is my clinical opinion that admission to INPATIENT is reasonable and necessary because of the expectation that this patient will require hospital care that crosses at least 2 midnights to treat this condition based on the medical complexity of the problems presented.  Given the aforementioned information, the predictability of an adverse outcome is felt to be significant.    Karmen Bongo MD [facility]   How to contact the [facility] Attending or Consulting provider [place] or covering provider during after hours [place], for this patient?  1. Check the care team in [facility] and look for a) attending/consulting TR[facility] provider listed and b) the [facility] team listed 2. Log into [email or web address] and use [place] universal password to access. If you do not have the password, please contact the hospital operator. 3. Locate the [facility] provider you are looking for  under [facility] and page to a number that you can be directly reached. 4. If you still have difficulty reaching the provider, please page the [facility] (Director on Call) for the Hospitalists listed on amion for assistance.   10/30/2020, 4:12 PM

## 2020-10-30 NOTE — Transfer of Care (Signed)
Immediate Anesthesia Transfer of Care Note  Patient: [name]  Procedure(s) Performed: INCISION AND DRAINAGE SUBMENTAL SUBLINGUAL SPACE INFECTION, DEBRIDEMENT MANDIBLE (N/A Chin)  Patient Location: PACU  Anesthesia Type:General  Level of Consciousness: awake, patient cooperative and responds to stimulation  Airway & Oxygen Therapy: Patient Spontanous Breathing and Patient connected to nasal cannula oxygen  Post-op Assessment: Report given to RN and Post -op Vital signs reviewed and stable  Post vital signs: Reviewed and stable  Last Vitals:  Vitals Value Taken Time  BP 151/79 10/30/20 1451  Temp    Pulse 84 10/30/20 1453  Resp 14 10/30/20 1453  SpO2 99 % 10/30/20 1453  Vitals shown include unvalidated device data.  Last Pain:  Vitals:   10/30/20 1122  TempSrc:   PainSc: 7       Patients Stated Pain Goal: 2 (74/14/23 9532)  Complications: No complications documented.

## 2020-10-30 NOTE — Anesthesia Postprocedure Evaluation (Signed)
Anesthesia Post Note  Patient: CHANTALE LEUGERS  Procedure(s) Performed: INCISION AND DRAINAGE SUBMENTAL SUBLINGUAL SPACE INFECTION, DEBRIDEMENT MANDIBLE (N/A Chin)     Patient location during evaluation: PACU Anesthesia Type: General Level of consciousness: awake and alert Pain management: pain level controlled Vital Signs Assessment: post-procedure vital signs reviewed and stable Respiratory status: spontaneous breathing, nonlabored ventilation, respiratory function stable and patient connected to nasal cannula oxygen Cardiovascular status: blood pressure returned to baseline and stable Postop Assessment: no apparent nausea or vomiting Anesthetic complications: no   No complications documented.  Last Vitals:  Vitals:   10/30/20 1720 10/30/20 1750  BP: (!) 155/93 (!) 156/88  Pulse: 83 85  Resp: 15 14  Temp:    SpO2: 94% 94%    Last Pain:  Vitals:   10/30/20 1750  TempSrc:   PainSc: 5                  Yamaris Cummings DAVID

## 2020-10-30 NOTE — Op Note (Signed)
10/30/2020  2:40 PM  PATIENT:  [name]  69 y.o. female  PRE-OPERATIVE DIAGNOSIS:  Sublingual/submental space abscess mandible, possible osteomyelitis anterior mandible  POST-OPERATIVE DIAGNOSIS:  SAME+ osteomyelitis anterior mandible  PROCEDURE:  Procedure(s): INCISION AND DRAINAGE SUBMENTAL SUBLINGUAL SPACE INFECTION, DEBRIDEMENT MANDIBLE, MANDIBULAR SEQUESTRECTOMY  SURGEON:  Surgeon(s): Diona Browner, DMD  ANESTHESIA:   local and general  EBL:  minimal  DRAINS: none   SPECIMEN:  No Specimen  COUNTS:  YES  PLAN OF CARE: Discharge to home after PACU  PATIENT DISPOSITION:  PACU - hemodynamically stable.   PROCEDURE DETAILS: Dictation # [id number]  Gae Bon, DMD 10/30/2020 2:40 PM

## 2020-10-31 ENCOUNTER — Encounter (HOSPITAL_COMMUNITY): Payer: Self-pay | Admitting: Oral Surgery

## 2020-10-31 DIAGNOSIS — M272 Inflammatory conditions of jaws: Principal | ICD-10-CM

## 2020-10-31 DIAGNOSIS — Z6833 Body mass index (BMI) 33.0-33.9, adult: Secondary | ICD-10-CM

## 2020-10-31 DIAGNOSIS — E6609 Other obesity due to excess calories: Secondary | ICD-10-CM

## 2020-10-31 LAB — CBC
HCT: 33.8 % — ABNORMAL LOW (ref 36.0–46.0)
Hemoglobin: 10.9 g/dL — ABNORMAL LOW (ref 12.0–15.0)
MCH: 28.2 pg (ref 26.0–34.0)
MCHC: 32.2 g/dL (ref 30.0–36.0)
MCV: 87.6 fL (ref 80.0–100.0)
Platelets: 266 10*3/uL (ref 150–400)
RBC: 3.86 MIL/uL — ABNORMAL LOW (ref 3.87–5.11)
RDW: 14.4 % (ref 11.5–15.5)
WBC: 7.4 10*3/uL (ref 4.0–10.5)
nRBC: 0 % (ref 0.0–0.2)

## 2020-10-31 LAB — BASIC METABOLIC PANEL
Anion gap: 13 (ref 5–15)
BUN: 8 mg/dL (ref 8–23)
CO2: 22 mmol/L (ref 22–32)
Calcium: 8.7 mg/dL — ABNORMAL LOW (ref 8.9–10.3)
Chloride: 105 mmol/L (ref 98–111)
Creatinine, Ser: 0.64 mg/dL (ref 0.44–1.00)
GFR, Estimated: >60 mL/min (ref >60)
Glucose, Bld: 170 mg/dL — ABNORMAL HIGH (ref 70–99)
Potassium: 2.8 mmol/L — ABNORMAL LOW (ref 3.5–5.1)
Sodium: 140 mmol/L (ref 135–145)

## 2020-10-31 MED ORDER — METRONIDAZOLE 500 MG PO TABS: 500.0000 mg | ORAL_TABLET | Freq: Three times a day (TID) | ORAL | 0 refills | Status: DC

## 2020-10-31 MED ORDER — CEFTRIAXONE IV (FOR PTA / DISCHARGE USE ONLY): 2.0000 g | INTRAVENOUS | 0 refills | Status: AC

## 2020-10-31 MED ORDER — POTASSIUM CHLORIDE 20 MEQ PO PACK
40.0000 meq | PACK | ORAL | Status: AC
  Administered 2020-10-31 (×2): 40 meq via ORAL
  Filled 2020-10-31 (×2): qty 2

## 2020-10-31 MED ORDER — METRONIDAZOLE 500 MG PO TABS
500.0000 mg | ORAL_TABLET | Freq: Three times a day (TID) | ORAL | Status: DC
  Administered 2020-10-31 – 2020-11-02 (×6): 500 mg via ORAL
  Filled 2020-10-31 (×6): qty 1

## 2020-10-31 MED ORDER — ADULT MULTIVITAMIN W/MINERALS CH
1.0000 | ORAL_TABLET | Freq: Every day | ORAL | Status: DC
  Administered 2020-10-31 – 2020-11-01 (×2): 1 via ORAL
  Filled 2020-10-31 (×2): qty 1

## 2020-10-31 MED ORDER — ENSURE ENLIVE PO LIQD
237.0000 mL | Freq: Three times a day (TID) | ORAL | Status: DC
  Administered 2020-10-31 – 2020-11-01 (×3): 237 mL via ORAL

## 2020-10-31 NOTE — Plan of Care (Signed)

## 2020-10-31 NOTE — Progress Notes (Signed)
PROGRESS NOTE  [name] HKV:[id number] DOB: January 31, 1951 DOA: 10/30/2020 PCP: Sandi Mariscal, MD   LOS: 1 day   Brief Narrative / Interim history: 69 year old female with history of schizophrenia, hyperlipidemia, reported dementia came into the hospital with jaw pain.  Of note, she recently had ORIF to the right ankle a month ago.  She underwent mandibular arthroplasty and lingual tori removal on 10/12.  She started having pain in her mandible and swelling about a week ago, saw Dr. Wynetta Emery, she underwent a CT scan and the CT showed infective process/osteomyelitis, she was taken to the OR on 12/22.  Subjective / 24h Interval events: She is doing better this morning, less pain and feels more comfortable than yesterday  Assessment & Plan: Principal Problem Mandibular abscess, mandibular osteomyelitis -She is status post incision and drainage by Dr. Hoyt Koch with oral surgery on 12/22.  Cultures were sent intraoperatively and results are pending.  Infectious disease consulted, recommending for now ceftriaxone along with metronidazole and tailor antibiotics based on cultures. -Continue to monitor -She is tolerating clear liquids, if continues to do so will advance to full  Active Problems Recent right ankle fracture -Status post ORIF, has a cast in place, she has outpatient follow-up with orthopedic surgery.  No active issues.  Thyroid nodule -No follow-up needed per radiology  History of schizophrenia, dementia -Patient seems to be alert, appropriate, may have very mild cognitive impairment but is not noticeable.  Continue home medications.  Obesity -She would benefit from weight loss  Hypokalemia -Replete and monitor.  Check magnesium.  Scheduled Meds: . aspirin EC  81 mg Oral BH-q7a  . atorvastatin  40 mg Oral Daily  . buPROPion  300 mg Oral QHS  . citalopram  40 mg Oral Daily  . docusate sodium  100 mg Oral BID  . gabapentin  300 mg Oral QHS  . galantamine  12 mg Oral BID  .  metroNIDAZOLE  500 mg Oral Q8H  . pantoprazole  40 mg Oral BID  . QUEtiapine  600 mg Oral QHS  . ramelteon  8 mg Oral QHS   Continuous Infusions: . cefTRIAXone (ROCEPHIN)  IV 2 g (10/30/20 2118)  . lactated ringers 75 mL/hr at 10/31/20 0906   PRN Meds:.acetaminophen **OR** acetaminophen, bisacodyl, hydrALAZINE, hydrOXYzine, methocarbamol, morphine injection, ondansetron **OR** ondansetron (ZOFRAN) IV, oxyCODONE-acetaminophen, polyethylene glycol  Diet Orders (From admission, onward)    Start     Ordered   10/30/20 1527  Diet full liquid Room service appropriate? Yes; Fluid consistency: Thin  Diet effective now       Comments: Advance diet as tolerated to regular diet  Question Answer Comment  Room service appropriate? Yes   Fluid consistency: Thin      10/30/20 1527         DVT prophylaxis: SCDs Start: 10/30/20 1524    Code Status: DNR  Family Communication: No family at bedside, discussed with patient  Status is: Inpatient  Remains inpatient appropriate because:Inpatient level of care appropriate due to severity of illness   Dispo: The patient is from: Home              Anticipated d/c is to: Home              Anticipated d/c date is: 2 days              Patient currently is not medically stable to d/c.   Consultants:  Oral surgery  ID  Procedures:  I&D mandible  Microbiology  Intraoperative culture-pending  Antimicrobials: Ceftriaxone Metronidazole   Objective: Vitals:   10/30/20 1823 10/30/20 2030 10/31/20 0459 10/31/20 0728  BP: (!) 162/97 (!) 142/86 120/67 122/69  Pulse: 90 94 72 71  Resp: 18 16 17 18   Temp: 98.2 F (36.8 C) 98.3 F (36.8 C) 97.7 F (36.5 C) 97.9 F (36.6 C)  TempSrc: Oral Oral Oral Oral  SpO2: 97% 97% 97% 98%  Weight:      Height:        Intake/Output Summary (Last 24 hours) at 10/31/2020 1008 Last data filed at 10/31/2020 0900 Gross per 24 hour  Intake 2160 ml  Output 25 ml  Net 2135 ml   Filed Weights    10/29/20 0937 10/30/20 1041  Weight: 90.7 kg 90.3 kg    Examination:  Constitutional: NAD Eyes: no scleral icterus ENMT: Mucous membranes are moist.  Mandibular packing in place, dressing CDI Neck: normal, supple Respiratory: clear to auscultation bilaterally, no wheezing, no crackles. Normal respiratory effort.  Cardiovascular: Regular rate and rhythm, no murmurs / rubs / gallops. No LE edema.  Abdomen: non distended, no tenderness. Bowel sounds positive.  Musculoskeletal: no clubbing / cyanosis.  Skin: no rashes Neurologic: CN 2-12 grossly intact. Strength 5/5 in all 4.   Data Reviewed: I have independently reviewed following labs and imaging studies   CBC: Recent Labs  Lab 10/30/20 1039 10/31/20 0113  WBC 7.4 7.4  HGB 12.9 10.9*  HCT 38.3 33.8*  MCV 85.5 87.6  PLT 324 266   Basic Metabolic Panel: Recent Labs  Lab 10/30/20 1925 10/31/20 0113  NA 140 140  K 3.2* 2.8*  CL 104 105  CO2 26 22  GLUCOSE 152* 170*  BUN 8 8  CREATININE 0.68 0.64  CALCIUM 9.1 8.7*   Liver Function Tests: Recent Labs  Lab 10/30/20 1925  AST 19  ALT 18  ALKPHOS 99  BILITOT 0.5  PROT 6.5  ALBUMIN 3.1*   Coagulation Profile: No results for input(s): INR, PROTIME in the last 168 hours. HbA1C: No results for input(s): HGBA1C in the last 72 hours. CBG: No results for input(s): GLUCAP in the last 168 hours.  Recent Results (from the past 240 hour(s))  SARS Coronavirus 2 by RT PCR (hospital order, performed in [facility] hospital lab) Nasopharyngeal Nasopharyngeal Swab     Status: None   Collection Time: 10/30/20 10:29 AM   Specimen: Nasopharyngeal Swab  Result Value Ref Range Status   SARS Coronavirus 2 NEGATIVE NEGATIVE Final    Comment: (NOTE) SARS-CoV-2 target nucleic acids are NOT DETECTED.  The SARS-CoV-2 RNA is generally detectable in upper and lower respiratory specimens during the acute phase of infection. The lowest concentration of SARS-CoV-2 viral copies this  assay can detect is 250 copies / mL. A negative result does not preclude SARS-CoV-2 infection and should not be used as the sole basis for treatment or other patient management decisions.  A negative result may occur with improper specimen collection / handling, submission of specimen other than nasopharyngeal swab, presence of viral mutation(s) within the areas targeted by this assay, and inadequate number of viral copies (<250 copies / mL). A negative result must be combined with clinical observations, patient history, and epidemiological information.  Fact Sheet for Patients:   11/01/20  Fact Sheet for Healthcare Providers: BoilerBrush.com.cy  This test is not yet approved or  cleared by the https://pope.com/ FDA and has been authorized for detection and/or diagnosis of SARS-CoV-2 by FDA under an Emergency  Use Authorization (EUA).  This EUA will remain in effect (meaning this test can be used) for the duration of the COVID-19 declaration under Section 564(b)(1) of the Act, 21 U.S.C. section 360bbb-3(b)(1), unless the authorization is terminated or revoked sooner.  Performed at [facility], [address], [place], [place] [address]   Aerobic/Anaerobic Culture (surgical/deep wound)     Status: None (Preliminary result)   Collection Time: 10/30/20  2:09 PM   Specimen: PATH Bone resection; Tissue  Result Value Ref Range Status   Specimen Description ABSCESS  Final   Special Requests SUBLINGUAL  Final   Gram Stain   Final    ABUNDANT WBC PRESENT, PREDOMINANTLY PMN NO ORGANISMS SEEN Performed at [facility], [address], [place], [place] [address]    Culture PENDING  Incomplete   Report Status PENDING  Incomplete  Aerobic/Anaerobic Culture (surgical/deep wound)     Status: None (Preliminary result)   Collection Time: 10/30/20  2:23 PM   Specimen: PATH Other; Tissue  Result Value Ref Range Status   Specimen  Description BONE  Final   Special Requests MANDIBLE  Final   Gram Stain   Final    NO WBC SEEN NO ORGANISMS SEEN Performed at [facility], [address], [place], [place] [address]    Culture PENDING  Incomplete   Report Status PENDING  Incomplete     Radiology Studies: DG Orthopantogram  Result Date: 10/30/2020 CLINICAL DATA:  Preoperative assessment EXAM: ORTHOPANTOGRAM/PANORAMIC COMPARISON:  None. FINDINGS: Patient is edentulous. Soft tissue prominence noted inferiorly at the level of the midline mandible at the symphysis level. No air or calcification within this soft tissue prominence. No abnormal periosteal reaction. No fracture or dislocation. Small lucencies in the left and right mandibular bodies medially may represent localized areas of osteoporosis. Maxillary antra appear clear. IMPRESSION: Soft tissue masslike area along the inferior and deep volar symphysis measuring 5.7 x 1.8 cm. No associated air in this area. No bony erosion. Suspect areas of osteoporosis in the medial body of each mandible. No frank bony destruction evident. Patient edentulous. Suspect areas of osteoporosis in the medial aspect of each mandibular body. Electronically Signed   By: Bretta Bang III M.D.   On: 10/30/2020 11:25   CT SOFT TISSUE NECK W CONTRAST  Result Date: 10/30/2020 CLINICAL DATA:  Sublingual submental abscess. Question osteomyelitis of the mandible. EXAM: CT NECK WITH CONTRAST TECHNIQUE: Multidetector CT imaging of the neck was performed using the standard protocol following the bolus administration of intravenous contrast. CONTRAST:  71mL OMNIPAQUE IOHEXOL 300 MG/ML  SOLN COMPARISON:  Panorex same day.  Mandibular radiographs 10/01/2020. FINDINGS: Pharynx and larynx: No mucosal or submucosal lesion. In the sublingual space on the left, there is a well-circumscribed 9 x 14 mm density which could be a lymph node or a sublingual mass. Salivary glands: Parotid glands are normal.  Submandibular glands are normal. Thyroid: 12 mm thyroid nodule of the right thyroid lobe. Lymph nodes: No other lymph nodes appear abnormal. Vascular: Minimal atherosclerosis of the carotid bifurcations. Limited intracranial: Normal Visualized orbits: Normal Mastoids and visualized paranasal sinuses: Clear Skeleton: No evidence of regional fracture. The patient does have an abnormal appearance of the mandibular symphysis, with irregular lucency worrisome for avascular necrosis and or osteomyelitis, with cloaca formation. Upper chest: Normal Other: Soft tissue inflammatory change just inferior to the symphysis of the mandible measuring about 1.5 cm in size consistent with local phlegmonous inflammation. No evidence of a  drainable low-density collection. IMPRESSION: 1. Soft tissue inflammatory change just inferior to the symphysis of the mandible measuring about 1.5 cm in size consistent with local phlegmonous inflammation. No evidence of a drainable low-density collection. 2. Abnormal appearance of the mandibular symphysis, with irregular lucency worrisome for avascular necrosis or, more likely, osteomyelitis, with cloaca formation. The entire segment of bone between the right and the left mental foramina appears to be involved. 3. 9 x 14 mm well-circumscribed density in the sublingual space on the left which could be a lymph node or a sublingual mass. 4. 12 mm thyroid nodule of the right thyroid lobe. No follow-up recommended. No followup recommended (ref: J Am Coll Radiol. 2015 Feb;12(2): 143-50). Electronically Signed   By: Nelson Chimes M.D.   On: 10/30/2020 12:59    Marzetta Board, MD, PhD [facility]  Between 7 am - 7 pm I am available, please contact me via Amion or Securechat  Between 7 pm - 7 am I am not available, please contact night coverage MD/APP via Amion

## 2020-10-31 NOTE — Op Note (Signed)
NAME: [name], [name] MEDICAL RECORD [id number] ACCOUNT [id number] DATE OF BIRTH:July 03, 1951 FACILITY: [facility] LOCATION: [facility] PHYSICIAN:Shamanda Len M. Shafin Pollio, DDS  OPERATIVE REPORT  DATE OF PROCEDURE:  10/30/2020  PREOPERATIVE DIAGNOSIS:  Submental sublingual space abscess of the mandible, possible osteomyelitis anterior mandible.  POSTOPERATIVE DIAGNOSIS:  Submental sublingual space abscess of the mandible, possible osteomyelitis anterior mandible, osteomyelitis anterior mandible.  PROCEDURE:  Incision and drainage submental sublingual space infection, debridement mandible, mandibular sequestrectomy.  SURGEON:  Diona Browner, DDS  ANESTHESIA:  Nasal intubation, Dr. Conrad Thomaston attending.  DESCRIPTION OF PROCEDURE:  The patient was taken to the operating room and placed on the table in supine position.  General anesthesia was administered and nasal endotracheal tube was placed and secured.  The eyes were protected and the patient was  draped and prepped for surgery.  A timeout was performed.  Local anesthesia was administered in the submental region where there was pointed induration.  A total of 30 mL administered.  Then, a 15 blade used to make an incision through the skin  approximately 1 cm long.  Blunt dissection superiorly towards the mandible produced some purulent exudate, which was taken for aerobic and anaerobic cultures and Gram stains.  Then, the oral cavity was examined.  The posterior pharynx was suctioned and a  throat pack was placed, 2% lidocaine 1:100,000 epinephrine was infiltrated in an inferior alveolar block on the right and left sides and in buccal infiltration in the anterior mandible, a 15 blade used to make an incision beginning approximately the  level of the first molar carried out towards the midline and brought around to the right side to the canine area on the alveolar crest.  The periosteum was reflected lingually and there was some necrotic bone noted, and a  horizontal cleft in the bone indicating a sequestrum or fracture.  The necrotic bone was curetted with the dental curette.   The rongeur was used to remove some of the cortical bone to access the necrotic bone in medullary region.  This was done in the lingual aspect of the anterior mandible as well as along the left lingual aspect of the mandible.  Bone was taken for culture and for pathology  specimen.  Then, the incision was lengthened on the right alveolar crest in the mandible to the molar area and this area was examined and it did not appear to have necrotic bone.  The Stryker handpiece with an egg-shaped bur was used to reduce the  contour of the bone until some bleeding bone was identified in the midline and left premolar and molar areas and it was smoothed to avoid undercuts and concavities.  The bone file was also used to smooth the bone.  Then, the intraoral area was irrigated after the  necrotic bone was removed and closed with 3-0 chromic. a quarter inch Penrose was placed through the submental incision to the inferior border of the mandible on the right and left sides and was sutured to the skin with 3-0 nylon.  The oral cavity was  then irrigated and suctioned.  Throat pack was removed.  Sterile dressing was applied to the anterior drain under the chin and the patient was left in care of anesthesia for extubation was transported to recovery room.  ESTIMATED BLOOD LOSS:  Minimal.  COMPLICATIONS:  None.  SPECIMENS:  Necrotic bone of mandible.    CULTURES:  Aerobic and anaerobic and Gram stain.  HN/NUANCE  D:10/30/2020 T:10/31/2020 JOB:[id number]

## 2020-10-31 NOTE — Progress Notes (Signed)
[name] PROGRESS NOTE:   SUBJECTIVE: Feeling better. Mild pain. Tolerating PO's  OBJECTIVE:  Vitals: Blood pressure 122/69, pulse 71, temperature 97.9 F (36.6 C), temperature source Oral, resp. rate 18, height 5\' 5"  (1.651 m), weight 90.3 kg, SpO2 98 %. Lab results: Results for orders placed or performed during the hospital encounter of 10/30/20 (from the past 24 hour(s))  Aerobic/Anaerobic Culture (surgical/deep wound)     Status: None (Preliminary result)   Collection Time: 10/30/20  2:09 PM   Specimen: PATH Bone resection; Tissue  Result Value Ref Range   Specimen Description ABSCESS    Special Requests SUBLINGUAL    Gram Stain      ABUNDANT WBC PRESENT, PREDOMINANTLY PMN NO ORGANISMS SEEN    Culture      NO GROWTH < 24 HOURS Performed at [facility] [address], [place], [place] [address]    Report Status PENDING   Aerobic/Anaerobic Culture (surgical/deep wound)     Status: None (Preliminary result)   Collection Time: 10/30/20  2:23 PM   Specimen: PATH Other; Tissue  Result Value Ref Range   Specimen Description BONE    Special Requests MANDIBLE    Gram Stain NO WBC SEEN NO ORGANISMS SEEN     Culture      CULTURE REINCUBATED FOR BETTER GROWTH Performed at [facility], [place] [address], [place], [place] [address]    Report Status PENDING   Comprehensive metabolic panel     Status: Abnormal   Collection Time: 10/30/20  7:25 PM  Result Value Ref Range   Sodium 140 135 - 145 mmol/L   Potassium 3.2 (L) 3.5 - 5.1 mmol/L   Chloride 104 98 - 111 mmol/L   CO2 26 22 - 32 mmol/L   Glucose, Bld 152 (H) 70 - 99 mg/dL   BUN 8 8 - 23 mg/dL   Creatinine, Ser 0.68 0.44 - 1.00 mg/dL   Calcium 9.1 8.9 - 10.3 mg/dL   Total Protein 6.5 6.5 - 8.1 g/dL   Albumin 3.1 (L) 3.5 - 5.0 g/dL   AST 19 15 - 41 U/L   ALT 18 0 - 44 U/L   Alkaline Phosphatase 99 38 - 126 U/L   Total Bilirubin 0.5 0.3 - 1.2 mg/dL   GFR, Estimated >60 >60 mL/min   Anion gap 10 5 -  15  Basic metabolic panel     Status: Abnormal   Collection Time: 10/31/20  1:13 AM  Result Value Ref Range   Sodium 140 135 - 145 mmol/L   Potassium 2.8 (L) 3.5 - 5.1 mmol/L   Chloride 105 98 - 111 mmol/L   CO2 22 22 - 32 mmol/L   Glucose, Bld 170 (H) 70 - 99 mg/dL   BUN 8 8 - 23 mg/dL   Creatinine, Ser 0.64 0.44 - 1.00 mg/dL   Calcium 8.7 (L) 8.9 - 10.3 mg/dL   GFR, Estimated >60 >60 mL/min   Anion gap 13 5 - 15  CBC     Status: Abnormal   Collection Time: 10/31/20  1:13 AM  Result Value Ref Range   WBC 7.4 4.0 - 10.5 K/uL   RBC 3.86 (L) 3.87 - 5.11 MIL/uL   Hemoglobin 10.9 (L) 12.0 - 15.0 g/dL   HCT 33.8 (L) 36.0 - 46.0 %   MCV 87.6 80.0 - 100.0 fL   MCH 28.2 26.0 - 34.0 pg   MCHC 32.2 30.0 - 36.0 g/dL   RDW 14.4 11.5 -  15.5 %   Platelets 266 150 - 400 K/uL   nRBC 0.0 0.0 - 0.2 %   Radiology Results: DG Orthopantogram  Result Date: 10/30/2020 CLINICAL DATA:  Preoperative assessment EXAM: ORTHOPANTOGRAM/PANORAMIC COMPARISON:  None. FINDINGS: Patient is edentulous. Soft tissue prominence noted inferiorly at the level of the midline mandible at the symphysis level. No air or calcification within this soft tissue prominence. No abnormal periosteal reaction. No fracture or dislocation. Small lucencies in the left and right mandibular bodies medially may represent localized areas of osteoporosis. Maxillary antra appear clear. IMPRESSION: Soft tissue masslike area along the inferior and deep volar symphysis measuring 5.7 x 1.8 cm. No associated air in this area. No bony erosion. Suspect areas of osteoporosis in the medial body of each mandible. No frank bony destruction evident. Patient edentulous. Suspect areas of osteoporosis in the medial aspect of each mandibular body. Electronically Signed   By: Lowella Grip III M.D.   On: 10/30/2020 11:25   CT SOFT TISSUE NECK W CONTRAST  Result Date: 10/30/2020 CLINICAL DATA:  Sublingual submental abscess. Question osteomyelitis of the  mandible. EXAM: CT NECK WITH CONTRAST TECHNIQUE: Multidetector CT imaging of the neck was performed using the standard protocol following the bolus administration of intravenous contrast. CONTRAST:  47mL OMNIPAQUE IOHEXOL 300 MG/ML  SOLN COMPARISON:  Panorex same day.  Mandibular radiographs 10/01/2020. FINDINGS: Pharynx and larynx: No mucosal or submucosal lesion. In the sublingual space on the left, there is a well-circumscribed 9 x 14 mm density which could be a lymph node or a sublingual mass. Salivary glands: Parotid glands are normal. Submandibular glands are normal. Thyroid: 12 mm thyroid nodule of the right thyroid lobe. Lymph nodes: No other lymph nodes appear abnormal. Vascular: Minimal atherosclerosis of the carotid bifurcations. Limited intracranial: Normal Visualized orbits: Normal Mastoids and visualized paranasal sinuses: Clear Skeleton: No evidence of regional fracture. The patient does have an abnormal appearance of the mandibular symphysis, with irregular lucency worrisome for avascular necrosis and or osteomyelitis, with cloaca formation. Upper chest: Normal Other: Soft tissue inflammatory change just inferior to the symphysis of the mandible measuring about 1.5 cm in size consistent with local phlegmonous inflammation. No evidence of a drainable low-density collection. IMPRESSION: 1. Soft tissue inflammatory change just inferior to the symphysis of the mandible measuring about 1.5 cm in size consistent with local phlegmonous inflammation. No evidence of a drainable low-density collection. 2. Abnormal appearance of the mandibular symphysis, with irregular lucency worrisome for avascular necrosis or, more likely, osteomyelitis, with cloaca formation. The entire segment of bone between the right and the left mental foramina appears to be involved. 3. 9 x 14 mm well-circumscribed density in the sublingual space on the left which could be a lymph node or a sublingual mass. 4. 12 mm thyroid nodule of  the right thyroid lobe. No follow-up recommended. No followup recommended (ref: J Am Coll Radiol. 2015 Feb;12(2): 143-50). Electronically Signed   By: Nelson Chimes M.D.   On: 10/30/2020 12:59   General appearance: alert, cooperative and no distress Head: Normocephalic, without obvious abnormality, atraumatic Eyes: negative Nose: Nares normal. Septum midline. Mucosa normal. No drainage or sinus tenderness. Throat: Mild oral edema. Sutures intact, hemostatic. Pharynx clear. Neck: Minimal drainage submental. Moderate submental edema.  ASSESSMENT: Stable s/p Submental I and D, mandibular debridement and sequestrectomy.  PLAN: Continue IV antibiotics. Encourage PO intake. Submental drain removed bedside. Will follow.   Diona Browner 10/31/2020

## 2020-10-31 NOTE — Consult Note (Addendum)
[facility] for Infectious Diseases                                                                                        Patient Identification: Patient Name: [name] MRN: [id number] Admit Date: 10/30/2020 10:20 AM Today's Date: 10/31/2020 Reason for consult: Mandibular Osteomyelitis   Principal Problem:   Acute osteomyelitis of mandible Active Problems:   Schizophrenia ([place])   Hypercholesteremia   Alzheimer's dementia ([place])   Chronic pain disorder   Class 1 obesity due to excess calories with body mass index (BMI) of 33.0 to 33.9 in adult   Antibiotics: Ceftriaxone Day 2 and Metronidazole Day 2   Assessment Mandibular osteomyelitis  Mandibular phlegmon S/p I and D on 10/30/20. OR cx pending. No organisms in gram stain   Recommendations  Continue ceftriaxone and metronidazole as is. Unasyn might be difficult OP due to frequent dosing and concerns with stability Baseline CRP and ESR( ordered) with weekly ESR and CRP thereafter Monitor CBC and CMP on IV abx PICC line Will need 6 weeks of IV abx from date of last OR Will follow cultures and adjust antibiotics accordingly. No need to be inpatient from ID standpoint.   Rest of the management as per the primary team. Please call with questions or concerns.  Thank you for the consult  _______________________________________________________________________________________________________ HPI and Hospital Course: [name] is a 69 Y O female with a recent h/o R ankle bimalleolar fx a month ago s/p ORIF;  alzheimer's dementia, anemia, carpal tunnel, depression, GERD, hypercholesteremia, prolonged Q-T interval, schizophrenia admitted on 12/21 for jaw pain and swelling 1 week ago prior to admit. Of note, she has underwent mandibular alveoloplasty and lingual tori removal  on 08/20/2020 and had a uneventful post operative course until pt began to have  pain and swelling approximately 1 week ago. She saw Dr. Hoyt Koch and was scheduled for I&D of sublingual/submental space abscess of the mandible. CT pre-operatively also showed findings concerning for osteomyelitis.    Patient underwent Incision and drainage submental sublingual space infection, debridement mandible, mandibular sequestrectomy on 12/22. OR cultures have been sent. Patient has been started on ceftriaxone and metronidazole.   At ED, she was afebrile, no leukocytosis with unremarkable CBC and CMP. Patient says she had low grade fever at home and was also given Augementin for 1 week prior to admit which she has not picked up from pharmacy. She was not able to take anything PO and has nausea. But denies vomiting, abdominal pain and diarrhea.   She is able to tell me the year, month, day, current location and her history prior to admit.   ROS: General- Denies fever,  loss of weight, Fever+, NAUSEA+, LOSS OF APPETITE+ HEENT - Denies headache, blurry vision, neck pain, sinus pain Chest - Denies any chest pain, SOB or cough CVS- Denies any dizziness/lightheadedness, syncopal attacks, palpitations Abdomen- Denies any nausea, vomiting, abdominal pain, hematochezia and diarrhea Neuro - Denies any weakness, numbness, tingling sensation Psych - Denies any changes in mood irritability or depressive symptoms GU- Denies any burning, dysuria, hematuria or increased frequency of urination Skin - denies any rashes/lesions MSK -  denies any joint pain/swelling or restricted ROM   Past Medical History:  Diagnosis Date   Alzheimer's dementia ([place])    Anemia    Carpal tunnel syndrome, bilateral    Chronic pain disorder 10/30/2020   Class 1 obesity due to excess calories with body mass index (BMI) of 33.0 to 33.9 in adult 10/30/2020   Depression    GERD (gastroesophageal reflux disease)    Hypercholesteremia    Prolonged Q-T interval on ECG 06/10/2020   Schizophrenia ([place])    Past  Surgical History:  Procedure Laterality Date   ABDOMINAL HYSTERECTOMY     ALVEOLOPLASTY Bilateral 08/20/2020   Procedure: ALVEOLOPLASTY;  Surgeon: Diona Browner, DDS;  Location: [facility];  Service: Oral Surgery;  Laterality: Bilateral;   CARPAL TUNNEL RELEASE     COLONOSCOPY     INCISION AND DRAINAGE ABSCESS N/A 10/30/2020   Procedure: INCISION AND DRAINAGE SUBMENTAL SUBLINGUAL SPACE INFECTION, DEBRIDEMENT MANDIBLE;  Surgeon: Diona Browner, DMD;  Location: [facility];  Service: Oral Surgery;  Laterality: N/A;   MANDIBLE SURGERY     ORIF ANKLE FRACTURE Right 10/01/2020   Procedure: OPEN REDUCTION INTERNAL FIXATION (ORIF) ANKLE FRACTURE;  Surgeon: Nicholes Stairs, MD;  Location: [facility];  Service: Orthopedics;  Laterality: Right;   TUMOR REMOVAL     on back    Scheduled Meds:  aspirin EC  81 mg Oral BH-q7a   atorvastatin  40 mg Oral Daily   buPROPion  300 mg Oral QHS   citalopram  40 mg Oral Daily   docusate sodium  100 mg Oral BID   gabapentin  300 mg Oral QHS   galantamine  12 mg Oral BID   pantoprazole  40 mg Oral BID   potassium chloride  40 mEq Oral Q3H   QUEtiapine  600 mg Oral QHS   ramelteon  8 mg Oral QHS   Continuous Infusions:  cefTRIAXone (ROCEPHIN)  IV 2 g (10/30/20 2118)   lactated ringers     metronidazole 500 mg (10/31/20 0614)   PRN Meds:.acetaminophen **OR** acetaminophen, bisacodyl, hydrALAZINE, hydrOXYzine, methocarbamol, morphine injection, ondansetron **OR** ondansetron (ZOFRAN) IV, oxyCODONE-acetaminophen, polyethylene glycol  Allergies  Allergen Reactions   Donepezil Other (See Comments)    Other reaction(s): CHEST PAINS & THRUSH,PALPITATIONS  Pt denies   Estrogens Other (See Comments)    Other reaction(s): HRT-DVT Patient un aware    Social History   Socioeconomic History   Marital status: Single    Spouse name: Not on file   Number of children: Not on file   Years of education: Not on file    Highest education level: Not on file  Occupational History   Not on file  Tobacco Use   Smoking status: Never Smoker   Smokeless tobacco: Never Used  Vaping Use   Vaping Use: Never used  Substance and Sexual Activity   Alcohol use: No   Drug use: No   Sexual activity: Not on file  Other Topics Concern   Not on file  Social History Narrative   Not on file   Social Determinants of Health   Financial Resource Strain: Not on file  Food Insecurity: Not on file  Transportation Needs: Not on file  Physical Activity: Not on file  Stress: Not on file  Social Connections: Not on file  Intimate Partner Violence: Not on file      Vitals BP 122/69 (BP Location: Right Arm)    Pulse 71    Temp 97.9 F (36.6 C) (  Oral)    Resp 18    Ht _0  (1.651 m)    Wt 90.3 kg    SpO2 98%    BMI 33.12 kg/m    Physical Exam Constitutional:      Comments:  Elderly white female lying in bed. Not in acute distress, has a bandage in her inferior jaw. Mild swelling+  Cardiovascular:     Rate and Rhythm: Normal rate and regular rhythm.     Heart sounds: SYSTOLIC MURMUR+ GRADE 3  Pulmonary:     Effort: Pulmonary effort is normal.     Comments:   Abdominal:     Palpations: Abdomen is soft.     Tenderness:   Musculoskeletal:        General: rt leg has a cast and able to wriggle rt toes   Skin:    Comments: no obvious lesions or rashes   Neurological:     General: No focal deficit present.   Psychiatric:        Mood and Affect: Mood normal.    LINES/TUBES:  METAL IMPLANT/HARDWARE: RT Ankle ORIF   Pertinent Microbiology Results for orders placed or performed during the hospital encounter of 10/30/20  SARS Coronavirus 2 by RT PCR (hospital order, performed in [facility] hospital lab) Nasopharyngeal Nasopharyngeal Swab     Status: None   Collection Time: 10/30/20 10:29 AM   Specimen: Nasopharyngeal Swab  Result Value Ref Range Status   SARS Coronavirus 2 NEGATIVE NEGATIVE  Final    Comment: (NOTE) SARS-CoV-2 target nucleic acids are NOT DETECTED.  The SARS-CoV-2 RNA is generally detectable in upper and lower respiratory specimens during the acute phase of infection. The lowest concentration of SARS-CoV-2 viral copies this assay can detect is 250 copies / mL. A negative result does not preclude SARS-CoV-2 infection and should not be used as the sole basis for treatment or other patient management decisions.  A negative result may occur with improper specimen collection / handling, submission of specimen other than nasopharyngeal swab, presence of viral mutation(s) within the areas targeted by this assay, and inadequate number of viral copies (<250 copies / mL). A negative result must be combined with clinical observations, patient history, and epidemiological information.  Fact Sheet for Patients:   StrictlyIdeas.no  Fact Sheet for Healthcare Providers: BankingDealers.co.za  This test is not yet approved or  cleared by the [place] FDA and has been authorized for detection and/or diagnosis of SARS-CoV-2 by FDA under an Emergency Use Authorization (EUA).  This EUA will remain in effect (meaning this test can be used) for the duration of the COVID-19 declaration under Section 564(b)(1) of the Act, 21 U.S.C. section 360bbb-3(b)(1), unless the authorization is terminated or revoked sooner.  Performed at [facility], [place] [address] [place], [place] [address]   Aerobic/Anaerobic Culture (surgical/deep wound)     Status: None (Preliminary result)   Collection Time: 10/30/20  2:09 PM   Specimen: PATH Bone resection; Tissue  Result Value Ref Range Status   Specimen Description ABSCESS  Final   Special Requests SUBLINGUAL  Final   Gram Stain   Final    ABUNDANT WBC PRESENT, PREDOMINANTLY PMN NO ORGANISMS SEEN Performed at [facility], [address] [place], [place] [address]    Culture  PENDING  Incomplete   Report Status PENDING  Incomplete  Aerobic/Anaerobic Culture (surgical/deep wound)     Status: None (Preliminary result)   Collection Time: 10/30/20  2:23 PM  Specimen: PATH Other; Tissue  Result Value Ref Range Status   Specimen Description BONE  Final   Special Requests MANDIBLE  Final   Gram Stain   Final    NO WBC SEEN NO ORGANISMS SEEN Performed at [facility], [address], [place], [place] [address]    Culture PENDING  Incomplete   Report Status PENDING  Incomplete    Pertinent Lab seen by me: CBC Latest Ref Rng & Units 10/31/2020 10/30/2020 10/02/2020  WBC 4.0 - 10.5 K/uL 7.4 7.4 9.8  Hemoglobin 12.0 - 15.0 g/dL 10.9(L) 12.9 11.9(L)  Hematocrit 36.0 - 46.0 % 33.8(L) 38.3 36.7  Platelets 150 - 400 K/uL 266 324 286   CMP Latest Ref Rng & Units 10/31/2020 10/30/2020 10/02/2020  Glucose 70 - 99 mg/dL 170(H) 152(H) -  BUN 8 - 23 mg/dL 8 8 -  Creatinine 0.44 - 1.00 mg/dL 0.64 0.68 0.88  Sodium 135 - 145 mmol/L 140 140 -  Potassium 3.5 - 5.1 mmol/L 2.8(L) 3.2(L) -  Chloride 98 - 111 mmol/L 105 104 -  CO2 22 - 32 mmol/L 22 26 -  Calcium 8.9 - 10.3 mg/dL 8.7(L) 9.1 -  Total Protein 6.5 - 8.1 g/dL - 6.5 -  Total Bilirubin 0.3 - 1.2 mg/dL - 0.5 -  Alkaline Phos 38 - 126 U/L - 99 -  AST 15 - 41 U/L - 19 -  ALT 0 - 44 U/L - 18 -     Pertinent Imagings/Other Imagings Plain films and CT images have been personally visualized and interpreted; radiology reports have been reviewed. Decision making incorporated into the Impression / Recommendations.   CT Soft tissue neck 10/30/20  FINDINGS: Pharynx and larynx: No mucosal or submucosal lesion. In the sublingual space on the left, there is a well-circumscribed 9 x 14 mm density which could be a lymph node or a sublingual mass.  Salivary glands: Parotid glands are normal. Submandibular glands are normal.  Thyroid: 12 mm thyroid nodule of the right thyroid lobe.  Lymph nodes: No other lymph  nodes appear abnormal.  Vascular: Minimal atherosclerosis of the carotid bifurcations.  Limited intracranial: Normal  Visualized orbits: Normal  Mastoids and visualized paranasal sinuses: Clear  Skeleton: No evidence of regional fracture. The patient does have an abnormal appearance of the mandibular symphysis, with irregular lucency worrisome for avascular necrosis and or osteomyelitis, with cloaca formation.  Upper chest: Normal  Other: Soft tissue inflammatory change just inferior to the symphysis of the mandible measuring about 1.5 cm in size consistent with local phlegmonous inflammation. No evidence of a drainable low-density collection.  IMPRESSION: 1. Soft tissue inflammatory change just inferior to the symphysis of the mandible measuring about 1.5 cm in size consistent with local phlegmonous inflammation. No evidence of a drainable low-density collection. 2. Abnormal appearance of the mandibular symphysis, with irregular lucency worrisome for avascular necrosis or, more likely, osteomyelitis, with cloaca formation. The entire segment of bone between the right and the left mental foramina appears to be involved. 3. 9 x 14 mm well-circumscribed density in the sublingual space on the left which could be a lymph node or a sublingual mass. 4. 12 mm thyroid nodule of the right thyroid lobe. No follow-up recommended. No followup recommended (ref: J Am Coll Radiol. 2015 Feb;12(2): 143-50).   10/30/20 Orthopantogram  FINDINGS: Patient is edentulous. Soft tissue prominence noted inferiorly at the level of the midline mandible at the symphysis level. No air or calcification within this soft tissue prominence.  No abnormal periosteal reaction. No fracture or dislocation. Small lucencies in the left and right mandibular bodies medially may represent localized areas of osteoporosis. Maxillary antra appear clear.  IMPRESSION: Soft tissue masslike area along the  inferior and deep volar symphysis measuring 5.7 x 1.8 cm. No associated air in this area. No bony erosion. Suspect areas of osteoporosis in the medial body of each mandible. No frank bony destruction evident. Patient edentulous. Suspect areas of osteoporosis in the medial aspect of each mandibular body.   I have spent greater than 45 minutes for this patient encounter including review of prior medical records with greater than 50% of time being face to face and coordination of their care.  Electronically signed by:   Rosiland Oz, MD Infectious Disease Physician [facility] for Infectious Disease Pager: [phone]

## 2020-10-31 NOTE — Progress Notes (Signed)
PHARMACY CONSULT NOTE FOR:  OUTPATIENT  PARENTERAL ANTIBIOTIC THERAPY (OPAT)  Indication: Jaw osteomyelitis Regimen: Ceftriaxone 2 gm IV Q 24 hours + Metronidazole 500 mg PO q 8 hours End date: 12/11/2020  IV antibiotic discharge orders are pended. To discharging provider:  please sign these orders via discharge navigator,  Select New Orders & click on the button choice - Manage This Unsigned Work.     Thank you for allowing pharmacy to be a part of this patient's care.  Jimmy Footman, PharmD, BCPS, BCIDP Infectious Diseases Clinical Pharmacist Phone: [phone] 10/31/2020, 9:58 AM

## 2020-10-31 NOTE — Progress Notes (Signed)
Initial Nutrition Assessment  DOCUMENTATION CODES:   Obesity unspecified  INTERVENTION:   -Ensure Enlive po TID, each supplement provides 350 kcal and 20 grams of protein -MVI with minerals daily  NUTRITION DIAGNOSIS:   Increased nutrient needs related to post-op healing as evidenced by estimated needs.  GOAL:   Patient will meet greater than or equal to 90% of their needs  MONITOR:   PO intake,Supplement acceptance,Diet advancement,Labs,Weight trends,Skin,I & O's  REASON FOR ASSESSMENT:   Consult Assessment of nutrition requirement/status  ASSESSMENT:   [name] is a 69 y.o. female with medical history significant of schizophrenia; HLD; and dementia presenting with jaw pain.  Interestingly, she as admitted for R ankle bimalleolar fracture 1 month ago with ORIF; she was discharged to home in the care of her son.  She underwent mandibular alveoloplasty and lingual tori removal on 10/12.  She started having pain and swelling about 1 week ago.  She saw Dr. Hoyt Koch yesterday and was scheduled for I&D of sublingual/submental space abscess of the mandible.  However, she was also ordered to have CT pre-operatively and that CT showed osteomyelitis.  As such, rather than same day surgery she is now in need of admission for pain control, IV antibiotics, and ID consultation.  At the time of my evaluation, she was having significant jaw pain and was very uncomfortable in PACU.  Pt admitted with mandibular osteomyelitis.   12/22- s/p PROCEDURE:  Incision and drainage submental sublingual space infection, debridement mandible, mandibular sequestrectomy  Reviewed I/O's: +1.8 L x 24 hours  Spoke with pt at bedside, who reports feeling better today. She shares that her mouth "feels so much better". Pt endorses progressive decreased oral intake over the past 3 weeks secondary to jaw pain and painful swallowing. Diet consisted mainly of soups, jello, milk, and sodas. Prior to jaw pain, pt  reports great appetite, consuming 3 meals per day.   Pt reports good tolerance of full liquid diet. Noted meal completion 100%. Observed breakfast and lunch trays- pt consumed 100%.   Pt reports UBW is around 195-200#. She does not suspect she has lost weight. Reviewed wt hx; wt has been stable over the past year.   Discussed full liquid diet with pt as well as possible progression to soft solids to help alleviate pain. Pt amenable to plan and is amenable to protein shakes to help meet nutritional goals.   Medications reviewed and include lactated ringers infusion @ 75 ml/hr.   Labs reviewed: K: 2.8.   NUTRITION - FOCUSED PHYSICAL EXAM:  Flowsheet Row Most Recent Value  Orbital Region No depletion  Upper Arm Region No depletion  Thoracic and Lumbar Region No depletion  Buccal Region No depletion  Temple Region No depletion  Clavicle Bone Region No depletion  Clavicle and Acromion Bone Region No depletion  Scapular Bone Region No depletion  Dorsal Hand No depletion  Patellar Region No depletion  Anterior Thigh Region No depletion  Posterior Calf Region No depletion  Edema (RD Assessment) None  Hair Reviewed  Eyes Reviewed  Mouth Reviewed  Skin Reviewed  Nails Reviewed       Diet Order:   Diet Order            Diet full liquid Room service appropriate? Yes; Fluid consistency: Thin  Diet effective now                 EDUCATION NEEDS:   Education needs have been addressed  Skin:  Skin Assessment: Skin  Integrity Issues: Skin Integrity Issues:: Incisions Incisions: closed face  Last BM:  10/29/20  Height:   Ht Readings from Last 1 Encounters:  10/30/20 5\' 5"  (1.651 m)    Weight:   Wt Readings from Last 1 Encounters:  10/30/20 90.3 kg    Ideal Body Weight:  56.8 kg  BMI:  Body mass index is 33.12 kg/m.  Estimated Nutritional Needs:   Kcal:  [id number]  Protein:  85-100 grams  Fluid:  > 1.7 L    Loistine Chance, RD, LDN, [place] Registered Dietitian  II Certified Diabetes Care and Education Specialist Please refer to [facility] for RD and/or RD on-call/weekend/after hours pager

## 2020-10-31 NOTE — TOC Initial Note (Signed)
Transition of Care [facility]) - Initial/Assessment Note    Patient Details  Name: [name] MRN: [id number] Date of Birth: October 11, 1951  [facility] of [facility]) CM/SW Contact:    Sharin Mons, RN Phone Number: 10/31/2020, 10:30 AM  Clinical Narrative:                  Readmitted with Mandibular abscess, mandibular osteomyelitis. Previous hospitalization: mandibular arthroplasty and lingual tori removal on 10/12.     - s/p INCISION AND DRAINAGE SUBMENTAL SUBLINGUAL SPACE INFECTION, DEBRIDEMENT MANDIBLE, MANDIBULAR SEQUESTRECTOMY, 10/30/2020   From home with son. Pt states independent with ADL's, no DME usage. Per  ID pt will need 6 weeks of IV ABX therapy. Pt agreeable to home health services. Pt without preference. Referral made with Jeannene Patella / [facility] and [facility] ( RN), both accepted. Teaching done per Pam for IV ABX therapy. Pt states son to assist if needed.  TOC team will continue to monitor for needs. Per MD plan to d/c on tomorrow.  Expected Discharge Plan: [place] Barriers to Discharge: Continued Medical Work up   Patient Goals and CMS Choice     Choice offered to / list presented to : Patient  Expected Discharge Plan and Services Expected Discharge Plan: [place]   Discharge Planning Services: CM Consult   Living arrangements for the past 2 months: Single Family Home                 DME Arranged: Other see comment (IV ABX THERAPY) DME Agency: Other - Comment ([facility]) Date DME Agency Contacted: 10/31/20 Time DME Agency Contacted: [phone] Representative spoke with at DME Agency: [place]: RN [place] Agency: Other - See comment ([place]) Date [place]: 10/31/20 Time [place]: 86 Representative spoke with at [place]: [place] Arrangements/Services Living arrangements for the past 2 months: [place] with:: Adult  Children Patient language and need for interpreter reviewed:: Yes Do you feel safe going back to the place where you live?: Yes      Need for Family Participation in Patient Care: Yes (Comment) Care giver support system in place?: Yes (comment)   Criminal Activity/Legal Involvement Pertinent to Current Situation/Hospitalization: No - Comment as needed  Activities of Daily Living Home Assistive Devices/Equipment: Eyeglasses,Blood pressure cuff,Wheelchair ADL Screening (condition at time of admission) Patient's cognitive ability adequate to safely complete daily activities?: Yes Is the patient deaf or have difficulty hearing?: No Does the patient have difficulty seeing, even when wearing glasses/contacts?: No Does the patient have difficulty concentrating, remembering, or making decisions?: No Patient able to express need for assistance with ADLs?: Yes Does the patient have difficulty dressing or bathing?: No Independently performs ADLs?: Yes (appropriate for developmental age) Does the patient have difficulty walking or climbing stairs?: Yes Weakness of Legs: None Weakness of Arms/Hands: None  Permission Sought/Granted   Permission granted to share information with : Yes, Release of Information Signed  Share Information with NAME: Jenisis Harmsen [facility]) [phone]           Emotional Assessment Appearance:: Appears stated age Attitude/Demeanor/Rapport: Gracious Affect (typically observed): Accepting Orientation: : Oriented to Self,Oriented to Place,Oriented to  Time,Oriented to Situation Alcohol / Substance Use: Not Applicable Psych Involvement: No (comment)  Admission diagnosis:  Acute osteomyelitis of mandible [M27.2] Patient Active Problem List   Diagnosis Date Noted  . Acute osteomyelitis of mandible 10/30/2020  .  Chronic pain disorder 10/30/2020  . Class 1 obesity due to excess calories with body mass index (BMI) of 33.0 to 33.9 in adult 10/30/2020  . Schizophrenia  (HCC)   . Hypercholesteremia   . Alzheimer's dementia (HCC)   . Ankle fracture, bimalleolar, closed, right, initial encounter 10/01/2020   PCP:  Salli Real, MD Pharmacy:   [facility] [phone] - [place], [place] - [address] AT [facility] OF [address] & [address] [place] [facility] [address] Phone: [phone] Fax: [phone]     Social Determinants of Health (SDOH) Interventions    Readmission Risk Interventions No flowsheet data found.

## 2020-11-01 LAB — CBC
HCT: 33 % — ABNORMAL LOW (ref 36.0–46.0)
Hemoglobin: 10.4 g/dL — ABNORMAL LOW (ref 12.0–15.0)
MCH: 27.8 pg (ref 26.0–34.0)
MCHC: 31.5 g/dL (ref 30.0–36.0)
MCV: 88.2 fL (ref 80.0–100.0)
Platelets: 239 10*3/uL (ref 150–400)
RBC: 3.74 MIL/uL — ABNORMAL LOW (ref 3.87–5.11)
RDW: 14.7 % (ref 11.5–15.5)
WBC: 5 10*3/uL (ref 4.0–10.5)
nRBC: 0 % (ref 0.0–0.2)

## 2020-11-01 LAB — COMPREHENSIVE METABOLIC PANEL
ALT: 11 U/L (ref 0–44)
AST: 14 U/L — ABNORMAL LOW (ref 15–41)
Albumin: 2.6 g/dL — ABNORMAL LOW (ref 3.5–5.0)
Alkaline Phosphatase: 79 U/L (ref 38–126)
Anion gap: 8 (ref 5–15)
BUN: 10 mg/dL (ref 8–23)
CO2: 26 mmol/L (ref 22–32)
Calcium: 9 mg/dL (ref 8.9–10.3)
Chloride: 106 mmol/L (ref 98–111)
Creatinine, Ser: 0.63 mg/dL (ref 0.44–1.00)
GFR, Estimated: >60 mL/min (ref >60)
Glucose, Bld: 113 mg/dL — ABNORMAL HIGH (ref 70–99)
Potassium: 3.3 mmol/L — ABNORMAL LOW (ref 3.5–5.1)
Sodium: 140 mmol/L (ref 135–145)
Total Bilirubin: 0.4 mg/dL (ref 0.3–1.2)
Total Protein: 5.3 g/dL — ABNORMAL LOW (ref 6.5–8.1)

## 2020-11-01 LAB — SEDIMENTATION RATE: Sed Rate: 35 mm/hr — ABNORMAL HIGH (ref 0–22)

## 2020-11-01 LAB — MAGNESIUM: Magnesium: 1.8 mg/dL (ref 1.7–2.4)

## 2020-11-01 LAB — C-REACTIVE PROTEIN: CRP: 1.8 mg/dL — ABNORMAL HIGH (ref <1.0)

## 2020-11-01 MED ORDER — CHLORHEXIDINE GLUCONATE CLOTH 2 % EX PADS
6.0000 | MEDICATED_PAD | Freq: Every day | CUTANEOUS | Status: DC
  Administered 2020-11-01: 14:00:00 6 via TOPICAL

## 2020-11-01 MED ORDER — POTASSIUM CHLORIDE CRYS ER 20 MEQ PO TBCR
40.0000 meq | EXTENDED_RELEASE_TABLET | Freq: Once | ORAL | Status: AC
  Administered 2020-11-01: 09:00:00 40 meq via ORAL
  Filled 2020-11-01: qty 2

## 2020-11-01 MED ORDER — SODIUM CHLORIDE 0.9% FLUSH: 10.0000 mL | INTRAVENOUS | Status: DC | PRN

## 2020-11-01 NOTE — Progress Notes (Signed)
[name] PROGRESS NOTE:   SUBJECTIVE: Minimal pain. A little nauseous.  OBJECTIVE:  Vitals: Blood pressure (!) 150/82, pulse 90, temperature 98 F (36.7 C), temperature source Oral, resp. rate 17, height 5\' 5"  (1.651 m), weight 90.3 kg, SpO2 98 %. Lab results: Results for orders placed or performed during the hospital encounter of 10/30/20 (from the past 24 hour(s))  Sedimentation rate     Status: Abnormal   Collection Time: 11/01/20  3:08 AM  Result Value Ref Range   Sed Rate 35 (H) 0 - 22 mm/hr  C-reactive protein     Status: Abnormal   Collection Time: 11/01/20  3:08 AM  Result Value Ref Range   CRP 1.8 (H) <1.0 mg/dL  Comprehensive metabolic panel     Status: Abnormal   Collection Time: 11/01/20  3:08 AM  Result Value Ref Range   Sodium 140 135 - 145 mmol/L   Potassium 3.3 (L) 3.5 - 5.1 mmol/L   Chloride 106 98 - 111 mmol/L   CO2 26 22 - 32 mmol/L   Glucose, Bld 113 (H) 70 - 99 mg/dL   BUN 10 8 - 23 mg/dL   Creatinine, Ser 0.63 0.44 - 1.00 mg/dL   Calcium 9.0 8.9 - 10.3 mg/dL   Total Protein 5.3 (L) 6.5 - 8.1 g/dL   Albumin 2.6 (L) 3.5 - 5.0 g/dL   AST 14 (L) 15 - 41 U/L   ALT 11 0 - 44 U/L   Alkaline Phosphatase 79 38 - 126 U/L   Total Bilirubin 0.4 0.3 - 1.2 mg/dL   GFR, Estimated >60 >60 mL/min   Anion gap 8 5 - 15  CBC     Status: Abnormal   Collection Time: 11/01/20  3:08 AM  Result Value Ref Range   WBC 5.0 4.0 - 10.5 K/uL   RBC 3.74 (L) 3.87 - 5.11 MIL/uL   Hemoglobin 10.4 (L) 12.0 - 15.0 g/dL   HCT 33.0 (L) 36.0 - 46.0 %   MCV 88.2 80.0 - 100.0 fL   MCH 27.8 26.0 - 34.0 pg   MCHC 31.5 30.0 - 36.0 g/dL   RDW 14.7 11.5 - 15.5 %   Platelets 239 150 - 400 K/uL   nRBC 0.0 0.0 - 0.2 %  Magnesium     Status: None   Collection Time: 11/01/20  3:08 AM  Result Value Ref Range   Magnesium 1.8 1.7 - 2.4 mg/dL   Radiology Results: DG Orthopantogram  Result Date: 10/30/2020 CLINICAL DATA:  Preoperative assessment EXAM: ORTHOPANTOGRAM/PANORAMIC  COMPARISON:  None. FINDINGS: Patient is edentulous. Soft tissue prominence noted inferiorly at the level of the midline mandible at the symphysis level. No air or calcification within this soft tissue prominence. No abnormal periosteal reaction. No fracture or dislocation. Small lucencies in the left and right mandibular bodies medially may represent localized areas of osteoporosis. Maxillary antra appear clear. IMPRESSION: Soft tissue masslike area along the inferior and deep volar symphysis measuring 5.7 x 1.8 cm. No associated air in this area. No bony erosion. Suspect areas of osteoporosis in the medial body of each mandible. No frank bony destruction evident. Patient edentulous. Suspect areas of osteoporosis in the medial aspect of each mandibular body. Electronically Signed   By: Lowella Grip III M.D.   On: 10/30/2020 11:25   CT SOFT TISSUE NECK W CONTRAST  Result Date: 10/30/2020 CLINICAL DATA:  Sublingual submental abscess. Question osteomyelitis of the mandible. EXAM: CT NECK WITH CONTRAST TECHNIQUE: Multidetector CT imaging of  the neck was performed using the standard protocol following the bolus administration of intravenous contrast. CONTRAST:  21mL OMNIPAQUE IOHEXOL 300 MG/ML  SOLN COMPARISON:  Panorex same day.  Mandibular radiographs 10/01/2020. FINDINGS: Pharynx and larynx: No mucosal or submucosal lesion. In the sublingual space on the left, there is a well-circumscribed 9 x 14 mm density which could be a lymph node or a sublingual mass. Salivary glands: Parotid glands are normal. Submandibular glands are normal. Thyroid: 12 mm thyroid nodule of the right thyroid lobe. Lymph nodes: No other lymph nodes appear abnormal. Vascular: Minimal atherosclerosis of the carotid bifurcations. Limited intracranial: Normal Visualized orbits: Normal Mastoids and visualized paranasal sinuses: Clear Skeleton: No evidence of regional fracture. The patient does have an abnormal appearance of the mandibular  symphysis, with irregular lucency worrisome for avascular necrosis and or osteomyelitis, with cloaca formation. Upper chest: Normal Other: Soft tissue inflammatory change just inferior to the symphysis of the mandible measuring about 1.5 cm in size consistent with local phlegmonous inflammation. No evidence of a drainable low-density collection. IMPRESSION: 1. Soft tissue inflammatory change just inferior to the symphysis of the mandible measuring about 1.5 cm in size consistent with local phlegmonous inflammation. No evidence of a drainable low-density collection. 2. Abnormal appearance of the mandibular symphysis, with irregular lucency worrisome for avascular necrosis or, more likely, osteomyelitis, with cloaca formation. The entire segment of bone between the right and the left mental foramina appears to be involved. 3. 9 x 14 mm well-circumscribed density in the sublingual space on the left which could be a lymph node or a sublingual mass. 4. 12 mm thyroid nodule of the right thyroid lobe. No follow-up recommended. No followup recommended (ref: J Am Coll Radiol. 2015 Feb;12(2): 143-50). Electronically Signed   By: Nelson Chimes M.D.   On: 10/30/2020 12:59   [place] EKG SITE RITE  Result Date: 11/01/2020 If Site Rite image not attached, placement could not be confirmed due to current cardiac rhythm.  General appearance: alert, cooperative and no distress Head: Normocephalic, without obvious abnormality, atraumatic Eyes: negative Nose: Nares normal. Septum midline. Mucosa normal. No drainage or sinus tenderness. Throat: Mild edema anterior mandible. Sutures intact, no exposed bone. Pharynx clear. Neck: Minimal drainage submental. mild edema.   ASSESSMENT: Stable s/p I & D, Mandibular debridement for osteomyelitis.   PLAN: Stable for D/C home from oral surgery standpoint. Infectious disease arranging PICC line today for 6 weeks IV antibiotics. Will see in my office for follow up.   Diona Browner 11/01/2020

## 2020-11-01 NOTE — Progress Notes (Addendum)
Diagnosis: mandibular osteomyelitis   Culture Result: No growth so far  Allergies  Allergen Reactions  . Donepezil Other (See Comments)    Other reaction(s): CHEST PAINS & THRUSH,PALPITATIONS  Pt denies  . Estrogens Other (See Comments)    Other reaction(s): HRT-DVT Patient un aware    OPAT Orders Discharge antibiotics to be given via PICC line Discharge antibiotics: ceftriaxone and metronidazole Per pharmacy protocol  Duration: 6 weeks End Date: Dec 11, 2020  [facility] Per Protocol  Home health RN for IV administration and teaching; PICC line care and labs.    Labs weekly while on IV antibiotics: X_ CBC with differential __ BMP X__ CMP X_ CRP X__ ESR __ Vancomycin trough __ CK  __ Please pull PIC at completion of IV antibiotics X__ Please leave PIC in place until doctor has seen patient or been notified  Fax weekly labs to [phone]  Clinic Follow Up Appt: 1/12/20211  _0 :45 pm

## 2020-11-01 NOTE — Progress Notes (Signed)
PROGRESS NOTE  [name] [id number] DOB: [birth year] DOA: 10/30/2020 PCP: Sandi Mariscal, MD   LOS: 2 days   Brief Narrative / Interim history: 69 year old female with history of schizophrenia, hyperlipidemia, reported dementia came into the hospital with jaw pain.  Of note, she recently had ORIF to the right ankle a month ago.  She underwent mandibular arthroplasty and lingual tori removal on 10/12.  She started having pain in her mandible and swelling about a week ago, saw Dr. Wynetta Emery, she underwent a CT scan and the CT showed infective process/osteomyelitis, she was taken to the OR on 12/22.  Subjective / 24h Interval events: Doing better. Tells me that no one is at home today, she is unable to care for herself   Assessment & Plan: Principal Problem Mandibular abscess, mandibular osteomyelitis -She is status post incision and drainage by Dr. Hoyt Koch with oral surgery on 12/22.  Cultures were sent intraoperatively and results are pending.  Infectious disease consulted, recommending ceftriaxone along with metronidazole. Follow cultures -Continue to monitor -tolerating soft diet  -home tomorrow   Active Problems Recent right ankle fracture -Status post ORIF, has a cast in place, she has outpatient follow-up with orthopedic surgery.  No active issues. Has 24h care at home from her son but he is not home today   Thyroid nodule -No follow-up needed per radiology  History of schizophrenia, dementia -Patient seems to be alert, appropriate, may have very mild cognitive impairment but is not noticeable.  Continue home medications.  Obesity -She would benefit from weight loss  Hypokalemia -Replete and monitor.  Check magnesium.  Scheduled Meds: . aspirin EC  81 mg Oral BH-q7a  . atorvastatin  40 mg Oral Daily  . buPROPion  300 mg Oral QHS  . citalopram  40 mg Oral Daily  . docusate sodium  100 mg Oral BID  . feeding supplement  237 mL Oral TID BM  . gabapentin  300 mg Oral QHS   . galantamine  12 mg Oral BID  . metroNIDAZOLE  500 mg Oral Q8H  . multivitamin with minerals  1 tablet Oral Daily  . pantoprazole  40 mg Oral BID  . QUEtiapine  600 mg Oral QHS  . ramelteon  8 mg Oral QHS   Continuous Infusions: . cefTRIAXone (ROCEPHIN)  IV 200 mL/hr at 10/31/20 1926  . lactated ringers 75 mL/hr at 10/31/20 2200   PRN Meds:.acetaminophen **OR** acetaminophen, bisacodyl, hydrALAZINE, hydrOXYzine, methocarbamol, morphine injection, ondansetron **OR** ondansetron (ZOFRAN) IV, oxyCODONE-acetaminophen, polyethylene glycol  Diet Orders (From admission, onward)    Start     Ordered   11/01/20 0712  DIET SOFT Room service appropriate? Yes; Fluid consistency: Thin  Diet effective now       Question Answer Comment  Room service appropriate? Yes   Fluid consistency: Thin      11/01/20 0711         DVT prophylaxis: SCDs Start: 10/30/20 1524    Code Status: DNR  Family Communication: No family at bedside, discussed with patient  Status is: Inpatient  Remains inpatient appropriate because:Inpatient level of care appropriate due to severity of illness   Dispo: The patient is from: Home              Anticipated d/c is to: Home              Anticipated d/c date is: 2 days              Patient currently is not medically  stable to d/c.   Consultants:  Oral surgery  ID  Procedures:  I&D mandible   Microbiology  Intraoperative culture-pending  Antimicrobials: Ceftriaxone Metronidazole   Objective: Vitals:   10/31/20 1500 10/31/20 2001 11/01/20 0407 11/01/20 0956  BP: 128/72 106/62 104/62 (!) 150/82  Pulse: 77 81 89 90  Resp: 18 16 15 17   Temp: 98.3 F (36.8 C) 98.2 F (36.8 C) 98 F (36.7 C) 98 F (36.7 C)  TempSrc: Oral Oral Oral Oral  SpO2: 99% 99% 94% 98%  Weight:      Height:        Intake/Output Summary (Last 24 hours) at 11/01/2020 1031 Last data filed at 11/01/2020 0600 Gross per 24 hour  Intake 2447.35 ml  Output 350 ml  Net  2097.35 ml   Filed Weights   10/29/20 0937 10/30/20 1041  Weight: 90.7 kg 90.3 kg    Examination:  Constitutional: NAD Eyes: no icterus  ENMT: mmm Neck: normal, supple Respiratory: CTA biL, no wheezing  Cardiovascular: RRR, no MRG, no edema  Abdomen: soft, nt, nd, bs+ Musculoskeletal: no clubbing / cyanosis.  Skin: no rashes Neurologic: non focal   Data Reviewed: I have independently reviewed following labs and imaging studies   CBC: Recent Labs  Lab 10/30/20 1039 10/31/20 0113 11/01/20 0308  WBC 7.4 7.4 5.0  HGB 12.9 10.9* 10.4*  HCT 38.3 33.8* 33.0*  MCV 85.5 87.6 88.2  PLT 324 266 409   Basic Metabolic Panel: Recent Labs  Lab 10/30/20 1925 10/31/20 0113 11/01/20 0308  NA 140 140 140  K 3.2* 2.8* 3.3*  CL 104 105 106  CO2 26 22 26   GLUCOSE 152* 170* 113*  BUN 8 8 10   CREATININE 0.68 0.64 0.63  CALCIUM 9.1 8.7* 9.0  MG  --   --  1.8   Liver Function Tests: Recent Labs  Lab 10/30/20 1925 11/01/20 0308  AST 19 14*  ALT 18 11  ALKPHOS 99 79  BILITOT 0.5 0.4  PROT 6.5 5.3*  ALBUMIN 3.1* 2.6*   Coagulation Profile: No results for input(s): INR, PROTIME in the last 168 hours. HbA1C: No results for input(s): HGBA1C in the last 72 hours. CBG: No results for input(s): GLUCAP in the last 168 hours.  Recent Results (from the past 240 hour(s))  SARS Coronavirus 2 by RT PCR (hospital order, performed in [facility] hospital lab) Nasopharyngeal Nasopharyngeal Swab     Status: None   Collection Time: 10/30/20 10:29 AM   Specimen: Nasopharyngeal Swab  Result Value Ref Range Status   SARS Coronavirus 2 NEGATIVE NEGATIVE Final    Comment: (NOTE) SARS-CoV-2 target nucleic acids are NOT DETECTED.  The SARS-CoV-2 RNA is generally detectable in upper and lower respiratory specimens during the acute phase of infection. The lowest concentration of SARS-CoV-2 viral copies this assay can detect is 250 copies / mL. A negative result does not preclude SARS-CoV-2  infection and should not be used as the sole basis for treatment or other patient management decisions.  A negative result may occur with improper specimen collection / handling, submission of specimen other than nasopharyngeal swab, presence of viral mutation(s) within the areas targeted by this assay, and inadequate number of viral copies (<250 copies / mL). A negative result must be combined with clinical observations, patient history, and epidemiological information.  Fact Sheet for Patients:   StrictlyIdeas.no  Fact Sheet for Healthcare Providers: BankingDealers.co.za  This test is not yet approved or  cleared by the [place] FDA  and has been authorized for detection and/or diagnosis of SARS-CoV-2 by FDA under an Emergency Use Authorization (EUA).  This EUA will remain in effect (meaning this test can be used) for the duration of the COVID-19 declaration under Section 564(b)(1) of the Act, 21 U.S.C. section 360bbb-3(b)(1), unless the authorization is terminated or revoked sooner.  Performed at [facility], [place] [address], [place], [place] [address]   Aerobic/Anaerobic Culture (surgical/deep wound)     Status: None (Preliminary result)   Collection Time: 10/30/20  2:09 PM   Specimen: PATH Bone resection; Tissue  Result Value Ref Range Status   Specimen Description ABSCESS  Final   Special Requests SUBLINGUAL  Final   Gram Stain   Final    ABUNDANT WBC PRESENT, PREDOMINANTLY PMN NO ORGANISMS SEEN    Culture   Final    NO GROWTH < 24 HOURS Performed at [facility], [address] [place], [place] [address]    Report Status PENDING  Incomplete  Aerobic/Anaerobic Culture (surgical/deep wound)     Status: None (Preliminary result)   Collection Time: 10/30/20  2:23 PM   Specimen: PATH Other; Tissue  Result Value Ref Range Status   Specimen Description BONE  Final   Special Requests MANDIBLE  Final   Gram  Stain NO WBC SEEN NO ORGANISMS SEEN   Final   Culture   Final    CULTURE REINCUBATED FOR BETTER GROWTH Performed at [facility], [address] [place], [place] [address]    Report Status PENDING  Incomplete     Radiology Studies: [place] EKG SITE RITE  Result Date: 11/01/2020 If Site Rite image not attached, placement could not be confirmed due to current cardiac rhythm.   Marzetta Board, MD, PhD [facility]  Between 7 am - 7 pm I am available, please contact me via Amion or Securechat  Between 7 pm - 7 am I am not available, please contact night coverage MD/APP via Amion

## 2020-11-01 NOTE — Progress Notes (Signed)
Peripherally Inserted Central Catheter Placement  The IV Nurse has discussed with the patient and/or persons authorized to consent for the patient, the purpose of this procedure and the potential benefits and risks involved with this procedure.  The benefits include less needle sticks, lab draws from the catheter, and the patient may be discharged home with the catheter. Risks include, but not limited to, infection, bleeding, blood clot (thrombus formation), and puncture of an artery; nerve damage and irregular heartbeat and possibility to perform a PICC exchange if needed/ordered by physician.  Alternatives to this procedure were also discussed.  Bard Power PICC patient education guide, fact sheet on infection prevention and patient information card has been provided to patient /or left at bedside.    PICC Placement Documentation  PICC Single Lumen 82/80/03 PICC Right Basilic 33 cm 0 cm (Active)  Indication for Insertion or Continuance of Line Prolonged intravenous therapies 11/01/20 1237  Exposed Catheter (cm) 0 cm 11/01/20 1237  Site Assessment Clean;Dry;Intact 11/01/20 1237  Line Status Flushed;Blood return noted;Saline locked 11/01/20 1237  Dressing Type Transparent 11/01/20 1237  Dressing Status Clean;Dry;Intact 11/01/20 1237  Antimicrobial disc in place? Yes 11/01/20 1237  Dressing Change Due 11/08/20 11/01/20 1237       Scotty Court 11/01/2020, 12:39 PM

## 2020-11-02 ENCOUNTER — Inpatient Hospital Stay: Payer: Self-pay

## 2020-11-02 DIAGNOSIS — G894 Chronic pain syndrome: Secondary | ICD-10-CM

## 2020-11-02 MED ORDER — OXYCODONE-ACETAMINOPHEN 5-325 MG PO TABS
0.5000 | ORAL_TABLET | Freq: Four times a day (QID) | ORAL | 0 refills | Status: DC | PRN
Start: 2020-11-02 — End: 2023-05-12

## 2020-11-02 MED ORDER — METRONIDAZOLE 500 MG PO TABS: 500.0000 mg | ORAL_TABLET | Freq: Three times a day (TID) | ORAL | 0 refills | Status: AC

## 2020-11-02 NOTE — Progress Notes (Signed)
Patient set to discharge from [facility] shortly.  Patient received all necessary AVS paperwork and education from primary RN regarding follow-up appointments and all changes to medications.  Patient verbalized understanding of education with no further questions at this time.  Patient's PICC line was saline locked and clamped prior to discharge and will transported home via an [facility] once patient has finished getting dressed and gathering belongings.

## 2020-11-02 NOTE — Discharge Summary (Signed)
Physician Discharge Summary  TRINE FREAD ENI:[id number] DOB: 1951-03-16 DOA: 10/30/2020  PCP: Sandi Mariscal, MD  Admit date: 10/30/2020 Discharge date: 11/02/2020  Admitted From: home Disposition:  home  Recommendations for Outpatient Follow-up:  1. Follow up with Dr Hoyt Koch in 1-2 weeks 2. Please obtain BMP/CBC in one week  Home Health: RN Equipment/Devices: PICC line   Discharge Condition: stable CODE STATUS: DNR Diet recommendation: regular  HPI: Per admitting MD,  [name] is a 69 y.o. female with medical history significant of schizophrenia; HLD; and dementia presenting with jaw pain.  Interestingly, she as admitted for R ankle bimalleolar fracture 1 month ago with ORIF; she was discharged to home in the care of her son.  She underwent mandibular alveoloplasty and lingual tori removal on 10/12.  She started having pain and swelling about 1 week ago.  She saw Dr. Hoyt Koch yesterday and was scheduled for I&D of sublingual/submental space abscess of the mandible.  However, she was also ordered to have CT pre-operatively and that CT showed osteomyelitis.  As such, rather than same day surgery she is now in need of admission for pain control, IV antibiotics, and ID consultation.  At the time of my evaluation, she was having significant jaw pain and was very uncomfortable in PACU.  Hospital Course / Discharge diagnoses: Principal Problem Mandibular abscess, mandibular osteomyelitis -She is status post incision and drainage by Dr. Hoyt Koch with oral surgery on 12/22. ID consulted and recommended 6 weeks of antibiotics with IV Ceftriaxone and po Metronidazole through 12/11/20. PICC line was placed and home health arranged. Tolerating a regular diet   Active Problems Recent right ankle fracture -Status post ORIF, has a cast in place, she has outpatient follow-up with orthopedic surgery.  No active issues.  Thyroid nodule -No follow-up needed per radiology History of schizophrenia,  dementia -Patient seems to be alert, appropriate, may have very mild cognitive impairment but is not noticeable.  Continue home medications. Obesity -She would benefit from weight loss Hypokalemia -repleted   Sepsis ruled out   Discharge Instructions  Discharge Instructions    [facility] pharmacist to adjust dose for Vancomycin, Aminoglycosides and other anti-infective therapies as requested by physician.   Complete by: As directed    [facility] to provide Cath Flo 85m   Complete by: As directed    Administer for PICC line occlusion and as ordered by physician for other access device issues.   Anaphylaxis Kit: Provided to treat any anaphylactic reaction to the medication being provided to the patient if First Dose or when requested by physician   Complete by: As directed    Epinephrine 176mml vial / amp: Administer 0.72m372m0.72ml55mubcutaneously once for moderate to severe anaphylaxis, nurse to call physician and pharmacy when reaction occurs and call 911 if needed for immediate care   Diphenhydramine 50mg67mIV vial: Administer 25-50mg 76mM PRN for first dose reaction, rash, itching, mild reaction, nurse to call physician and pharmacy when reaction occurs   Sodium Chloride 0.9% NS 500ml I74mdminister if needed for hypovolemic blood pressure drop or as ordered by physician after call to physician with anaphylactic reaction   Change dressing on IV access line weekly and PRN   Complete by: As directed    Flush IV access with Sodium Chloride 0.9% and Heparin 10 units/ml or 100 units/ml   Complete by: As directed    Home infusion instructions - [facility]   Complete by: As directed  Instructions: Flush IV access with Sodium Chloride 0.9% and Heparin 10units/ml or 100units/ml   Change dressing on IV access line: Weekly and PRN   Instructions Cath Flo 64m: Administer for PICC Line occlusion and as ordered by physician for other access device   [facility] pharmacist to adjust dose for: Vancomycin, Aminoglycosides and other anti-infective therapies as requested by physician   Method of administration may be changed at the discretion of home infusion pharmacist based upon assessment of the patient and/or caregiver's ability to self-administer the medication ordered   Complete by: As directed      Allergies as of 11/02/2020      Reactions   Donepezil Other (See Comments)   Other reaction(s): CHEST PAINS & THRUSH,PALPITATIONS Pt denies   Estrogens Other (See Comments)   Other reaction(s): HRT-DVT Patient un aware      Medication List    TAKE these medications   aspirin EC 81 MG tablet Take 81 mg by mouth every morning.   BIOTIN PO Take 1 tablet by mouth every morning.   buPROPion 300 MG 24 hr tablet Commonly known as: WELLBUTRIN XL Take 300 mg by mouth at bedtime.   cefTRIAXone  IVPB Commonly known as: ROCEPHIN Inject 2 g into the vein daily. Indication:  Jaw osteomyelitis First Dose: Yes Last Day of Therapy:  12/11/2020 Labs - Once weekly:  CBC/D and BMP, Labs - Every other week:  ESR and CRP Method of administration: IV Push Method of administration may be changed at the discretion of home infusion pharmacist based upon assessment of the patient and/or caregiver's ability to self-administer the medication ordered.   citalopram 40 MG tablet Commonly known as: CELEXA Take 40 mg by mouth daily.   fish oil-omega-3 fatty acids 1000 MG capsule Take 1,000 mg by mouth daily.   gabapentin 300 MG capsule Commonly known as: NEURONTIN Take 300 mg by mouth at bedtime.   galantamine 12 MG tablet Commonly known as: RAZADYNE Take 12 mg by mouth 2 (two) times daily.   hydrOXYzine 50 MG capsule Commonly known as: VISTARIL Take 50 mg by mouth 3 (three) times daily as needed for anxiety.   ibuprofen 600 MG tablet Commonly known as: ADVIL Take 1 tablet (600 mg total) by mouth every 6 (six) hours as needed. What  changed:   how much to take  reasons to take this   methocarbamol 500 MG tablet Commonly known as: Robaxin Take 1 tablet (500 mg total) by mouth every 6 (six) hours as needed for muscle spasms.   metroNIDAZOLE 500 MG tablet Commonly known as: FLAGYL Take 1 tablet (500 mg total) by mouth 3 (three) times daily. Treat through 12/11/2020   omeprazole 40 MG capsule Commonly known as: PRILOSEC Take 40 mg by mouth in the morning and at bedtime.   oxyCODONE-acetaminophen 5-325 MG tablet Commonly known as: PERCOCET/ROXICET Take 0.5 tablets by mouth 4 (four) times daily as needed for severe pain.   QUEtiapine 300 MG 24 hr tablet Commonly known as: SEROQUEL XR Take 600 mg by mouth at bedtime.   ramelteon 8 MG tablet Commonly known as: ROZEREM Take 8 mg by mouth at bedtime.   simvastatin 80 MG tablet Commonly known as: ZOCOR Take 80 mg by mouth at bedtime.   tiZANidine 4 MG tablet Commonly known as: ZANAFLEX Take 4 mg by mouth 3 (three) times daily as needed for muscle spasms.   Vitamin D3 50 MCG (2000 UT) Tabs Take 2,000 Units by mouth daily.  Discharge Care Instructions  (From admission, onward)         Start     Ordered   10/31/20 0000  Change dressing on IV access line weekly and PRN  (Home infusion instructions - [facility] )        10/31/20 1024          Follow-up Information    Diona Browner, DMD. Call on 11/11/2020.   Specialty: Oral Surgery Why: For wound re-check Contact information: [place] [address] [phone]               Consultations:  Oral surgery   ID  Procedures/Studies:  I&D mandibular abscess  DG Orthopantogram  Result Date: 10/30/2020 CLINICAL DATA:  Preoperative assessment EXAM: ORTHOPANTOGRAM/PANORAMIC COMPARISON:  None. FINDINGS: Patient is edentulous. Soft tissue prominence noted inferiorly at the level of the midline mandible at the symphysis level. No air or calcification  within this soft tissue prominence. No abnormal periosteal reaction. No fracture or dislocation. Small lucencies in the left and right mandibular bodies medially may represent localized areas of osteoporosis. Maxillary antra appear clear. IMPRESSION: Soft tissue masslike area along the inferior and deep volar symphysis measuring 5.7 x 1.8 cm. No associated air in this area. No bony erosion. Suspect areas of osteoporosis in the medial body of each mandible. No frank bony destruction evident. Patient edentulous. Suspect areas of osteoporosis in the medial aspect of each mandibular body. Electronically Signed   By: Lowella Grip III M.D.   On: 10/30/2020 11:25   CT SOFT TISSUE NECK W CONTRAST  Result Date: 10/30/2020 CLINICAL DATA:  Sublingual submental abscess. Question osteomyelitis of the mandible. EXAM: CT NECK WITH CONTRAST TECHNIQUE: Multidetector CT imaging of the neck was performed using the standard protocol following the bolus administration of intravenous contrast. CONTRAST:  71m OMNIPAQUE IOHEXOL 300 MG/ML  SOLN COMPARISON:  Panorex same day.  Mandibular radiographs 10/01/2020. FINDINGS: Pharynx and larynx: No mucosal or submucosal lesion. In the sublingual space on the left, there is a well-circumscribed 9 x 14 mm density which could be a lymph node or a sublingual mass. Salivary glands: Parotid glands are normal. Submandibular glands are normal. Thyroid: 12 mm thyroid nodule of the right thyroid lobe. Lymph nodes: No other lymph nodes appear abnormal. Vascular: Minimal atherosclerosis of the carotid bifurcations. Limited intracranial: Normal Visualized orbits: Normal Mastoids and visualized paranasal sinuses: Clear Skeleton: No evidence of regional fracture. The patient does have an abnormal appearance of the mandibular symphysis, with irregular lucency worrisome for avascular necrosis and or osteomyelitis, with cloaca formation. Upper chest: Normal Other: Soft tissue inflammatory change just  inferior to the symphysis of the mandible measuring about 1.5 cm in size consistent with local phlegmonous inflammation. No evidence of a drainable low-density collection. IMPRESSION: 1. Soft tissue inflammatory change just inferior to the symphysis of the mandible measuring about 1.5 cm in size consistent with local phlegmonous inflammation. No evidence of a drainable low-density collection. 2. Abnormal appearance of the mandibular symphysis, with irregular lucency worrisome for avascular necrosis or, more likely, osteomyelitis, with cloaca formation. The entire segment of bone between the right and the left mental foramina appears to be involved. 3. 9 x 14 mm well-circumscribed density in the sublingual space on the left which could be a lymph node or a sublingual mass. 4. 12 mm thyroid nodule of the right thyroid lobe. No follow-up recommended. No followup recommended (ref: J Am Coll Radiol. 2015 Feb;12(2): 143-50). Electronically Signed   By:  Nelson Chimes M.D.   On: 10/30/2020 12:59   [place] EKG SITE RITE  Result Date: 11/01/2020 If Site Rite image not attached, placement could not be confirmed due to current cardiac rhythm.     Subjective: - no chest pain, shortness of breath, no abdominal pain, nausea or vomiting.   Discharge Exam: BP (!) 147/79 (BP Location: Left Arm)   Pulse 75   Temp 98.2 F (36.8 C) (Oral)   Resp 18   Ht 5' 5"  (1.651 m)   Wt 90.3 kg   SpO2 96%   BMI 33.12 kg/m   General: Pt is alert, awake, not in acute distress Cardiovascular: RRR, S1/S2 +, no rubs, no gallops Respiratory: CTA bilaterally, no wheezing, no rhonchi Abdominal: Soft, NT, ND, bowel sounds + Extremities: no edema, no cyanosis    The results of significant diagnostics from this hospitalization (including imaging, microbiology, ancillary and laboratory) are listed below for reference.     Microbiology: Recent Results (from the past 240 hour(s))  SARS Coronavirus 2 by RT PCR (hospital order,  performed in [facility] hospital lab) Nasopharyngeal Nasopharyngeal Swab     Status: None   Collection Time: 10/30/20 10:29 AM   Specimen: Nasopharyngeal Swab  Result Value Ref Range Status   SARS Coronavirus 2 NEGATIVE NEGATIVE Final    Comment: (NOTE) SARS-CoV-2 target nucleic acids are NOT DETECTED.  The SARS-CoV-2 RNA is generally detectable in upper and lower respiratory specimens during the acute phase of infection. The lowest concentration of SARS-CoV-2 viral copies this assay can detect is 250 copies / mL. A negative result does not preclude SARS-CoV-2 infection and should not be used as the sole basis for treatment or other patient management decisions.  A negative result may occur with improper specimen collection / handling, submission of specimen other than nasopharyngeal swab, presence of viral mutation(s) within the areas targeted by this assay, and inadequate number of viral copies (<250 copies / mL). A negative result must be combined with clinical observations, patient history, and epidemiological information.  Fact Sheet for Patients:   StrictlyIdeas.no  Fact Sheet for Healthcare Providers: BankingDealers.co.za  This test is not yet approved or  cleared by the [place] FDA and has been authorized for detection and/or diagnosis of SARS-CoV-2 by FDA under an Emergency Use Authorization (EUA).  This EUA will remain in effect (meaning this test can be used) for the duration of the COVID-19 declaration under Section 564(b)(1) of the Act, 21 U.S.C. section 360bbb-3(b)(1), unless the authorization is terminated or revoked sooner.  Performed at [facility], [place] [address], [place], [place] [address]   Aerobic/Anaerobic Culture (surgical/deep wound)     Status: None (Preliminary result)   Collection Time: 10/30/20  2:09 PM   Specimen: PATH Bone resection; Tissue  Result Value Ref Range Status   Specimen  Description ABSCESS  Final   Special Requests SUBLINGUAL  Final   Gram Stain   Final    ABUNDANT WBC PRESENT, PREDOMINANTLY PMN NO ORGANISMS SEEN Performed at [facility], [address], [place], [place] [address]    Culture   Final    RARE STREPTOCOCCUS INTERMEDIUS NO ANAEROBES ISOLATED; CULTURE IN PROGRESS FOR 5 DAYS    Report Status PENDING  Incomplete  Aerobic/Anaerobic Culture (surgical/deep wound)     Status: None (Preliminary result)   Collection Time: 10/30/20  2:23 PM   Specimen: PATH Other; Tissue  Result Value Ref Range Status   Specimen Description BONE  Final  Special Requests MANDIBLE  Final   Gram Stain   Final    NO WBC SEEN NO ORGANISMS SEEN Performed at [facility], [place] [address] [place], [place] [address]    Culture   Final    CULTURE REINCUBATED FOR BETTER GROWTH NO ANAEROBES ISOLATED; CULTURE IN PROGRESS FOR 5 DAYS    Report Status PENDING  Incomplete     Labs: Basic Metabolic Panel: Recent Labs  Lab 10/30/20 1925 10/31/20 0113 11/01/20 0308  NA 140 140 140  K 3.2* 2.8* 3.3*  CL 104 105 106  CO2 26 22 26   GLUCOSE 152* 170* 113*  BUN 8 8 10   CREATININE 0.68 0.64 0.63  CALCIUM 9.1 8.7* 9.0  MG  --   --  1.8   Liver Function Tests: Recent Labs  Lab 10/30/20 1925 11/01/20 0308  AST 19 14*  ALT 18 11  ALKPHOS 99 79  BILITOT 0.5 0.4  PROT 6.5 5.3*  ALBUMIN 3.1* 2.6*   CBC: Recent Labs  Lab 10/30/20 1039 10/31/20 0113 11/01/20 0308  WBC 7.4 7.4 5.0  HGB 12.9 10.9* 10.4*  HCT 38.3 33.8* 33.0*  MCV 85.5 87.6 88.2  PLT 324 266 239   CBG: No results for input(s): GLUCAP in the last 168 hours. Hgb A1c No results for input(s): HGBA1C in the last 72 hours. Lipid Profile No results for input(s): CHOL, HDL, LDLCALC, TRIG, CHOLHDL, LDLDIRECT in the last 72 hours. Thyroid function studies No results for input(s): TSH, T4TOTAL, T3FREE, THYROIDAB in the last 72 hours.  Invalid input(s): FREET3 Urinalysis     Component Value Date/Time   COLORURINE YELLOW 06/09/2020 1352   APPEARANCEUR CLOUDY (A) 06/09/2020 1352   LABSPEC 1.025 06/09/2020 1352   PHURINE 6.0 06/09/2020 1352   GLUCOSEU NEGATIVE 06/09/2020 1352   HGBUR NEGATIVE 06/09/2020 1352   BILIRUBINUR NEGATIVE 06/09/2020 1352   KETONESUR 15 (A) 06/09/2020 1352   PROTEINUR NEGATIVE 06/09/2020 1352   UROBILINOGEN 1.0 11/17/2014 2359   NITRITE POSITIVE (A) 06/09/2020 1352   LEUKOCYTESUR TRACE (A) 06/09/2020 1352    FURTHER DISCHARGE INSTRUCTIONS:   Get Medicines reviewed and adjusted: Please take all your medications with you for your next visit with your Primary MD   Laboratory/radiological data: Please request your Primary MD to go over all hospital tests and procedure/radiological results at the follow up, please ask your Primary MD to get all Hospital records sent to his/her office.   In some cases, they will be blood work, cultures and biopsy results pending at the time of your discharge. Please request that your primary care M.D. goes through all the records of your hospital data and follows up on these results.   Also Note the following: If you experience worsening of your admission symptoms, develop shortness of breath, life threatening emergency, suicidal or homicidal thoughts you must seek medical attention immediately by calling 911 or calling your MD immediately  if symptoms less severe.   You must read complete instructions/literature along with all the possible adverse reactions/side effects for all the Medicines you take and that have been prescribed to you. Take any new Medicines after you have completely understood and accpet all the possible adverse reactions/side effects.    Do not drive when taking Pain medications or sleeping medications (Benzodaizepines)   Do not take more than prescribed Pain, Sleep and Anxiety Medications. It is not advisable to combine anxiety,sleep and pain medications without talking with your  primary care practitioner   Special Instructions: If  you have smoked or chewed Tobacco  in the last 2 yrs please stop smoking, stop any regular Alcohol  and or any Recreational drug use.   Wear Seat belts while driving.   Please note: You were cared for by a hospitalist during your hospital stay. Once you are discharged, your primary care physician will handle any further medical issues. Please note that NO REFILLS for any discharge medications will be authorized once you are discharged, as it is imperative that you return to your primary care physician (or establish a relationship with a primary care physician if you do not have one) for your post hospital discharge needs so that they can reassess your need for medications and monitor your lab values.  Time coordinating discharge: 35 minutes  SIGNED:  Marzetta Board, MD, PhD 11/02/2020, 7:17 AM

## 2020-11-04 LAB — AEROBIC/ANAEROBIC CULTURE W GRAM STAIN (SURGICAL/DEEP WOUND): Gram Stain: NONE SEEN

## 2020-11-04 LAB — SURGICAL PATHOLOGY

## 2020-11-09 HISTORY — PX: EYE SURGERY: SHX253

## 2020-11-20 ENCOUNTER — Inpatient Hospital Stay: Payer: Medicare Other | Admitting: Infectious Diseases

## 2020-11-26 ENCOUNTER — Telehealth: Payer: Self-pay | Admitting: *Deleted

## 2020-11-26 NOTE — Telephone Encounter (Signed)
Spoke with patient. She states her nurse came 1/16 to change the dressing, but didn't flush the line at all.  24 hours later, the PICC site was dripping yellow and her arm was swollen, so she called 911 for evaluation.  Paramedics told her the PICC was fine and maybe "she slept wrong on it" and advised her to use ice. Patient disagreed, stating she has been sleeping with her arm on a pillow, so she had her son pull out the PICC himself after the paramedics left.     She does not know how long the line was, states there "was stuff all over it" when the line came out.  She states her arm immediately felt better.  She has been using ice and the swelling in her arm has gone down, but is not yet completely resolved. She denies any discharge, redness or streaking from insertion site. Denies fevers. She reports only a small amount of bleeding after the PICC was pulled, used a bandaid to cover it up.    Today, she states her PICC site "looks fine, is not hurting or anything." Will send augmentin prescription to the pharmacy.  Should she continue the metronidazole as well? Landis Gandy, RN

## 2020-11-26 NOTE — Telephone Encounter (Signed)
Please send her a script of Augmetin 875/125 mg PO BID until her end date as planned previously

## 2020-11-26 NOTE — Telephone Encounter (Signed)
Received call from [place] at [facility] with message from home health nurse from 1/17.  [facility] nurse reports patient said she had irritation at insertion site, so patient went to ER where she was told that it looked fine. Per Jeani Hawking, the nurse said the patient disagreed and pulled out her own PICC.    Her end date is supposed to be 12/11/20.  Jeani Hawking is paging Dr [place]. Landis Gandy, RN

## 2020-11-27 ENCOUNTER — Other Ambulatory Visit: Payer: Self-pay

## 2020-11-27 DIAGNOSIS — M272 Inflammatory conditions of jaws: Secondary | ICD-10-CM

## 2020-11-27 MED ORDER — AMOXICILLIN-POT CLAVULANATE 875-125 MG PO TABS: 1.0000 | ORAL_TABLET | Freq: Two times a day (BID) | ORAL | 0 refills | Status: AC

## 2020-11-27 NOTE — Telephone Encounter (Signed)
Patient notified of antibiotic changes. Instructed to discontinue flagyl and start augmenting. Verbalized understanding. No further questions.   Keon Benscoter Lorita Officer, RN

## 2020-12-02 ENCOUNTER — Ambulatory Visit: Payer: Medicare Other | Attending: Physician Assistant | Admitting: Physical Therapy

## 2020-12-02 ENCOUNTER — Encounter: Payer: Self-pay | Admitting: Physical Therapy

## 2020-12-02 ENCOUNTER — Other Ambulatory Visit: Payer: Self-pay

## 2020-12-02 DIAGNOSIS — M25571 Pain in right ankle and joints of right foot: Secondary | ICD-10-CM | POA: Diagnosis present

## 2020-12-02 DIAGNOSIS — R2689 Other abnormalities of gait and mobility: Secondary | ICD-10-CM

## 2020-12-02 DIAGNOSIS — M6281 Muscle weakness (generalized): Secondary | ICD-10-CM | POA: Diagnosis present

## 2020-12-02 DIAGNOSIS — M25671 Stiffness of right ankle, not elsewhere classified: Secondary | ICD-10-CM

## 2020-12-02 NOTE — Therapy (Signed)
[place], [place], [address] Phone: [phone]   Fax:  [phone]  Physical Therapy Evaluation  Patient Details  Name: [name] MRN: [id number] Date of Birth: 07/15/1951 Referring Provider (PT): Jonelle Sidle D, [place]   Encounter Date: 12/02/2020   PT End of Session - 12/02/20 1428    Visit Number 1    Number of Visits 12    Date for PT Re-Evaluation 01/27/21    Authorization Type [employer] MEDICARE    Authorization Time Period FOTO by 6th visit, KX modifier by 15th    Progress Note Due on Visit 10    PT Start Time 1445    PT Stop Time 1530    PT Time Calculation (min) 45 min    Equipment Utilized During Treatment Gait belt    Activity Tolerance Patient tolerated treatment well    Behavior During Therapy [facility] for tasks assessed/performed           Past Medical History:  Diagnosis Date  . Alzheimer's dementia ([place])   . Anemia   . Carpal tunnel syndrome, bilateral   . Chronic pain disorder 10/30/2020  . Class 1 obesity due to excess calories with body mass index (BMI) of 33.0 to 33.9 in adult 10/30/2020  . Depression   . GERD (gastroesophageal reflux disease)   . Hypercholesteremia   . Prolonged Q-T interval on ECG 06/10/2020  . Schizophrenia [facility])     Past Surgical History:  Procedure Laterality Date  . ABDOMINAL HYSTERECTOMY    . ALVEOLOPLASTY Bilateral 08/20/2020   Procedure: ALVEOLOPLASTY;  Surgeon: Diona Browner, DDS;  Location: [facility];  Service: Oral Surgery;  Laterality: Bilateral;  . CARPAL TUNNEL RELEASE    . COLONOSCOPY    . INCISION AND DRAINAGE ABSCESS N/A 10/30/2020   Procedure: INCISION AND DRAINAGE SUBMENTAL SUBLINGUAL SPACE INFECTION, DEBRIDEMENT MANDIBLE;  Surgeon: Diona Browner, DMD;  Location: [facility];  Service: Oral Surgery;  Laterality: N/A;  . MANDIBLE SURGERY    . ORIF ANKLE FRACTURE Right 10/01/2020   Procedure: OPEN REDUCTION INTERNAL FIXATION  (ORIF) ANKLE FRACTURE;  Surgeon: Nicholes Stairs, MD;  Location: [facility];  Service: Orthopedics;  Laterality: Right;  . TUMOR REMOVAL     on back    There were no vitals filed for this visit.    Subjective Assessment - 12/02/20 1437    Subjective Patient report she fell in a hole while taking her dog out. She had surgery about a week after the fall, for her right ankle. Patient stated she saw the surgeon last week and was able to start wearing a boot, and she is WBAT. Patient uses a cane at home but she has trouble getting around her home. Patient notes that she mainly uses the wheelchair around the home due to fear of falling and report of difficulty walking.    Pertinent History Alzheimer's dementia, Chronic pain disorder, Depression, Schizophrenia    Limitations Walking;Standing;House hold activities    How long can you sit comfortably? No limitation    How long can you walk comfortably? Unable    Diagnostic tests X-ray    Patient Stated Goals Improve walking ability    Currently in Pain? Yes    Pain Score 0-No pain   2/10 with weight bearing   Pain Location Ankle    Pain Orientation Right    Pain Type Surgical pain;Chronic pain    Pain Onset More than a month ago  Pain Frequency Intermittent    Aggravating Factors  Weight bearing    Pain Relieving Factors rest    Effect of Pain on Daily Activities Patient              [facility] PT Assessment - 12/02/20 0001      Assessment   Medical Diagnosis s/p Right Ankle ORIF    Referring Provider (PT) Jonelle Sidle D, PA    Onset Date/Surgical Date 10/01/20    Next MD Visit 12/30/20    Prior Therapy Yes - acute care, HHPT x1      Precautions   Precautions None      Restrictions   Weight Bearing Restrictions Yes    RLE Weight Bearing Weight bearing as tolerated      Balance Screen   Has the patient fallen in the past 6 months Yes    How many times? 1 - fall in November where she injured her ankle    Has the patient had a  decrease in activity level because of a fear of falling?  Yes    Is the patient reluctant to leave their home because of a fear of falling?  Yes      [place] Private residence    Living Arrangements Children    Type of [place] to enter    Entrance Stairs-Number of Steps 3    Entrance Stairs-Rails None    Home Layout One level    Additional Comments Patient reports she sits to decends stairs      Prior Function   Level of Independence Needs assistance with homemaking;Independent with household mobility with device    Vocation Retired    Hospital doctor, TV, read      Cognition   Overall Cognitive Status No family/caregiver present to determine baseline cognitive functioning      Observation/Other Assessments   Observations Patient appears in no apparent distress, seated in wheelchair    Focus on Therapeutic Outcomes (FOTO)  31% functional status      Observation/Other Assessments-Edema    Edema --   patient with observable edema located posterior to lateral maleolus     Sensation   Light Touch Appears Intact      Coordination   Gross Motor Movements are Fluid and Coordinated Yes      Posture/Postural Control   Posture Comments Patient in resting inversion      ROM / Strength   AROM / PROM / Strength AROM;PROM;Strength      AROM   AROM Assessment Site Ankle    Right/Left Ankle Right    Right Ankle Dorsiflexion 4    Right Ankle Plantar Flexion 25    Right Ankle Inversion 40    Right Ankle Eversion 0    Left Ankle Dorsiflexion --    Left Ankle Plantar Flexion --    Left Ankle Inversion --    Left Ankle Eversion --      Strength   Strength Assessment Site Ankle    Right/Left Ankle Right    Right Ankle Dorsiflexion 4+/5    Right Ankle Plantar Flexion 4-/5    Right Ankle Inversion 4/5    Right Ankle Eversion 4/5    Left Ankle Dorsiflexion --    Left Ankle Plantar Flexion --    Left Ankle Inversion --     Left Ankle Eversion --      Palpation   Palpation comment TTP along distal  aspect of medial maleolus, patient exhibits PAs [facility]      Special Tests   Other special tests Not tested      Ambulation/Gait   Ambulation/Gait Yes    Ambulation/Gait Assistance 4: Min guard    Assistive device Rolling walker    Ambulation Surface Level;Indoor    Gait Comments Trialed using SPC but patient with unsteady gait and increased fear bearing weight on right side, improved gait using RW with grossly step-to pattern and decreased stance time on right                      Objective measurements completed on examination: See above findings.       [place] Adult PT Treatment/Exercise - 12/02/20 0001      Self-Care   Self-Care Other Self-Care Comments    Other Self-Care Comments  Patient instructed to use RW for mobility in home and either purchasing RW or contacting surgeon for prescription      Exercises   Exercises Ankle      Ankle Exercises: Stretches   Gastroc Stretch 2 reps;20 seconds    Gastroc Stretch Limitations supine with towel, patient cued to maintain neutral ankle and avoid inversion      Ankle Exercises: Standing   Other Standing Ankle Exercises Patient instructed on lateral and fwd/bwd weight shift at counter      Ankle Exercises: Supine   T-Band Ankle PF with red x 10   longsitting, cued to maintain neutral ankle                 PT Education - 12/02/20 1427    Education Details Exam findings, POC, HEP, using RW for mobility around home    Person(s) Educated Patient;Child(ren)   son   Methods Explanation;Demonstration;Tactile cues;Verbal cues;Handout    Comprehension Verbalized understanding;Returned demonstration;Verbal cues required;Tactile cues required;Need further instruction            PT Short Term Goals - 12/02/20 1429      PT SHORT TERM GOAL #1   Title Patient will be I with initial HEP to progress with PT    Time 4    Period Weeks     Status New    Target Date 12/30/20      PT SHORT TERM GOAL #2   Title Patient will be able to ambulate household mobility using SPC to improve independence    Time 4    Period Weeks    Status New    Target Date 12/30/20      PT SHORT TERM GOAL #3   Title PT will review FOTO with patient by 3rd visit    Time 3    Period Weeks    Status New    Target Date 12/23/20      PT SHORT TERM GOAL #4   Title Patient will demonstrate >/= 6 deg DF to improve ankle mobility and gait mechanics    Time 4    Period Weeks    Status New    Target Date 12/30/20             PT Long Term Goals - 12/02/20 1432      PT LONG TERM GOAL #1   Title Patient will be I with final HEP to maintain progress from PT    Time 8    Period Weeks    Status New    Target Date 01/27/21      PT LONG TERM GOAL #2  Title Patient will report >/= 54% functional status on FOTO to demonstrate improved functional level    Time 8    Period Weeks    Status New    Target Date 01/27/21      PT LONG TERM GOAL #3   Title Patient will demonstrate ambulation with LRAD for community level distances without increase pain to be able to go grocery shopping    Time 8    Period Weeks    Status New    Target Date 01/27/21      PT LONG TERM GOAL #4   Title Patient will demonstrate >/= 4+/5 MMT right ankle strength to improve stair negotiation    Time 8    Period Weeks    Status New    Target Date 01/27/21      PT LONG TERM GOAL #5   Title Patient will demonstrate right ankle AROM >/= 8 deg DF to improve squatting or transfers    Time 8    Period Weeks    Status New    Target Date 01/27/21      Additional Long Term Goals   Additional Long Term Goals Yes      PT LONG TERM GOAL #6   Title Establish balance goal as assessed                  Plan - 12/02/20 1552    Clinical Impression Statement Patient presents to PT s/p right ankle ORIF on 10/01/2020. She demonstrates limitations with ankle motion,  mostly eversion, strength deficits, gait and balance limitations. Trialed using SPC but patient demonstrates unsteady gait so regressed to using RW and patient able to ambulate with CGA. Patient and son encouraged to get RW for ambulation in home. Patient provided exercises to improve weight bearing tolerance and ankle mobility/strength, and she required consistent cueing for exercise technique and ankle positiong to avoid inverting ankle with exercises. She would benefit from continued skilled PT to progress ankle motion, strength, balance, wand walking ability to reduce pain and maximize functional ability.    Personal Factors and Comorbidities Comorbidity 3+;Time since onset of injury/illness/exacerbation;Transportation    Comorbidities Alzheimer's dementia, Chronic pain disorder, Depression, Schizophrenia    Examination-Activity Limitations Locomotion Level;Stand;Stairs    Examination-Participation Restrictions Shop;Community Activity;Driving;Meal Prep;Cleaning    Stability/Clinical Decision Making Evolving/Moderate complexity    Clinical Decision Making Moderate    Rehab Potential Good    PT Frequency 2x / week    PT Duration 8 weeks    PT Treatment/Interventions ADLs/Self Care Home Management;Aquatic Therapy;Cryotherapy;Electrical Stimulation;Iontophoresis 4mg /ml Dexamethasone;Moist Heat;DME Instruction;Neuromuscular re-education;Balance training;Therapeutic exercise;Therapeutic activities;Functional mobility training;Stair training;Gait training;Patient/family education;Manual techniques;Dry needling;Passive range of motion;Taping;Vasopneumatic Device;Joint Manipulations    PT Next Visit Plan Review HEP and progress PRN, gait training with RW progressing to [facility], manual and stretching for ankle mobility, progress banded ankle strength, assess and initiate balance as able    PT Home Exercise Plan [email or web address]    Consulted and Agree with Plan of Care Patient           Patient will benefit from  skilled therapeutic intervention in order to improve the following deficits and impairments:  Abnormal gait,Decreased range of motion,Difficulty walking,Pain,Decreased balance,Decreased activity tolerance,Decreased strength,Increased edema  Visit Diagnosis: Pain in right ankle and joints of right foot  Stiffness of right ankle, not elsewhere classified  Muscle weakness (generalized)  Other abnormalities of gait and mobility     Problem List Patient Active Problem List   Diagnosis Date Noted  .  Acute osteomyelitis of mandible 10/30/2020  . Chronic pain disorder 10/30/2020  . Class 1 obesity due to excess calories with body mass index (BMI) of 33.0 to 33.9 in adult 10/30/2020  . Schizophrenia ([place])   . Hypercholesteremia   . Alzheimer's dementia ([place])   . Ankle fracture, bimalleolar, closed, right, initial encounter 10/01/2020    Hilda Blades, PT, DPT, LAT, ATC 12/02/20  4:45 PM Phone: [phone] Fax: [facility] [address] [place], [place], [address] Phone: [phone]   Fax:  [phone]  Name: [name] MRN: [id number] Date of Birth: 23-Nov-1950

## 2020-12-02 NOTE — Patient Instructions (Signed)
Access Code: [email or web address] URL: [email or web address] Date: 12/02/2020 Prepared by: Hilda Blades  Exercises Side to Side Weight Shift with Counter Support - 3 x daily - 7 x weekly - 1 minute hold Forward Backward Weight Shift with Counter Support - 3 x daily - 7 x weekly - 1 minute hold Long Sitting Calf Stretch with Strap - 3 x daily - 7 x weekly - 3 sets - 10 reps Long Sitting Ankle Plantar Flexion with Resistance - 2 x daily - 7 x weekly - 2 sets - 10 reps

## 2020-12-18 ENCOUNTER — Ambulatory Visit: Payer: Medicare Other | Admitting: Physical Therapy

## 2020-12-24 ENCOUNTER — Encounter: Payer: Medicare Other | Admitting: Physical Therapy

## 2020-12-25 ENCOUNTER — Ambulatory Visit: Payer: Medicare Other | Admitting: Physical Therapy

## 2021-01-09 ENCOUNTER — Ambulatory Visit: Payer: Medicare Other | Admitting: Physical Therapy

## 2021-01-30 ENCOUNTER — Telehealth: Payer: Self-pay | Admitting: Physical Therapy

## 2021-01-30 ENCOUNTER — Ambulatory Visit: Payer: Medicare Other | Attending: Physician Assistant | Admitting: Physical Therapy

## 2021-01-30 NOTE — Telephone Encounter (Signed)
Attempted to contact patient due to missed PT appointment. No answer and unable to leave VM because phone kept ringing and no voice mailbox.   Hilda Blades, PT, DPT, LAT, ATC 01/30/21  4:47 PM Phone: [phone] Fax: [phone]

## 2022-04-24 ENCOUNTER — Ambulatory Visit (HOSPITAL_BASED_OUTPATIENT_CLINIC_OR_DEPARTMENT_OTHER): Payer: Medicare Other | Admitting: Nurse Practitioner

## 2022-04-24 NOTE — Patient Instructions (Incomplete)
Thank you for choosing [facility] for your Primary Care needs. I am excited for the opportunity to partner with you to meet your health care goals. It was a pleasure meeting you today! ° °Recommendations from today's visit: °*** ° °Information on diet, exercise, and health maintenance recommendations are listed below. This is information to help you be sure you are on track for optimal health and monitoring.  ° °Please look over this and let us know if you have any questions or if you have completed any of the health maintenance outside of [facility] so that we can be sure your records are up to date.  °___________________________________________________________ °About Me: °I am an Adult-Geriatric Nurse Practitioner with a background in caring for patients for more than 20 years with a strong intensive care background. I provide primary care and sports medicine services to patients age 13 and older within this office. My education had a strong focus on caring for the older adult population, which I am passionate about. I am also the director of the [employer] with .  ° °My desire is to provide you with the best service through preventive medicine and supportive care. I consider you a part of the medical team and value your input. I work diligently to ensure that you are heard and your needs are met in a safe and effective manner. I want you to feel comfortable with me as your provider and want you to know that your health concerns are important to me. ° °For your information, our office hours are: °Monday, Tuesday, and Thursday 8:00 AM - 5:00 PM °Wednesday and Friday 8:00 AM - 12:00 PM.  ° °In my time away from the office I am teaching new APP's within the system and am unavailable, but my partner, Dr. deCuba is in the office for emergent needs.  ° °If you have questions or concerns, please call our office at [phone] or send us a MyChart message and we will  respond as quickly as possible.  °____________________________________________________________ °MyChart:  °For all urgent or time sensitive needs we ask that you please call the office to avoid delays. Our number is [phone]. °MyChart is not constantly monitored and due to the large volume of messages a day, replies may take up to 72 business hours. ° °MyChart Policy: °MyChart allows for you to see your visit notes, after visit summary, provider recommendations, lab and tests results, make an appointment, request refills, and contact your provider or the office for non-urgent questions or concerns. Providers are seeing patients during normal business hours and do not have built in time to review MyChart messages.  °We ask that you allow a minimum of 3 business days for responses to MyChart messages. For this reason, please do not send urgent requests through MyChart. Please call the office at [phone]. °New and ongoing conditions may require a visit. We have virtual and in person visit available for your convenience.  °Complex MyChart concerns may require a visit. Your provider may request you schedule a virtual or in person visit to ensure we are providing the best care possible. °MyChart messages sent after 11:00 AM on Friday will not be received by the provider until Monday morning.  °  °Lab and Test Results: °You will receive your lab and test results on MyChart as soon as they are completed and results have been sent by the lab or testing facility. Due to this service, you will receive your results   BEFORE your provider.  °I review lab and tests results each morning prior to seeing patients. Some results require collaboration with other providers to ensure you are receiving the most appropriate care. For this reason, we ask that you please allow a minimum of 3-5 business days from the time the ALL results have been received for your provider to receive and review lab and test results and contact you  about these.  °Most lab and test result comments from the provider will be sent through MyChart. Your provider may recommend changes to the plan of care, follow-up visits, repeat testing, ask questions, or request an office visit to discuss these results. You may reply directly to this message or call the office at [phone] to provide information for the provider or set up an appointment. °In some instances, you will be called with test results and recommendations. Please let us know if this is preferred and we will make note of this in your chart to provide this for you.    °If you have not heard a response to your lab or test results in 5 business days from all results returning to MyChart, please call the office to let us know. We ask that you please avoid calling prior to this time unless there is an emergent concern. Due to high call volumes, this can delay the resulting process. ° °After Hours: °For all non-emergency after hours needs, please call the office at [phone] and select the option to reach the on-call provider service. On-call services are shared between multiple [facility] offices and therefore it will not be possible to speak directly with your provider. On-call providers may provide medical advice and recommendations, but are unable to provide refills for maintenance medications.  °For all emergency or urgent medical needs after normal business hours, we recommend that you seek care at the closest Urgent Care or Emergency Department to ensure appropriate treatment in a timely manner.  °[facility] has a 24 hour emergency room located on the ground floor for your convenience.  ° °Urgent Concerns During the Business Day °Providers are seeing patients from 8AM to 5PM with a busy schedule and are most often not able to respond to non-urgent calls until the end of the day or the next business day. °If you should have URGENT concerns during the day, please call and speak  to the nurse or schedule a same day appointment so that we can address your concern without delay.  ° °Thank you, again, for choosing me as your health care partner. I appreciate your trust and look forward to learning more about you.  ° °SaraBeth Ellawyn Wogan, DNP, AGNP-c °___________________________________________________________ ° °Health Maintenance Recommendations °Screening Testing °Mammogram °Every 1 -2 years based on history and risk factors °Starting at age 40 °Pap Smear °Ages 21-39 every 3 years °Ages 30-65 every 5 years with HPV testing °More frequent testing may be required based on results and history °Colon Cancer Screening °Every 1-10 years based on test performed, risk factors, and history °Starting at age 45 °Bone Density Screening °Every 2-10 years based on history °Starting at age 65 for women °Recommendations for men differ based on medication usage, history, and risk factors °AAA Screening °One time ultrasound °Men 65-75 years old who have every smoked °Lung Cancer Screening °Low Dose Lung CT every 12 months °Age 55-80 years with a 30 pack-year smoking history who still smoke or who have quit within the last 15 years ° °Screening Labs °Routine  Labs: Complete   Blood Count (CBC), Complete Metabolic Panel (CMP), Cholesterol (Lipid Panel) °Every 6-12 months based on history and medications °May be recommended more frequently based on current conditions or previous results °Hemoglobin A1c Lab °Every 3-12 months based on history and previous results °Starting at age 45 or earlier with diagnosis of diabetes, high cholesterol, BMI >26, and/or risk factors °Frequent monitoring for patients with diabetes to ensure blood sugar control °Thyroid Panel (TSH w/ T3 & T4) °Every 6 months based on history, symptoms, and risk factors °May be repeated more often if on medication °HIV °One time testing for all patients 13 and older °May be repeated more frequently for patients with increased risk factors or  exposure °Hepatitis C °One time testing for all patients 18 and older °May be repeated more frequently for patients with increased risk factors or exposure °Gonorrhea, Chlamydia °Every 12 months for all sexually active persons 13-24 years °Additional monitoring may be recommended for those who are considered high risk or who have symptoms °PSA °Men 40-54 years old with risk factors °Additional screening may be recommended from age 55-69 based on risk factors, symptoms, and history ° °Vaccine Recommendations °Tetanus Booster °All adults every 10 years °Flu Vaccine °All patients 6 months and older every year °COVID Vaccine °All patients 12 years and older °Initial dosing with booster °May recommend additional booster based on age and health history °HPV Vaccine °2 doses all patients age 9-26 °Dosing may be considered for patients over 26 °Shingles Vaccine (Shingrix) °2 doses all adults 55 years and older °Pneumonia (Pneumovax 23) °All adults 65 years and older °May recommend earlier dosing based on health history °Pneumonia (Prevnar 13) °All adults 65 years and older °Dosed 1 year after Pneumovax 23 ° °Additional Screening, Testing, and Vaccinations may be recommended on an individualized basis based on family history, health history, risk factors, and/or exposure.  °__________________________________________________________ ° °Diet Recommendations for All Patients ° °I recommend that all patients maintain a diet low in saturated fats, carbohydrates, and cholesterol. While this can be challenging at first, it is not impossible and small changes can make big differences.  °Things to try: °Decreasing the amount of soda, sweet tea, and/or juice to one or less per day and replace with water °While water is always the first choice, if you do not like water you may consider °adding a water additive without sugar to improve the taste °other sugar free drinks °Replace potatoes with a brightly colored vegetable at dinner °Use  healthy oils, such as canola oil or olive oil, instead of butter or hard margarine °Limit your bread intake to two pieces or less a day °Replace regular pasta with low carb pasta options °Bake, broil, or grill foods instead of frying °Monitor portion sizes  °Eat smaller, more frequent meals throughout the day instead of large meals ° °An important thing to remember is, if you love foods that are not great for your health, you don't have to give them up completely. Instead, allow these foods to be a reward when you have done well. Allowing yourself to still have special treats every once in a while is a nice way to tell yourself thank you for working hard to keep yourself healthy.  ° °Also remember that every day is a new day. If you have a bad day and "fall off the wagon", you can still climb right back up and keep moving along on your journey! ° °We have resources available to help you!  °Some websites that may be helpful   include: °www.MyPlate.gov  °Www.VeryWellFit.com °_____________________________________________________________ ° °Activity Recommendations for All Patients ° °I recommend that all adults get at least 20 minutes of moderate physical activity that elevates your heart rate at least 5 days out of the week.  °Some examples include: °Walking or jogging at a pace that allows you to carry on a conversation °Cycling (stationary bike or outdoors) °Water aerobics °Yoga °Weight lifting °Dancing °If physical limitations prevent you from putting stress on your joints, exercise in a pool or seated in a chair are excellent options. ° °Do determine your MAXIMUM heart rate for activity: YOUR AGE - 220 = MAX HeartRate  ° °Remember! °Do not push yourself too hard.  °Start slowly and build up your pace, speed, weight, time in exercise, etc.  °Allow your body to rest between exercise and get good sleep. °You will need more water than normal when you are exerting yourself. Do not wait until you are thirsty to drink.  Drink with a purpose of getting in at least 8, 8 ounce glasses of water a day plus more depending on how much you exercise and sweat.  ° ° °If you begin to develop dizziness, chest pain, abdominal pain, jaw pain, shortness of breath, headache, vision changes, lightheadedness, or other concerning symptoms, stop the activity and allow your body to rest. If your symptoms are severe, seek emergency evaluation immediately. If your symptoms are concerning, but not severe, please let us know so that we can recommend further evaluation.  ° ° ° °

## 2022-04-24 NOTE — Progress Notes (Deleted)
Orma Render, DNP, AGNP-c Primary Care & Sports Medicine [address]  [place], [place] [address] [phone]  New patient visit   Patient: [name]   DOB: [birth year]   71 y.o. Female  MRN: [id number] Visit Date: 04/24/2022  Patient Care Team: Sandi Mariscal, MD as PCP - General (Internal Medicine) Diona Browner, DMD as Consulting Physician (Oral Surgery)  There were no vitals filed for this visit. There is no height or weight on file to calculate BMI.   Today's healthcare provider: Orma Render, NP   No chief complaint on file.  Subjective    [name] is a 71 y.o. female who presents today as a new patient to establish care.    Patient endorses the following concerns presently:  History reviewed and reveals the following: Past Medical History:  Diagnosis Date   Alzheimer's dementia ([facility])    Anemia    Carpal tunnel syndrome, bilateral    Chronic pain disorder 10/30/2020   Class 1 obesity due to excess calories with body mass index (BMI) of 33.0 to 33.9 in adult 10/30/2020   Depression    GERD (gastroesophageal reflux disease)    Hypercholesteremia    Prolonged Q-T interval on ECG 06/10/2020   Schizophrenia ([facility])    Past Surgical History:  Procedure Laterality Date   ABDOMINAL HYSTERECTOMY     ALVEOLOPLASTY Bilateral 08/20/2020   Procedure: ALVEOLOPLASTY;  Surgeon: Diona Browner, DDS;  Location: [facility];  Service: Oral Surgery;  Laterality: Bilateral;   CARPAL TUNNEL RELEASE     COLONOSCOPY     INCISION AND DRAINAGE ABSCESS N/A 10/30/2020   Procedure: INCISION AND DRAINAGE SUBMENTAL SUBLINGUAL SPACE INFECTION, DEBRIDEMENT MANDIBLE;  Surgeon: Diona Browner, DMD;  Location: [facility];  Service: Oral Surgery;  Laterality: N/A;   MANDIBLE SURGERY     ORIF ANKLE FRACTURE Right 10/01/2020   Procedure: OPEN REDUCTION INTERNAL FIXATION (ORIF) ANKLE FRACTURE;  Surgeon: Nicholes Stairs, MD;  Location: [facility];   Service: Orthopedics;  Laterality: Right;   TUMOR REMOVAL     on back   No family status information on file.   No family history on file. Social History   Socioeconomic History   Marital status: Single    Spouse name: Not on file   Number of children: Not on file   Years of education: Not on file   Highest education level: Not on file  Occupational History   Not on file  Tobacco Use   Smoking status: Never   Smokeless tobacco: Never  Vaping Use   Vaping Use: Never used  Substance and Sexual Activity   Alcohol use: No   Drug use: No   Sexual activity: Not on file  Other Topics Concern   Not on file  Social History Narrative   Not on file   Social Determinants of Health   Financial Resource Strain: Not on file  Food Insecurity: Not on file  Transportation Needs: Not on file  Physical Activity: Not on file  Stress: Not on file  Social Connections: Not on file   Outpatient Medications Prior to Visit  Medication Sig   aspirin EC 81 MG tablet Take 81 mg by mouth every morning.   BIOTIN PO Take 1 tablet by mouth every morning.   buPROPion (WELLBUTRIN XL) 300 MG 24 hr tablet Take 300 mg by mouth at bedtime.   Cholecalciferol (VITAMIN D3) 50 MCG (2000 UT) TABS Take 2,000 Units by mouth  daily.   citalopram (CELEXA) 40 MG tablet Take 40 mg by mouth daily.   fish oil-omega-3 fatty acids 1000 MG capsule Take 1,000 mg by mouth daily.   gabapentin (NEURONTIN) 300 MG capsule Take 300 mg by mouth at bedtime.   galantamine (RAZADYNE) 12 MG tablet Take 12 mg by mouth 2 (two) times daily.   hydrOXYzine (VISTARIL) 50 MG capsule Take 50 mg by mouth 3 (three) times daily as needed for anxiety.    ibuprofen (ADVIL) 600 MG tablet Take 1 tablet (600 mg total) by mouth every 6 (six) hours as needed. (Patient taking differently: Take 400 mg by mouth every 6 (six) hours as needed for mild pain or moderate pain.)   methocarbamol (ROBAXIN) 500 MG tablet Take 1 tablet (500 mg total) by mouth  every 6 (six) hours as needed for muscle spasms.   omeprazole (PRILOSEC) 40 MG capsule Take 40 mg by mouth in the morning and at bedtime.   oxyCODONE-acetaminophen (PERCOCET/ROXICET) 5-325 MG tablet Take 0.5 tablets by mouth 4 (four) times daily as needed for severe pain.   QUEtiapine (SEROQUEL XR) 300 MG 24 hr tablet Take 600 mg by mouth at bedtime.   ramelteon (ROZEREM) 8 MG tablet Take 8 mg by mouth at bedtime.   simvastatin (ZOCOR) 80 MG tablet Take 80 mg by mouth at bedtime.   tiZANidine (ZANAFLEX) 4 MG tablet Take 4 mg by mouth 3 (three) times daily as needed for muscle spasms.   No facility-administered medications prior to visit.   Allergies  Allergen Reactions   Donepezil Other (See Comments)    Other reaction(s): CHEST PAINS & THRUSH,PALPITATIONS  Pt denies   Estrogens Other (See Comments)    Other reaction(s): HRT-DVT Patient un aware   Immunization History  Administered Date(s) Administered   Tdap 11/18/2012    Review of Systems All review of systems negative except what is listed in the HPI   Objective    There were no vitals taken for this visit. Physical Exam  No results found for any visits on 04/24/22.  Assessment & Plan      Problem List Items Addressed This Visit   None    No follow-ups on file.      Dwaine Pringle, Coralee Pesa, NP, DNP, AGNP-C Primary Care & Sports Medicine at [facility]

## 2022-06-15 ENCOUNTER — Ambulatory Visit (HOSPITAL_BASED_OUTPATIENT_CLINIC_OR_DEPARTMENT_OTHER): Payer: Medicare Other | Admitting: Nurse Practitioner

## 2022-06-22 ENCOUNTER — Encounter (HOSPITAL_BASED_OUTPATIENT_CLINIC_OR_DEPARTMENT_OTHER): Payer: Self-pay | Admitting: Nurse Practitioner

## 2023-01-19 ENCOUNTER — Other Ambulatory Visit (HOSPITAL_COMMUNITY): Payer: Self-pay | Admitting: Geriatric Medicine

## 2023-01-19 DIAGNOSIS — K449 Diaphragmatic hernia without obstruction or gangrene: Secondary | ICD-10-CM

## 2023-02-04 ENCOUNTER — Other Ambulatory Visit (HOSPITAL_COMMUNITY): Payer: Medicare Other

## 2023-02-16 ENCOUNTER — Other Ambulatory Visit (HOSPITAL_COMMUNITY): Payer: Self-pay

## 2023-02-16 ENCOUNTER — Ambulatory Visit (HOSPITAL_COMMUNITY): Admission: RE | Admit: 2023-02-16 | Payer: 59 | Source: Ambulatory Visit

## 2023-03-02 ENCOUNTER — Ambulatory Visit (HOSPITAL_COMMUNITY): Payer: 59

## 2023-03-09 ENCOUNTER — Ambulatory Visit (INDEPENDENT_AMBULATORY_CARE_PROVIDER_SITE_OTHER): Payer: 59 | Admitting: Podiatry

## 2023-03-09 DIAGNOSIS — Z91199 Patient's noncompliance with other medical treatment and regimen due to unspecified reason: Secondary | ICD-10-CM

## 2023-03-10 ENCOUNTER — Ambulatory Visit (HOSPITAL_COMMUNITY)
Admission: RE | Admit: 2023-03-10 | Discharge: 2023-03-10 | Disposition: A | Discharge status: Home / Self Care | Payer: 59 | Source: Ambulatory Visit | Attending: Geriatric Medicine | Admitting: Geriatric Medicine

## 2023-03-10 ENCOUNTER — Other Ambulatory Visit (HOSPITAL_COMMUNITY): Payer: Self-pay | Admitting: Geriatric Medicine

## 2023-03-10 DIAGNOSIS — K449 Diaphragmatic hernia without obstruction or gangrene: Secondary | ICD-10-CM

## 2023-03-11 NOTE — Progress Notes (Signed)
Patient was no-show for appointment today 

## 2023-04-12 ENCOUNTER — Other Ambulatory Visit (HOSPITAL_COMMUNITY): Payer: Self-pay | Admitting: General Surgery

## 2023-04-12 ENCOUNTER — Ambulatory Visit: Payer: Self-pay | Admitting: General Surgery

## 2023-04-12 DIAGNOSIS — K449 Diaphragmatic hernia without obstruction or gangrene: Secondary | ICD-10-CM

## 2023-04-12 NOTE — H&P (Signed)
Chief Complaint: New Consultation (Large Hiatal hernia)       History of Present Illness: [name] is a 72 y.o. female who is seen today as an office consultation at the request of Dr. Gwendalyn Ege for evaluation of New Consultation (Large Hiatal hernia) .   Patient is a 72 year old female who comes in secondary to a hiatal hernia.  Patient has recently undergone a screening endoscopy and colonoscopy secondary to chronic anemia. Patient was found to have a large hiatal hernia.  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 Patient did undergo upper GI which revealed large hiatal hernia.   She does state that she has dysphagia as well as reflux at night.  She states that she sleeps on her side.   Upon reviewing her laboratory studies patient with hematocrit of 28.5 with an MCV of 75.   Patient states that she had no previous abdominal surgery.       Review of Systems: A complete review of systems was obtained from the patient.  I have reviewed this information and discussed as appropriate with the patient.  See HPI as well for other ROS.   Review of Systems  Constitutional:  Negative for fever.  HENT:  Negative for congestion.   Eyes:  Negative for blurred vision.  Respiratory:  Negative for cough, shortness of breath and wheezing.   Cardiovascular:  Negative for chest pain and palpitations.  Gastrointestinal:  Positive for heartburn.  Genitourinary:  Negative for dysuria.  Musculoskeletal:  Negative for myalgias.  Skin:  Negative for rash.  Neurological:  Negative for dizziness and headaches.  Psychiatric/Behavioral:  Negative for depression and suicidal ideas.   All other systems reviewed and are negative.       Medical History: Past Medical History Past Medical History: Diagnosis Date  Anemia    Arthritis    GERD (gastroesophageal reflux disease)    Hyperlipidemia         Problem List There is no problem list on file for this  patient.     Past Surgical History Past Surgical History: Procedure Laterality Date   Orif ankle fracture (Right)        Alveoloplasty (Bilateral)      HYSTERECTOMY      Mandible surgery          Allergies Allergies Allergen Reactions  Donepezil Other (See Comments)     Other reaction(s): CHEST PAINS & THRUSH,PALPITATIONS   Other reaction(s): CHEST PAINS & THRUSH,PALPITATIONS    Pt denies  Estrogens Other (See Comments)     Other reaction(s): HRT-DVT   Other reaction(s): HRT-DVT  Patient un aware  Gabapentin Nausea And Vomiting      Medications Ordered Prior to Encounter Current Outpatient Medications on File Prior to Visit Medication Sig Dispense Refill  aspirin 81 MG EC tablet Take 81 mg by mouth every morning      buPROPion (WELLBUTRIN XL) 300 MG XL tablet TAKE 1 TABLET BY MOUTH IN THE MORNING FOR DEPRESSION/ANXIETY.      citalopram (CELEXA) 40 MG tablet TAKE 1 TABLET BY MOUTH IN THE EVENING FOR DEPRESSION/ANXIETY.      gabapentin (NEURONTIN) 300 MG capsule Take 300 mg by mouth at bedtime      galantamine (RAZADYNE) 12 MG tablet Take 12 mg by mouth 2 (two) times daily      omeprazole (PRILOSEC) 40 MG DR capsule Take by mouth      oxyCODONE-acetaminophen (PERCOCET) 5-325 mg tablet  Take 1 tablet by mouth 4 (four) times daily as needed      QUEtiapine (SEROQUEL) 300 MG tablet TAKE 2 TABLETS BY MOUTH AT BEDTIME FOR MOOD/SLEEP      ramelteon (ROZEREM) 8 mg tablet Take 8 mg by mouth at bedtime as needed for Sleep      simvastatin (ZOCOR) 80 MG tablet Take 80 mg by mouth at bedtime        No current facility-administered medications on file prior to visit.      Family History Family History Problem Relation Age of Onset  Colon cancer Brother        Tobacco Use History Social History    Tobacco Use Smoking Status Never Smokeless Tobacco Never      Social History Social History    Socioeconomic History  Marital status: Single Tobacco Use  Smoking  status: Never  Smokeless tobacco: Never Vaping Use  Vaping status: Never Used Substance and Sexual Activity  Alcohol use: Not Currently  Drug use: Never      Objective:     Vitals:   04/12/23 1131 BP: 134/72 Pulse: 101 Temp: 36.9 C (98.5 F) SpO2: 97% Weight: 99.2 kg (218 lb 9.6 oz) Height: 165.1 cm (5\' 5" ) PainSc: 0-No pain   Body mass index is 36.38 kg/m.   Physical Exam Constitutional:      Appearance: Normal appearance.  HENT:     Head: Normocephalic and atraumatic.     Mouth/Throat:     Mouth: Mucous membranes are moist.     Pharynx: Oropharynx is clear.  Eyes:     General: No scleral icterus.    Pupils: Pupils are equal, round, and reactive to light.  Cardiovascular:     Rate and Rhythm: Normal rate and regular rhythm.     Pulses: Normal pulses.     Heart sounds: No murmur heard.    No friction rub. No gallop.  Pulmonary:     Effort: Pulmonary effort is normal. No respiratory distress.     Breath sounds: Normal breath sounds. No stridor.  Abdominal:     General: Abdomen is flat.  Musculoskeletal:        General: No swelling.  Skin:    General: Skin is warm.  Neurological:     General: No focal deficit present.     Mental Status: She is alert and oriented to person, place, and time. Mental status is at baseline.  Psychiatric:        Mood and Affect: Mood normal.        Thought Content: Thought content normal.        Judgment: Judgment normal.        Assessment and Plan: Diagnoses and all orders for this visit:   Hiatal hernia   Chronic [name] ulcer   Symptomatic cholelithiasis     [name] is a 72 y.o. female    1.  We will proceed to the OR for a robotic hiatal hernia repair with mesh and fundoplication 2. All risks and benefits were discussed with the patient, to generally include infection, bleeding, damage to surrounding structures, possible pneumothorax, and recurrence. Alternatives were offered and described.  All questions  were answered and the patient voiced understanding of the procedure and wishes to proceed at this point.

## 2023-04-29 ENCOUNTER — Ambulatory Visit (HOSPITAL_COMMUNITY): Payer: 59

## 2023-05-06 ENCOUNTER — Ambulatory Visit (HOSPITAL_COMMUNITY): Payer: 59

## 2023-05-14 ENCOUNTER — Ambulatory Visit (HOSPITAL_COMMUNITY)
Admission: RE | Admit: 2023-05-14 | Discharge: 2023-05-14 | Disposition: A | Discharge status: Home / Self Care | Payer: 59 | Source: Ambulatory Visit | Attending: General Surgery | Admitting: General Surgery

## 2023-05-14 DIAGNOSIS — K449 Diaphragmatic hernia without obstruction or gangrene: Secondary | ICD-10-CM

## 2023-05-18 NOTE — Pre-Procedure Instructions (Signed)
Surgical Instructions    Your procedure is scheduled on Tuesday, July 16th.  Report to [facility] Main Entrance "A" at 05:30 A.M., then check in with the Admitting office.  Call this number if you have problems the morning of surgery:  [phone]  If you have any questions prior to your surgery date call [phone]: Open Monday-Friday 8am-4pm If you experience any cold or flu symptoms such as cough, fever, chills, shortness of breath, etc. between now and your scheduled surgery, please notify us at the above number.     Remember:  Do not eat after midnight the night before your surgery  You may drink clear liquids until 04:30 AM the morning of your surgery.   Clear liquids allowed are: Water, Non-Citrus Juices (without pulp), Carbonated Beverages, Clear Tea, Black Coffee Only (NO MILK, CREAM OR POWDERED CREAMER of any kind), and Gatorade.   Patient Instructions  The night before surgery:  No food after midnight. ONLY clear liquids after midnight  The day of surgery (if you do NOT have diabetes):  Drink ONE (1) Pre-Surgery Clear Ensure by 04:30 AM the morning of surgery. Drink in one sitting. Do not sip.  This drink was given to you during your hospital  pre-op appointment visit.  Nothing else to drink after completing the  Pre-Surgery Clear Ensure.          If you have questions, please contact your surgeon's office.     Take these medicines the morning of surgery with A SIP OF WATER  citalopram (CELEXA)  galantamine (RAZADYNE)  omeprazole (PRILOSEC)   If needed: hydrOXYzine (VISTARIL)  oxybutynin (DITROPAN)  propranolol (INDERAL)  tiZANidine (ZANAFLEX)    Follow your surgeon's instructions on when to stop Aspirin.  If no instructions were given by your surgeon then you will need to call the office to get those instructions.     As of today, STOP taking any Aleve, Naproxen, Ibuprofen, Motrin, Advil, Goody's, BC's, all herbal medications, fish oil, and all  vitamins.                     Do NOT Smoke (Tobacco/Vaping) for 24 hours prior to your procedure.  If you use a CPAP at night, you may bring your mask/headgear for your overnight stay.   Contacts, glasses, piercing's, hearing aid's, dentures or partials may not be worn into surgery, please bring cases for these belongings.    For patients admitted to the hospital, discharge time will be determined by your treatment team.   Patients discharged the day of surgery will not be allowed to drive home, and someone needs to stay with them for 24 hours.  SURGICAL WAITING ROOM VISITATION Patients having surgery or a procedure may have no more than 2 support people in the waiting area - these visitors may rotate.   Children under the age of 74 must have an adult with them who is not the patient. If the patient needs to stay at the hospital during part of their recovery, the visitor guidelines for inpatient rooms apply. Pre-op nurse will coordinate an appropriate time for 1 support person to accompany patient in pre-op.  This support person may not rotate.   Please refer to the [facility] website for the visitor guidelines for Inpatients (after your surgery is over and you are in a regular room).    Special instructions:   [place]- Preparing For Surgery  Before surgery, you can play an important role. Because skin is not sterile,  your skin needs to be as free of germs as possible. You can reduce the number of germs on your skin by washing with CHG (chlorahexidine gluconate) Soap before surgery.  CHG is an antiseptic cleaner which kills germs and bonds with the skin to continue killing germs even after washing.    Oral Hygiene is also important to reduce your risk of infection.  Remember - BRUSH YOUR TEETH THE MORNING OF SURGERY WITH YOUR REGULAR TOOTHPASTE  Please do not use if you have an allergy to CHG or antibacterial soaps. If your skin becomes reddened/irritated stop using the CHG.  Do not  shave (including legs and underarms) for at least 48 hours prior to first CHG shower. It is OK to shave your face.  Please follow these instructions carefully.   Shower the NIGHT BEFORE SURGERY and the MORNING OF SURGERY  If you chose to wash your hair, wash your hair first as usual with your normal shampoo.  After you shampoo, rinse your hair and body thoroughly to remove the shampoo.  Use CHG Soap as you would any other liquid soap. You can apply CHG directly to the skin and wash gently with a scrungie or a clean washcloth.   Apply the CHG Soap to your body ONLY FROM THE NECK DOWN.  Do not use on open wounds or open sores. Avoid contact with your eyes, ears, mouth and genitals (private parts). Wash Face and genitals (private parts)  with your normal soap.   Wash thoroughly, paying special attention to the area where your surgery will be performed.  Thoroughly rinse your body with warm water from the neck down.  DO NOT shower/wash with your normal soap after using and rinsing off the CHG Soap.  Pat yourself dry with a CLEAN TOWEL.  Wear CLEAN PAJAMAS to bed the night before surgery  Place CLEAN SHEETS on your bed the night before your surgery  DO NOT SLEEP WITH PETS.   Day of Surgery: Take a shower with CHG soap. Do not wear jewelry or makeup Do not wear lotions, powders, perfumes/colognes, or deodorant. Do not shave 48 hours prior to surgery.  Men may shave face and neck. Do not bring valuables to the hospital.  [facility] is not responsible for any belongings or valuables. Do not wear nail polish, gel polish, artificial nails, or any other type of covering on natural nails (fingers and toes) If you have artificial nails or gel coating that need to be removed by a nail salon, please have this removed prior to surgery. Artificial nails or gel coating may interfere with anesthesia's ability to adequately monitor your vital signs. Wear Clean/Comfortable clothing the morning of  surgery Remember to brush your teeth WITH YOUR REGULAR TOOTHPASTE.   Please read over the following fact sheets that you were given.    If you received a COVID test during your pre-op visit  it is requested that you wear a mask when out in public, stay away from anyone that may not be feeling well and notify your surgeon if you develop symptoms. If you have been in contact with anyone that has tested positive in the last 10 days please notify you surgeon.

## 2023-05-19 ENCOUNTER — Encounter (HOSPITAL_COMMUNITY): Payer: Self-pay

## 2023-05-19 ENCOUNTER — Other Ambulatory Visit: Payer: Self-pay

## 2023-05-19 ENCOUNTER — Encounter (HOSPITAL_COMMUNITY)
Admission: RE | Admit: 2023-05-19 | Discharge: 2023-05-19 | Disposition: A | Discharge status: Home / Self Care | Payer: 59 | Source: Ambulatory Visit | Attending: General Surgery | Admitting: General Surgery

## 2023-05-19 VITALS — BP 110/57 | HR 75 | Temp 98.5°F | Resp 18 | Ht 65.0 in | Wt 217.0 lb

## 2023-05-19 DIAGNOSIS — Z01818 Encounter for other preprocedural examination: Secondary | ICD-10-CM | POA: Diagnosis present

## 2023-05-19 HISTORY — DX: Personal history of other diseases of the digestive system: Z87.19

## 2023-05-19 HISTORY — DX: Fatty (change of) liver, not elsewhere classified: K76.0

## 2023-05-19 LAB — COMPREHENSIVE METABOLIC PANEL
ALT: 23 U/L (ref 0–44)
AST: 25 U/L (ref 15–41)
Albumin: 3.9 g/dL (ref 3.5–5.0)
Alkaline Phosphatase: 72 U/L (ref 38–126)
Anion gap: 6 (ref 5–15)
BUN: 16 mg/dL (ref 8–23)
CO2: 25 mmol/L (ref 22–32)
Calcium: 9.4 mg/dL (ref 8.9–10.3)
Chloride: 105 mmol/L (ref 98–111)
Creatinine, Ser: 0.87 mg/dL (ref 0.44–1.00)
GFR, Estimated: >60 mL/min (ref >60)
Glucose, Bld: 113 mg/dL — ABNORMAL HIGH (ref 70–99)
Potassium: 4.2 mmol/L (ref 3.5–5.1)
Sodium: 136 mmol/L (ref 135–145)
Total Bilirubin: 0.5 mg/dL (ref 0.3–1.2)
Total Protein: 6.9 g/dL (ref 6.5–8.1)

## 2023-05-19 LAB — CBC
HCT: 35.7 % — ABNORMAL LOW (ref 36.0–46.0)
Hemoglobin: 10.2 g/dL — ABNORMAL LOW (ref 12.0–15.0)
MCH: 23.2 pg — ABNORMAL LOW (ref 26.0–34.0)
MCHC: 28.6 g/dL — ABNORMAL LOW (ref 30.0–36.0)
MCV: 81.1 fL (ref 80.0–100.0)
Platelets: 324 10*3/uL (ref 150–400)
RBC: 4.4 MIL/uL (ref 3.87–5.11)
RDW: 26.2 % — ABNORMAL HIGH (ref 11.5–15.5)
WBC: 4.5 10*3/uL (ref 4.0–10.5)
nRBC: 0 % (ref 0.0–0.2)

## 2023-05-19 LAB — SURGICAL PCR SCREEN
MRSA, PCR: POSITIVE — AB
Staphylococcus aureus: POSITIVE — AB

## 2023-05-19 NOTE — Progress Notes (Signed)
PCP - Dr. Wynelle Link Cardiologist - denies  Chest x-ray - n/a EKG - n/a Stress Test - denies  ECHO - denies Cardiac Cath - denies  Sleep Study - denies CPAP -   Aspirin Instructions: will stop  ERAS Protcol - PRE-SURGERY Ensure or G2- Ensure provided  COVID TEST- n/a  Patient denies shortness of breath, fever, cough and chest pain at PAT appointment  Pt with hx dementia. A&O x4, answered all orientation questions appropriately. Reports she makes all legal decisions. Son in waiting area, offered for son to attend appointment, but pt denied.

## 2023-05-20 NOTE — Progress Notes (Signed)
Notified Wendy at Dr. Jacinto Halim office  that patient's surgical PCR came back positive for MRSA and staph. 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 Per Toniann Fail, Dr. Derrell Lolling is out of the office this week. She will inform him of PCR results on Monday.

## 2023-05-25 DIAGNOSIS — Z01818 Encounter for other preprocedural examination: Secondary | ICD-10-CM

## 2023-07-02 NOTE — Pre-Procedure Instructions (Signed)
Surgical Instructions   Your procedure is scheduled on July 13, 2023. Report to [facility] Main Entrance "A" at 5:30 A.M., then check in with the Admitting office. Any questions or running late day of surgery: call [phone]  Questions prior to your surgery date: call [phone], Monday-Friday, 8am-4pm. If you experience any cold or flu symptoms such as cough, fever, chills, shortness of breath, etc. between now and your scheduled surgery, please notify us at the above number.     Remember:  Do not eat after midnight the night before your surgery   You may drink clear liquids until 4:30 AM the morning of your surgery.   Clear liquids allowed are: Water, Non-Citrus Juices (without pulp), Carbonated Beverages, Clear Tea, Black Coffee Only (NO MILK, CREAM OR POWDERED CREAMER of any kind), and Gatorade.  Patient Instructions  The night before surgery:  No food after midnight. ONLY clear liquids after midnight  The day of surgery (if you do NOT have diabetes):  Drink ONE (1) Pre-Surgery Clear Ensure by 4:30 AM the morning of surgery. Drink in one sitting. Do not sip.  This drink was given to you during your hospital  pre-op appointment visit.  Nothing else to drink after completing the  Pre-Surgery Clear Ensure.         If you have questions, please contact your surgeon's office.     Take these medicines the morning of surgery with A SIP OF WATER: citalopram (CELEXA)  galantamine (RAZADYNE)  omeprazole (PRILOSEC)    May take these medicines IF NEEDED: hydrOXYzine (VISTARIL)  oxybutynin (DITROPAN)  propranolol (INDERAL)  tiZANidine (ZANAFLEX)    Follow your surgeon's instructions on when to stop Aspirin.  If no instructions were given by your surgeon then you will need to call the office to get those instructions.     One week prior to surgery, STOP taking any Aleve, Naproxen, Ibuprofen, Motrin, Advil, Goody's, BC's, all herbal medications, fish oil, and  non-prescription vitamins.                     Do NOT Smoke (Tobacco/Vaping) for 24 hours prior to your procedure.  If you use a CPAP at night, you may bring your mask/headgear for your overnight stay.   You will be asked to remove any contacts, glasses, piercing's, hearing aid's, dentures/partials prior to surgery. Please bring cases for these items if needed.    Patients discharged the day of surgery will not be allowed to drive home, and someone needs to stay with them for 24 hours.  SURGICAL WAITING ROOM VISITATION Patients may have no more than 2 support people in the waiting area - these visitors may rotate.   Pre-op nurse will coordinate an appropriate time for 1 ADULT support person, who may not rotate, to accompany patient in pre-op.  Children under the age of 55 must have an adult with them who is not the patient and must remain in the main waiting area with an adult.  If the patient needs to stay at the hospital during part of their recovery, the visitor guidelines for inpatient rooms apply.  Please refer to the [facility] website for the visitor guidelines for any additional information.   If you received a COVID test during your pre-op visit  it is requested that you wear a mask when out in public, stay away from anyone that may not be feeling well and notify your surgeon if you develop symptoms. If you have been in contact  with anyone that has tested positive in the last 10 days please notify you surgeon.      Pre-operative CHG Bathing Instructions   You can play a key role in reducing the risk of infection after surgery. Your skin needs to be as free of germs as possible. You can reduce the number of germs on your skin by washing with CHG (chlorhexidine gluconate) soap before surgery. CHG is an antiseptic soap that kills germs and continues to kill germs even after washing.   DO NOT use if you have an allergy to chlorhexidine/CHG or antibacterial soaps. If your skin  becomes reddened or irritated, stop using the CHG and notify one of our RNs at [phone].              TAKE A SHOWER THE NIGHT BEFORE SURGERY AND THE DAY OF SURGERY    Please keep in mind the following:  DO NOT shave, including legs and underarms, 48 hours prior to surgery.   You may shave your face before/day of surgery.  Place clean sheets on your bed the night before surgery Use a clean washcloth (not used since being washed) for each shower. DO NOT sleep with pet's night before surgery.  CHG Shower Instructions:  If you choose to wash your hair and private area, wash first with your normal shampoo/soap.  After you use shampoo/soap, rinse your hair and body thoroughly to remove shampoo/soap residue.  Turn the water OFF and apply half the bottle of CHG soap to a CLEAN washcloth.  Apply CHG soap ONLY FROM YOUR NECK DOWN TO YOUR TOES (washing for 3-5 minutes)  DO NOT use CHG soap on face, private areas, open wounds, or sores.  Pay special attention to the area where your surgery is being performed.  If you are having back surgery, having someone wash your back for you may be helpful. Wait 2 minutes after CHG soap is applied, then you may rinse off the CHG soap.  Pat dry with a clean towel  Put on clean pajamas    Additional instructions for the day of surgery: DO NOT APPLY any lotions, deodorants, cologne, or perfumes.   Do not wear jewelry or makeup Do not wear nail polish, gel polish, artificial nails, or any other type of covering on natural nails (fingers and toes) Do not bring valuables to the hospital. [facility] is not responsible for valuables/personal belongings. Put on clean/comfortable clothes.  Please brush your teeth.  Ask your nurse before applying any prescription medications to the skin.

## 2023-07-05 ENCOUNTER — Encounter (HOSPITAL_COMMUNITY)
Admission: RE | Admit: 2023-07-05 | Discharge: 2023-07-05 | Disposition: A | Discharge status: Home / Self Care | Payer: 59 | Source: Ambulatory Visit | Attending: General Surgery | Admitting: General Surgery

## 2023-07-05 ENCOUNTER — Encounter (HOSPITAL_COMMUNITY): Payer: Self-pay

## 2023-07-05 ENCOUNTER — Other Ambulatory Visit: Payer: Self-pay

## 2023-07-05 VITALS — BP 129/94 | HR 75 | Temp 98.7°F | Resp 16 | Ht 65.0 in

## 2023-07-05 DIAGNOSIS — Z01818 Encounter for other preprocedural examination: Secondary | ICD-10-CM | POA: Insufficient documentation

## 2023-07-05 LAB — COMPREHENSIVE METABOLIC PANEL
ALT: 21 U/L (ref 0–44)
AST: 22 U/L (ref 15–41)
Albumin: 4.1 g/dL (ref 3.5–5.0)
Alkaline Phosphatase: 68 U/L (ref 38–126)
Anion gap: 12 (ref 5–15)
BUN: 21 mg/dL (ref 8–23)
CO2: 22 mmol/L (ref 22–32)
Calcium: 9.8 mg/dL (ref 8.9–10.3)
Chloride: 104 mmol/L (ref 98–111)
Creatinine, Ser: 1.19 mg/dL — ABNORMAL HIGH (ref 0.44–1.00)
GFR, Estimated: 48 mL/min — ABNORMAL LOW (ref >60)
Glucose, Bld: 104 mg/dL — ABNORMAL HIGH (ref 70–99)
Potassium: 4.1 mmol/L (ref 3.5–5.1)
Sodium: 138 mmol/L (ref 135–145)
Total Bilirubin: 0.4 mg/dL (ref 0.3–1.2)
Total Protein: 7 g/dL (ref 6.5–8.1)

## 2023-07-05 LAB — CBC
HCT: 46.7 % — ABNORMAL HIGH (ref 36.0–46.0)
Hemoglobin: 14.5 g/dL (ref 12.0–15.0)
MCH: 28 pg (ref 26.0–34.0)
MCHC: 31 g/dL (ref 30.0–36.0)
MCV: 90.2 fL (ref 80.0–100.0)
Platelets: 241 10*3/uL (ref 150–400)
RBC: 5.18 MIL/uL — ABNORMAL HIGH (ref 3.87–5.11)
RDW: 22.9 % — ABNORMAL HIGH (ref 11.5–15.5)
WBC: 4.5 10*3/uL (ref 4.0–10.5)
nRBC: 0 % (ref 0.0–0.2)

## 2023-07-05 LAB — TYPE AND SCREEN
ABO/RH(D): A NEG
Antibody Screen: NEGATIVE

## 2023-07-05 LAB — SURGICAL PCR SCREEN
MRSA, PCR: NEGATIVE
Staphylococcus aureus: NEGATIVE

## 2023-07-05 NOTE — Progress Notes (Signed)
PCP - Dr. Salli Real Cardiologist - denies  PPM/ICD - denies Device Orders -  Rep Notified -   Chest x-ray - na EKG - na Stress Test - denies ECHO - denies Cardiac Cath - denies  Sleep Study - denies CPAP - no  Fasting Blood Sugar - na Checks Blood Sugar _____ times a day  Last dose of GLP1 agonist- na  GLP1 instructions:   Blood Thinner Instructions:na Aspirin Instructions:pt states she received instructions from Dr. Jacinto Halim office regarding when to stop aspirin.   ERAS Protcol -clear liquids until 0430 PRE-SURGERY Ensure or G2- Ensure  COVID TEST- na   Anesthesia review: no  Patient denies shortness of breath, fever, cough and chest pain at PAT appointment. Pt alert and oriented x4 at PAT appointment and answered all questions appropriately. She verbalized understanding of her upcoming procedure and states she is ready to "get it over with". She said she and her son would go over the instructions again at home.    All instructions explained to the patient, with a verbal understanding of the material. Patient agrees to go over the instructions while at home for a better understanding. Patient also instructed to wear a mask when out in public prior to surgery. The opportunity to ask questions was provided.

## 2023-07-13 ENCOUNTER — Other Ambulatory Visit: Payer: Self-pay

## 2023-07-13 ENCOUNTER — Ambulatory Visit (HOSPITAL_BASED_OUTPATIENT_CLINIC_OR_DEPARTMENT_OTHER): Payer: 59 | Admitting: Anesthesiology

## 2023-07-13 ENCOUNTER — Observation Stay (HOSPITAL_COMMUNITY)
Admission: RE | Admit: 2023-07-13 | Discharge: 2023-07-15 | Disposition: A | Discharge status: Home / Self Care | Payer: 59 | Attending: General Surgery | Admitting: General Surgery

## 2023-07-13 ENCOUNTER — Encounter (HOSPITAL_COMMUNITY)
Admission: RE | Disposition: A | Discharge status: Home / Self Care | Payer: Self-pay | Source: Home / Self Care | Attending: General Surgery

## 2023-07-13 ENCOUNTER — Ambulatory Visit (HOSPITAL_COMMUNITY): Payer: 59 | Admitting: Physician Assistant

## 2023-07-13 ENCOUNTER — Encounter (HOSPITAL_COMMUNITY): Payer: Self-pay | Admitting: General Surgery

## 2023-07-13 DIAGNOSIS — K257 Chronic gastric ulcer without hemorrhage or perforation: Secondary | ICD-10-CM | POA: Diagnosis not present

## 2023-07-13 DIAGNOSIS — K259 Gastric ulcer, unspecified as acute or chronic, without hemorrhage or perforation: Secondary | ICD-10-CM

## 2023-07-13 DIAGNOSIS — K449 Diaphragmatic hernia without obstruction or gangrene: Principal | ICD-10-CM | POA: Insufficient documentation

## 2023-07-13 DIAGNOSIS — Z6835 Body mass index (BMI) 35.0-35.9, adult: Secondary | ICD-10-CM

## 2023-07-13 DIAGNOSIS — Z79899 Other long term (current) drug therapy: Secondary | ICD-10-CM | POA: Diagnosis not present

## 2023-07-13 DIAGNOSIS — E7800 Pure hypercholesterolemia, unspecified: Secondary | ICD-10-CM

## 2023-07-13 DIAGNOSIS — Z7982 Long term (current) use of aspirin: Secondary | ICD-10-CM | POA: Diagnosis not present

## 2023-07-13 DIAGNOSIS — Z01818 Encounter for other preprocedural examination: Principal | ICD-10-CM

## 2023-07-13 DIAGNOSIS — Z8719 Personal history of other diseases of the digestive system: Secondary | ICD-10-CM

## 2023-07-13 HISTORY — PX: XI ROBOTIC ASSISTED HIATAL HERNIA REPAIR: SHX6889

## 2023-07-13 LAB — ABO/RH: ABO/RH(D): A NEG

## 2023-07-13 SURGERY — REPAIR, HERNIA, HIATAL, ROBOT-ASSISTED
Anesthesia: General

## 2023-07-13 MED ORDER — PROPOFOL 10 MG/ML IV BOLUS
INTRAVENOUS | Status: DC | PRN
  Administered 2023-07-13: 50 mg via INTRAVENOUS
  Administered 2023-07-13: 200 mg via INTRAVENOUS
  Administered 2023-07-13: 50 mg via INTRAVENOUS

## 2023-07-13 MED ORDER — FENTANYL CITRATE (PF) 100 MCG/2ML IJ SOLN
25.0000 ug | INTRAMUSCULAR | Status: DC | PRN
  Administered 2023-07-13: 50 ug via INTRAVENOUS
  Administered 2023-07-13 (×2): 25 ug via INTRAVENOUS
  Administered 2023-07-13: 50 ug via INTRAVENOUS

## 2023-07-13 MED ORDER — ONDANSETRON HCL 4 MG/2ML IJ SOLN
INTRAMUSCULAR | Status: DC | PRN
  Administered 2023-07-13: 4 mg via INTRAVENOUS

## 2023-07-13 MED ORDER — LABETALOL HCL 5 MG/ML IV SOLN
INTRAVENOUS | Status: AC
  Filled 2023-07-13: qty 4

## 2023-07-13 MED ORDER — ORAL CARE MOUTH RINSE: 15.0000 mL | Freq: Once | OROMUCOSAL | Status: AC

## 2023-07-13 MED ORDER — ONDANSETRON HCL 4 MG/2ML IJ SOLN: 4.0000 mg | Freq: Four times a day (QID) | INTRAMUSCULAR | Status: DC | PRN

## 2023-07-13 MED ORDER — HYDRALAZINE HCL 20 MG/ML IJ SOLN: 10.0000 mg | INTRAMUSCULAR | Status: DC | PRN

## 2023-07-13 MED ORDER — CHLORHEXIDINE GLUCONATE CLOTH 2 % EX PADS: 6.0000 | MEDICATED_PAD | Freq: Once | CUTANEOUS | Status: DC

## 2023-07-13 MED ORDER — 0.9 % SODIUM CHLORIDE (POUR BTL) OPTIME
TOPICAL | Status: DC | PRN
  Administered 2023-07-13: 1000 mL

## 2023-07-13 MED ORDER — AMISULPRIDE (ANTIEMETIC) 5 MG/2ML IV SOLN: 10.0000 mg | Freq: Once | INTRAVENOUS | Status: DC | PRN

## 2023-07-13 MED ORDER — BUPIVACAINE HCL (PF) 0.25 % IJ SOLN
INTRAMUSCULAR | Status: AC
  Filled 2023-07-13: qty 30

## 2023-07-13 MED ORDER — ONDANSETRON HCL 4 MG/2ML IJ SOLN: 4.0000 mg | Freq: Once | INTRAMUSCULAR | Status: DC | PRN

## 2023-07-13 MED ORDER — LABETALOL HCL 5 MG/ML IV SOLN
INTRAVENOUS | Status: DC | PRN
Start: 2023-07-13 — End: 2023-07-13
  Administered 2023-07-13 (×2): 10 mg via INTRAVENOUS

## 2023-07-13 MED ORDER — ACETAMINOPHEN 500 MG PO TABS
1000.0000 mg | ORAL_TABLET | ORAL | Status: AC
  Administered 2023-07-13: 1000 mg via ORAL
  Filled 2023-07-13: qty 2

## 2023-07-13 MED ORDER — KETOROLAC TROMETHAMINE 15 MG/ML IJ SOLN
INTRAMUSCULAR | Status: AC
  Filled 2023-07-13: qty 1

## 2023-07-13 MED ORDER — DEXAMETHASONE SODIUM PHOSPHATE 10 MG/ML IJ SOLN
INTRAMUSCULAR | Status: DC | PRN
  Administered 2023-07-13: 10 mg via INTRAVENOUS

## 2023-07-13 MED ORDER — HYDROCODONE-ACETAMINOPHEN 7.5-325 MG/15ML PO SOLN
10.0000 mL | ORAL | Status: DC | PRN
  Administered 2023-07-14: 10 mL via ORAL
  Filled 2023-07-13: qty 15

## 2023-07-13 MED ORDER — SUGAMMADEX SODIUM 200 MG/2ML IV SOLN
INTRAVENOUS | Status: DC | PRN
  Administered 2023-07-13: 193.2 mg via INTRAVENOUS

## 2023-07-13 MED ORDER — ROCURONIUM BROMIDE 10 MG/ML (PF) SYRINGE
PREFILLED_SYRINGE | INTRAVENOUS | Status: DC | PRN
  Administered 2023-07-13: 60 mg via INTRAVENOUS

## 2023-07-13 MED ORDER — CEFAZOLIN SODIUM-DEXTROSE 2-4 GM/100ML-% IV SOLN
2.0000 g | INTRAVENOUS | Status: AC
  Administered 2023-07-13: 2 g via INTRAVENOUS
  Filled 2023-07-13: qty 100

## 2023-07-13 MED ORDER — FENTANYL CITRATE (PF) 250 MCG/5ML IJ SOLN
INTRAMUSCULAR | Status: AC
  Filled 2023-07-13: qty 5

## 2023-07-13 MED ORDER — BUPIVACAINE LIPOSOME 1.3 % IJ SUSP
INTRAMUSCULAR | Status: AC
  Filled 2023-07-13: qty 20

## 2023-07-13 MED ORDER — ENSURE PRE-SURGERY PO LIQD: 296.0000 mL | Freq: Once | ORAL | Status: DC

## 2023-07-13 MED ORDER — FENTANYL CITRATE (PF) 100 MCG/2ML IJ SOLN
INTRAMUSCULAR | Status: AC
  Filled 2023-07-13: qty 2

## 2023-07-13 MED ORDER — KETOROLAC TROMETHAMINE 15 MG/ML IJ SOLN
15.0000 mg | Freq: Once | INTRAMUSCULAR | Status: AC
  Administered 2023-07-13: 15 mg via INTRAVENOUS

## 2023-07-13 MED ORDER — SODIUM CHLORIDE 0.9% FLUSH
INTRAVENOUS | Status: DC | PRN
  Administered 2023-07-13: 20 mL

## 2023-07-13 MED ORDER — BUPIVACAINE LIPOSOME 1.3 % IJ SUSP
INTRAMUSCULAR | Status: DC | PRN
  Administered 2023-07-13: 20 mL

## 2023-07-13 MED ORDER — LACTATED RINGERS IV SOLN
INTRAVENOUS | Status: DC | PRN
Start: 2023-07-13 — End: 2023-07-13

## 2023-07-13 MED ORDER — LACTATED RINGERS IV SOLN: INTRAVENOUS | Status: DC | PRN

## 2023-07-13 MED ORDER — VANCOMYCIN HCL IN DEXTROSE 1-5 GM/200ML-% IV SOLN
1000.0000 mg | INTRAVENOUS | Status: AC
  Administered 2023-07-13: 1000 mg via INTRAVENOUS
  Filled 2023-07-13: qty 200

## 2023-07-13 MED ORDER — FENTANYL CITRATE (PF) 250 MCG/5ML IJ SOLN
INTRAMUSCULAR | Status: DC | PRN
  Administered 2023-07-13 (×5): 50 ug via INTRAVENOUS

## 2023-07-13 MED ORDER — CHLORHEXIDINE GLUCONATE 0.12 % MT SOLN
15.0000 mL | Freq: Once | OROMUCOSAL | Status: AC
  Administered 2023-07-13: 15 mL via OROMUCOSAL
  Filled 2023-07-13: qty 15

## 2023-07-13 MED ORDER — ACETAMINOPHEN 10 MG/ML IV SOLN
INTRAVENOUS | Status: AC
  Filled 2023-07-13: qty 100

## 2023-07-13 MED ORDER — ACETAMINOPHEN 10 MG/ML IV SOLN
1000.0000 mg | Freq: Once | INTRAVENOUS | Status: DC | PRN
  Administered 2023-07-13: 1000 mg via INTRAVENOUS

## 2023-07-13 MED ORDER — DEXTROSE-SODIUM CHLORIDE 5-0.9 % IV SOLN: INTRAVENOUS | Status: DC

## 2023-07-13 MED ORDER — LACTATED RINGERS IV SOLN: INTRAVENOUS | Status: DC

## 2023-07-13 MED ORDER — HYDROMORPHONE HCL 1 MG/ML IJ SOLN
1.0000 mg | INTRAMUSCULAR | Status: DC | PRN
  Administered 2023-07-13: 2 mg via INTRAVENOUS
  Administered 2023-07-13 – 2023-07-14 (×4): 1 mg via INTRAVENOUS
  Filled 2023-07-13: qty 2
  Filled 2023-07-13 (×4): qty 1

## 2023-07-13 MED ORDER — BUPIVACAINE-EPINEPHRINE 0.25% -1:200000 IJ SOLN
INTRAMUSCULAR | Status: DC | PRN
  Administered 2023-07-13: 20 mL

## 2023-07-13 MED ORDER — ONDANSETRON 4 MG PO TBDP: 4.0000 mg | ORAL_TABLET | Freq: Four times a day (QID) | ORAL | Status: DC | PRN

## 2023-07-13 MED ORDER — ENOXAPARIN SODIUM 40 MG/0.4ML IJ SOSY
40.0000 mg | PREFILLED_SYRINGE | INTRAMUSCULAR | Status: DC
  Filled 2023-07-13: qty 0.4

## 2023-07-13 MED ORDER — PROPOFOL 500 MG/50ML IV EMUL
INTRAVENOUS | Status: DC | PRN
Start: 2023-07-13 — End: 2023-07-13
  Administered 2023-07-13: 100 ug/kg/min via INTRAVENOUS

## 2023-07-13 MED ORDER — LIDOCAINE 2% (20 MG/ML) 5 ML SYRINGE
INTRAMUSCULAR | Status: DC | PRN
  Administered 2023-07-13: 60 mg via INTRAVENOUS

## 2023-07-13 SURGICAL SUPPLY — 63 items
ADH SKN CLS APL DERMABOND .7 (GAUZE/BANDAGES/DRESSINGS) ×1
APL PRP STRL LF DISP 70% ISPRP (MISCELLANEOUS) ×1
APPLIER CLIP 5 13 M/L LIGAMAX5 (MISCELLANEOUS)
APR CLP MED LRG 5 ANG JAW (MISCELLANEOUS)
CANNULA REDUCER 12-8 DVNC XI (CANNULA) ×1 IMPLANT
CHLORAPREP W/TINT 26 (MISCELLANEOUS) ×1 IMPLANT
CLIP APPLIE 5 13 M/L LIGAMAX5 (MISCELLANEOUS) IMPLANT
COVER MAYO STAND STRL (DRAPES) ×1 IMPLANT
COVER SURGICAL LIGHT HANDLE (MISCELLANEOUS) ×1 IMPLANT
COVER TIP SHEARS 8 DVNC (MISCELLANEOUS) IMPLANT
DEFOGGER SCOPE WARMER CLEARIFY (MISCELLANEOUS) ×1 IMPLANT
DERMABOND ADVANCED .7 DNX12 (GAUZE/BANDAGES/DRESSINGS) ×1 IMPLANT
DEVICE TROCAR PUNCTURE CLOSURE (ENDOMECHANICALS) ×1 IMPLANT
DRAIN PENROSE 0.5X18 (DRAIN) IMPLANT
DRAPE ARM DVNC X/XI (DISPOSABLE) ×4 IMPLANT
DRAPE CARDIOVASC SPLIT 88X140 (DRAPES) ×1 IMPLANT
DRAPE COLUMN DVNC XI (DISPOSABLE) ×1 IMPLANT
DRAPE ORTHO SPLIT 77X108 STRL (DRAPES) ×1
DRAPE SURG ORHT 6 SPLT 77X108 (DRAPES) ×1 IMPLANT
DRIVER NDL MEGA SUTCUT DVNCXI (INSTRUMENTS) ×1 IMPLANT
DRIVER NDLE MEGA SUTCUT DVNCXI (INSTRUMENTS) ×1 IMPLANT
ELECT REM PT RETURN 9FT ADLT (ELECTROSURGICAL) ×1
ELECTRODE REM PT RTRN 9FT ADLT (ELECTROSURGICAL) ×1 IMPLANT
FORCEPS BPLR FENES DVNC XI (FORCEP) ×1 IMPLANT
GLOVE BIO SURGEON STRL SZ7.5 (GLOVE) ×5 IMPLANT
GOWN STRL REUS W/ TWL LRG LVL3 (GOWN DISPOSABLE) ×1 IMPLANT
GOWN STRL REUS W/ TWL XL LVL3 (GOWN DISPOSABLE) ×2 IMPLANT
GOWN STRL REUS W/TWL 2XL LVL3 (GOWN DISPOSABLE) ×1 IMPLANT
GOWN STRL REUS W/TWL LRG LVL3 (GOWN DISPOSABLE) ×1
GOWN STRL REUS W/TWL XL LVL3 (GOWN DISPOSABLE) ×2
IRRIG SUCT STRYKERFLOW 2 WTIP (MISCELLANEOUS) ×1
IRRIGATION SUCT STRKRFLW 2 WTP (MISCELLANEOUS) ×1 IMPLANT
KIT BASIN OR (CUSTOM PROCEDURE TRAY) ×1 IMPLANT
KIT TURNOVER KIT B (KITS) IMPLANT
MARKER SKIN DUAL TIP RULER LAB (MISCELLANEOUS) ×1 IMPLANT
MESH BIO-A 7X10 SYN MAT (Mesh General) ×1 IMPLANT
NDL 22X1.5 STRL (OR ONLY) (MISCELLANEOUS) ×1 IMPLANT
NDL INSUFFLATION 14GA 120MM (NEEDLE) ×1 IMPLANT
NEEDLE 22X1.5 STRL (OR ONLY) (MISCELLANEOUS) ×1 IMPLANT
NEEDLE INSUFFLATION 14GA 120MM (NEEDLE) ×1 IMPLANT
OBTURATOR OPTICAL STND 8 DVNC (TROCAR)
OBTURATOR OPTICALSTD 8 DVNC (TROCAR) IMPLANT
PENCIL SMOKE EVACUATOR (MISCELLANEOUS) IMPLANT
RETRACTOR GRSP SML 8 DVNC XI (INSTRUMENTS) ×1 IMPLANT
SCISSORS LAP 5X35 DISP (ENDOMECHANICALS) IMPLANT
SCISSORS MNPLR CVD DVNC XI (INSTRUMENTS) IMPLANT
SEAL UNIV 5-12 XI (MISCELLANEOUS) ×4 IMPLANT
SEALER VESSEL EXT DVNC XI (MISCELLANEOUS) ×1 IMPLANT
SET TUBE SMOKE EVAC HIGH FLOW (TUBING) ×1 IMPLANT
SPIKE FLUID TRANSFER (MISCELLANEOUS) ×1 IMPLANT
STAPLER VISISTAT 35W (STAPLE) IMPLANT
STOPCOCK 4 WAY LG BORE MALE ST (IV SETS) ×1 IMPLANT
SUT ETHIBOND 0 36 GRN (SUTURE) ×2 IMPLANT
SUT ETHIBOND 2 0 SH (SUTURE)
SUT ETHIBOND 2 0 SH 36X2 (SUTURE) IMPLANT
SUT MNCRL AB 4-0 PS2 18 (SUTURE) ×1 IMPLANT
SUT SILK 0 SH 30 (SUTURE) ×1 IMPLANT
SUT VIC AB 4-0 PS2 18 (SUTURE) IMPLANT
SUT VICRYL 0 UR6 27IN ABS (SUTURE) ×1 IMPLANT
SYR 30ML SLIP (SYRINGE) ×1 IMPLANT
TRAY FOLEY MTR SLVR 16FR STAT (SET/KITS/TRAYS/PACK) ×1 IMPLANT
TRAY LAPAROSCOPIC MC (CUSTOM PROCEDURE TRAY) ×1 IMPLANT
TROCAR ADV FIXATION 5X100MM (TROCAR) ×1 IMPLANT

## 2023-07-13 NOTE — Anesthesia Procedure Notes (Signed)
Procedure Name: Intubation Date/Time: 07/13/2023 7:35 AM  Performed by: Darryl Nestle, CRNAPre-anesthesia Checklist: Patient identified, Emergency Drugs available, Suction available and Patient being monitored Patient Re-evaluated:Patient Re-evaluated prior to induction Oxygen Delivery Method: Circle system utilized Preoxygenation: Pre-oxygenation with 100% oxygen Induction Type: IV induction Ventilation: Mask ventilation without difficulty Laryngoscope Size: Mac and 4 Grade View: Grade I Tube type: Oral Tube size: 7.0 mm Number of attempts: 1 Airway Equipment and Method: Stylet and Oral airway Placement Confirmation: ETT inserted through vocal cords under direct vision, positive ETCO2 and breath sounds checked- equal and bilateral Secured at: 21 cm Tube secured with: Tape Dental Injury: Teeth and Oropharynx as per pre-operative assessment

## 2023-07-13 NOTE — H&P (Signed)
Chief Complaint: New Consultation (Large Hiatal hernia)       History of Present Illness: [name] is a 72 y.o. female who is seen today as an office consultation at the request of Dr. Gwendalyn Ege for evaluation of New Consultation (Large Hiatal hernia) .   Patient is a 72 year old female who comes in secondary to a hiatal hernia.  Patient has recently undergone a screening endoscopy and colonoscopy secondary to chronic anemia. Patient was found to have a large hiatal hernia.  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 Patient did undergo upper GI which revealed large hiatal hernia.   She does state that she has dysphagia as well as reflux at night.  She states that she sleeps on her side.   Upon reviewing her laboratory studies patient with hematocrit of 28.5 with an MCV of 75.   Patient states that she had no previous abdominal surgery.       Review of Systems: A complete review of systems was obtained from the patient.  I have reviewed this information and discussed as appropriate with the patient.  See HPI as well for other ROS.   Review of Systems  Constitutional:  Negative for fever.  HENT:  Negative for congestion.   Eyes:  Negative for blurred vision.  Respiratory:  Negative for cough, shortness of breath and wheezing.   Cardiovascular:  Negative for chest pain and palpitations.  Gastrointestinal:  Positive for heartburn.  Genitourinary:  Negative for dysuria.  Musculoskeletal:  Negative for myalgias.  Skin:  Negative for rash.  Neurological:  Negative for dizziness and headaches.  Psychiatric/Behavioral:  Negative for depression and suicidal ideas.   All other systems reviewed and are negative.       Medical History: Past Medical History Past Medical History: DiagnosisDate            Anemia                         Arthritis                         GERD (gastroesophageal reflux disease)                   Hyperlipidemia                       Problem List There is no problem list on file for this patient.     Past Surgical History Past Surgical History: ProcedureLateralityDate             Orif ankle fracture (Right)                               Alveoloplasty (Bilateral)                                   HYSTERECTOMY                               Mandible surgery                            Allergies Allergies AllergenReactions DonepezilOther (See Comments)  Other reaction(s): CHEST PAINS & THRUSH,PALPITATIONS   Other reaction(s): CHEST PAINS & THRUSH,PALPITATIONS    Pt denies EstrogensOther (See Comments)                         Other reaction(s): HRT-DVT   Other reaction(s): HRT-DVT  Patient un aware GabapentinNausea And Vomiting       Medications Ordered Prior to Encounter Current Outpatient Medications on File Prior to Visit MedicationSigDispenseRefill            aspirin 81 MG EC tablet         Take 81 mg by mouth every morning                          buPROPion (WELLBUTRIN XL) 300 MG XL tablet   TAKE 1 TABLET BY MOUTH IN THE MORNING FOR DEPRESSION/ANXIETY.                          citalopram (CELEXA) 40 MG tablet    TAKE 1 TABLET BY MOUTH IN THE EVENING FOR DEPRESSION/ANXIETY.                            gabapentin (NEURONTIN) 300 MG capsule Take 300 mg by mouth at bedtime                              galantamine (RAZADYNE) 12 MG tablet       Take 12 mg by mouth 2 (two) times daily                             omeprazole (PRILOSEC) 40 MG DR capsule           Take by mouth                                     oxyCODONE-acetaminophen (PERCOCET) 5-325 mg tablet          Take 1 tablet by mouth 4 (four) times daily as needed                                QUEtiapine (SEROQUEL) 300 MG tablet      TAKE 2 TABLETS BY MOUTH AT BEDTIME FOR MOOD/SLEEP                                  ramelteon (ROZEREM) 8 mg tablet   Take 8 mg by mouth at bedtime as needed for Sleep                                   simvastatin (ZOCOR) 80 MG tablet   Take 80 mg by mouth at bedtime                         No current facility-administered medications on file prior to visit.       Family History Family History ProblemRelationAge of Onset  Colon cancer   Brother                    Tobacco Use History Social History     Tobacco Use Smoking StatusNever Smokeless TobaccoNever       Social History Social History     Socioeconomic History Marital status:Single Tobacco Use Smoking status:Never Smokeless tobacco:Never Vaping Use Vaping status:Never Used Substance and Sexual Activity Alcohol use:Not Currently Drug WJX:[id number]       Objective:     Pulse 97   Temp 98.4 F (36.9 C) (Oral)   Resp 18   Ht 5\' 5"  (1.651 m)   Wt 96.6 kg   SpO2 98%   BMI 35.45 kg/m     Body mass index is 36.38 kg/m.   Physical Exam Constitutional:      Appearance: Normal appearance.  HENT:     Head: Normocephalic and atraumatic.     Mouth/Throat:     Mouth: Mucous membranes are moist.     Pharynx: Oropharynx is clear.  Eyes:     General: No scleral icterus.    Pupils: Pupils are equal, round, and reactive to light.  Cardiovascular:     Rate and Rhythm: Normal rate and regular rhythm.     Pulses: Normal pulses.     Heart sounds: No murmur heard.    No friction rub. No gallop.  Pulmonary:     Effort: Pulmonary effort is normal. No respiratory distress.     Breath sounds: Normal breath sounds. No stridor.  Abdominal:     General: Abdomen is flat.  Musculoskeletal:        General: No swelling.  Skin:    General: Skin is warm.  Neurological:     General: No focal deficit present.     Mental Status: She is alert and oriented to person, place, and time. Mental status is at baseline.  Psychiatric:        Mood and Affect: Mood normal.        Thought Content: Thought content normal.        Judgment: Judgment normal.        Assessment and Plan: Diagnoses  and all orders for this visit:   Hiatal hernia   Chronic [name]   Symptomatic cholelithiasis     [name] is a 72 y.o. female    1.          We will proceed to the OR for a robotic hiatal hernia repair with mesh and fundoplication 2.         All risks and benefits were discussed with the patient, to generally include infection, bleeding, damage to surrounding structures, possible pneumothorax, and recurrence. Alternatives were offered and described.  All questions were answered and the patient voiced understanding of the procedure and wishes to proceed at this point.

## 2023-07-13 NOTE — Anesthesia Preprocedure Evaluation (Addendum)
Anesthesia Evaluation  Patient identified by MRN, date of birth, ID band Patient awake    Reviewed: Allergy & Precautions, NPO status , Patient's Chart, lab work & pertinent test results  Airway Mallampati: III       Dental  (+) Edentulous Upper, Edentulous Lower   Pulmonary neg pulmonary ROS   Pulmonary exam normal        Cardiovascular negative cardio ROS Normal cardiovascular exam     Neuro/Psych  Headaches PSYCHIATRIC DISORDERS  Depression  Schizophrenia Dementia  Neuromuscular disease    GI/Hepatic Neg liver ROS, hiatal hernia,GERD  Medicated and Controlled,,  Endo/Other  negative endocrine ROS    Renal/GU Renal InsufficiencyRenal disease     Musculoskeletal  (+) Arthritis ,    Abdominal  (+) + obese  Peds  Hematology negative hematology ROS (+)   Anesthesia Other Findings camerons ulcer  Reproductive/Obstetrics                             Anesthesia Physical Anesthesia Plan  ASA: 3  Anesthesia Plan: General   Post-op Pain Management:    Induction: Intravenous  PONV Risk Score and Plan: 3 and Ondansetron, Dexamethasone, Treatment may vary due to age or medical condition and Propofol infusion  Airway Management Planned: Oral ETT  Additional Equipment:   Intra-op Plan:   Post-operative Plan: Extubation in OR  Informed Consent: I have reviewed the patients History and Physical, chart, labs and discussed the procedure including the risks, benefits and alternatives for the proposed anesthesia with the patient or authorized representative who has indicated his/her understanding and acceptance.     Dental advisory given  Plan Discussed with: CRNA  Anesthesia Plan Comments:         Anesthesia Quick Evaluation

## 2023-07-13 NOTE — Op Note (Signed)
07/13/2023  9:18 AM  PATIENT:  [name]  72 y.o. female  PRE-OPERATIVE DIAGNOSIS:  hiatal hernia, cameron's ulcers   POST-OPERATIVE DIAGNOSIS:  hiatal hernia, camerons ulcer  PROCEDURE:  Procedure(s): XI ROBOTIC ASSISTED HIATAL HERNIA REPAIR WITH MESH AND TOUPET FUNDOPLICATION (N/A)  SURGEON:  Surgeons and Role:    Axel Filler, MD - Primary  ASSISTANTS: Rockwell Germany, RNFA   ANESTHESIA:   local, regional, and general  EBL:  minimal   BLOOD ADMINISTERED:none  DRAINS: none   LOCAL MEDICATIONS USED:  BUPIVICAINE   SPECIMEN:  No Specimen  DISPOSITION OF SPECIMEN:  N/A  COUNTS:  YES  TOURNIQUET:  * No tourniquets in log *  DICTATION: .Dragon Dictation  Findings:  Large hiatal hernia approximately 3-4cm.  75% of the stomach was within the thorax.  The patient was taken back to the operating room and placed in the supine position with bilateral SCDs in place. The patient was prepped and draped in the usual sterile fashion. After appropriate antibiotics were confirmed a timeout was called and all facts were verified.   A Veress needle technique was used to insufflate the abdomen to 15 mm of mercury the paramedian stab incision. Subsequent to this an 8 mm trocar was introduced as was a 8 millimeter camera. At this time the subsequent robotic trochars x3, were then placed adjacent to this trocar approximately 8-10 cm away. Each trocar was inserted under direct visualization, there were total of 4 trochars. A 12mm trocar was placed in the midclavicular line.  A 0vicryl was placed to help with closure at the end of the case.The assistant trocar was then placed in the right lower quadrant under direct visualization. The Nathanson retractor was then visualized inserted into the abdomen and the incision just to the left of the falciform ligament. This was then placed to retract the liver appropriately. At this time the patient was positioned in reverse Trendelenburg.   At  this time the robot patient cart was brought to the bedside and placed in good position and the arms were docked to the trochars appropriately. At this time I proceeded to incised the gastrohepatic ligament.  At this time I proceeded to mobilize the stomach inferiorly and visualize the right crus. The peritoneum over the right crus was incised and right crus was identified. I proceeded to dissect this inferiorly until the left crus was seen joining the right crus. Once the right crus was adequately dissected we turned our to the left crus which was dissected away. This required traction of the stomach to the right side. Once this was visualized we then proceeded to circumferentially dissect the esophagus away from the surrounding tissue. The anterior and posterior vagus was seen along the esophagus at the GE junction.  These were both preserved throughout the entire case. At this time the phrenoesophageal fat pad was dissected away from the esophagus. There was a moderate-sized hiatal hernia seen. I mobilized the esophagus cephalad approximately 4-5 cm, clearing away the surrounding tissue. The anterior hernia sac was dissected away from the stomach and esophagus.  At this time we turned our attention to the greater curvature the stomach and the omentum was mobilized using the robotic vessel sealer. This was taken up to the greater curvature to the hiatus. This mobilized the entire greater curvature to allow mobilization and the wrap. I then proceeded to bring the greater curvature the stomach posterior to the esophagus, and a shoeshine technique was used to evaluate the mobilization of  the greater curvature.   The hernia sac was dissected away from the stomach and discarded.  At this time I proceeded to close the hiatus using interrupted 0 Ethibonds x 3. This brought together the hiatal closure without undue stricture to the esophagus.   A piece of Gore Bio A hiatal mesh was placed over the hiatal closure  and sutured to the crus using 0 Ethibonds sutures x 3.  At this time the greater curvature was brought around the esophagus and sutured using 0 silk sutures interrupted fashion approximately 1 cm apart x3 on each side of the esophagus in a Toupet fashion. A left collar stitch was then used to gastropexy the stomach from the wrap to the diaphragm just lateral to the left crus as.  A second collar stitch was placed from the wrap to the right crus.  The wrap lay loose with no strangulation of the esophagus.  At this time the robot was undocked. The liver trocar was removed. At this time insufflation was evacuated. Skin was reapproximated for Monocryl subcuticular fashion. The skin was then dressed with Dermabond. The patient tolerated the procedure well and was taken to the recovery room in stable condition.   PLAN OF CARE: Admit for overnight observation  PATIENT DISPOSITION:  PACU - hemodynamically stable.   Delay start of Pharmacological VTE agent (>24hrs) due to surgical blood loss or risk of bleeding: not applicable

## 2023-07-13 NOTE — Transfer of Care (Signed)
Immediate Anesthesia Transfer of Care Note  Patient: [name]  Procedure(s) Performed: XI ROBOTIC ASSISTED HIATAL HERNIA REPAIR WITH MESH AND FUNDOPLICATION  Patient Location: PACU  Anesthesia Type:General  Level of Consciousness: drowsy  Airway & Oxygen Therapy: Patient Spontanous Breathing and Patient connected to face mask oxygen  Post-op Assessment: Report given to RN and Post -op Vital signs reviewed and stable  Post vital signs: Reviewed and stable  Last Vitals:  Vitals Value Taken Time  BP 150/93 07/13/23 0940  Temp 36.3 C 07/13/23 0940  Pulse 60 07/13/23 0942  Resp 9 07/13/23 0942  SpO2 97 % 07/13/23 0942  Vitals shown include unfiled device data.  Last Pain:  Vitals:   07/13/23 0940  TempSrc:   PainSc: Asleep         Complications: No notable events documented.

## 2023-07-13 NOTE — Plan of Care (Signed)

## 2023-07-14 ENCOUNTER — Observation Stay (HOSPITAL_COMMUNITY): Payer: 59

## 2023-07-14 ENCOUNTER — Encounter (HOSPITAL_COMMUNITY): Payer: Self-pay | Admitting: General Surgery

## 2023-07-14 DIAGNOSIS — K449 Diaphragmatic hernia without obstruction or gangrene: Secondary | ICD-10-CM | POA: Diagnosis not present

## 2023-07-14 LAB — CBC
HCT: 40.6 % (ref 36.0–46.0)
Hemoglobin: 12.9 g/dL (ref 12.0–15.0)
MCH: 28.8 pg (ref 26.0–34.0)
MCHC: 31.8 g/dL (ref 30.0–36.0)
MCV: 90.6 fL (ref 80.0–100.0)
Platelets: 196 10*3/uL (ref 150–400)
RBC: 4.48 MIL/uL (ref 3.87–5.11)
RDW: 21.4 % — ABNORMAL HIGH (ref 11.5–15.5)
WBC: 9.5 10*3/uL (ref 4.0–10.5)
nRBC: 0 % (ref 0.0–0.2)

## 2023-07-14 MED ORDER — OXYCODONE-ACETAMINOPHEN 5-325 MG PO TABS
1.0000 | ORAL_TABLET | ORAL | Status: DC | PRN
  Administered 2023-07-14 – 2023-07-15 (×5): 2 via ORAL
  Filled 2023-07-14 (×5): qty 2

## 2023-07-14 MED ORDER — IOHEXOL 300 MG/ML  SOLN
100.0000 mL | Freq: Once | INTRAMUSCULAR | Status: AC | PRN
  Administered 2023-07-14: 50 mL via ORAL

## 2023-07-14 MED ORDER — HYDROMORPHONE HCL 1 MG/ML IJ SOLN: 1.0000 mg | INTRAMUSCULAR | Status: DC | PRN

## 2023-07-14 MED ORDER — ENSURE ENLIVE PO LIQD: 237.0000 mL | Freq: Two times a day (BID) | ORAL | Status: DC

## 2023-07-14 MED ORDER — HYDROCODONE-ACETAMINOPHEN 7.5-325 MG/15ML PO SOLN: 10.0000 mL | ORAL | Status: DC | PRN

## 2023-07-14 NOTE — Anesthesia Postprocedure Evaluation (Signed)
Anesthesia Post Note  Patient: [name]  Procedure(s) Performed: XI ROBOTIC ASSISTED HIATAL HERNIA REPAIR WITH MESH AND FUNDOPLICATION     Patient location during evaluation: PACU Anesthesia Type: General Level of consciousness: awake Pain management: pain level controlled Vital Signs Assessment: post-procedure vital signs reviewed and stable Respiratory status: spontaneous breathing, nonlabored ventilation and respiratory function stable Cardiovascular status: blood pressure returned to baseline and stable Postop Assessment: no apparent nausea or vomiting Anesthetic complications: no   No notable events documented.  Last Vitals:  Vitals:   07/13/23 2345 07/14/23 0522  BP: (!) 106/59 (!) 115/53  Pulse: 69 66  Resp: 17 18  Temp: 36.7 C 36.9 C  SpO2: 95% 97%    Last Pain:  Vitals:   07/14/23 0522  TempSrc: Oral  PainSc:                  Catheryn Bacon Jahmeer Porche

## 2023-07-14 NOTE — Progress Notes (Signed)
Mobility Specialist: Progress Note   07/14/23 1707  Mobility  Activity Ambulated independently in room  Level of Assistance Independent  Assistive Device None  Distance Ambulated (ft) 40 ft  Activity Response Tolerated well  Mobility Referral Yes  $Mobility charge 1 Mobility  Mobility Specialist Start Time (ACUTE ONLY) 1626  Mobility Specialist Stop Time (ACUTE ONLY) 1631  Mobility Specialist Time Calculation (min) (ACUTE ONLY) 5 min    Received pt in bed having no complaints and agreeable to mobility. Pt was asymptomatic throughout ambulation and returned to room w/o fault. Left in bed w/ call bell in reach and all needs met.   Maurene Capes Mobility Specialist Please contact via SecureChat or Rehab office at [phone]

## 2023-07-14 NOTE — Care Management Obs Status (Signed)
MEDICARE OBSERVATION STATUS NOTIFICATION   Patient Details  Name: [name] MRN: [id number] Date of Birth: 1950-11-29   Medicare Observation Status Notification Given:  Yes    Lawerance Sabal, RN 07/14/2023, 8:54 AM

## 2023-07-14 NOTE — Progress Notes (Signed)
Initial Nutrition Assessment  DOCUMENTATION CODES:   Obesity unspecified  INTERVENTION:  Ensure Enlive po BID, each supplement provides 350 kcal and 20 grams of protein. Pureed diet education s/p fundoplication provided  NUTRITION DIAGNOSIS:   Increased nutrient needs related to post-op healing as evidenced by estimated needs.  GOAL:   Patient will meet greater than or equal to 90% of their needs  MONITOR:   PO intake, Supplement acceptance, Diet advancement, Labs, Weight trends  REASON FOR ASSESSMENT:   Consult Diet education (pureed diet x2 weeks)  ASSESSMENT:   Pt admitted for robotic assisted hiatal hernia repair with mesh and toupet fundoplication. PMH significant for dementia, GERD, chronic pain disorder, hiatal hernia.  Spoke with pt at bedside. She reports that she lives alone. She typically eats 2 meals per day however over the last 4 months, d/t increase in abdominal pain/discomfort, she has only been eating 1 meal per day. This meal would include a hamburger. She endorses having early satiety. Her son Paramedic for pt to consume at home.   Pt has not consumed anything except coffee today and endorses ongoing discomfort and pain. MD advanced diet order to dysphagia 1/puree. Lunch order placed for pt.   Pt states that her weight has remained stable despite not eating as well over the last several months. Unfortunately, there is limited documentation of weight history on file to review over the last year. She is noted to have had a weight loss of 1.8% over the last 2 months which is not clinically significant for time frame.   Post-op diet education provided. Encouraged pt to eat, smaller, more frequent pureed meals x2 weeks. Discussed ways to increase protein intake. Coupons provided for Ensure. Discussed importance of sitting up for about 1 hour following intake to ensure adequate digestion.    Medications and labs: reviewed  NUTRITION -  FOCUSED PHYSICAL EXAM:  Flowsheet Row Most Recent Value  Orbital Region No depletion  Upper Arm Region No depletion  Thoracic and Lumbar Region No depletion  Buccal Region No depletion  Temple Region No depletion  Clavicle Bone Region No depletion  Clavicle and Acromion Bone Region No depletion  Scapular Bone Region No depletion  Dorsal Hand No depletion  Patellar Region No depletion  Anterior Thigh Region No depletion  Posterior Calf Region No depletion  Edema (RD Assessment) None  Hair Reviewed  Eyes Reviewed  Mouth Other (Comment)  [edentulous]  Skin Reviewed  Nails Reviewed       Diet Order:   Diet Order             DIET - DYS 1 Room service appropriate? Yes; Fluid consistency: Thin  Diet effective now                   EDUCATION NEEDS:   Education needs have been addressed  Skin:  Skin Assessment: Reviewed RN Assessment (closed abominal incision)  Last BM:  9/3  Height:   Ht Readings from Last 1 Encounters:  07/13/23 5\' 5"  (1.651 m)    Weight:   Wt Readings from Last 1 Encounters:  07/13/23 96.6 kg    Ideal Body Weight:  56.8 kg  BMI:  Body mass index is 35.45 kg/m.  Estimated Nutritional Needs:   Kcal:  1500-1700  Protein:  75-90g  Fluid:  >/=1.5L  [name], RDN, LDN Clinical Nutrition

## 2023-07-14 NOTE — Progress Notes (Signed)
1 Day Post-Op   Subjective/Chief Complaint: Doing well this AM  Some abd soreness   Objective: Vital signs in last 24 hours: Temp:  [97.3 F (36.3 C)-98.5 F (36.9 C)] 98.4 F (36.9 C) (09/04 0522) Pulse Rate:  [59-81] 66 (09/04 0522) Resp:  [10-20] 18 (09/04 0522) BP: (106-183)/(53-106) 115/53 (09/04 0522) SpO2:  [90 %-97 %] 97 % (09/04 0522) Last BM Date : 07/13/23  Intake/Output from previous day: 09/03 0701 - 09/04 0700 In: 1134.4 [I.V.:1134.4] Out: -  Intake/Output this shift: No intake/output data recorded.  General appearance: alert and cooperative GI: soft, non-tender; bowel sounds normal; no masses,  no organomegaly and inc c/d/i  Lab Results:  No results for input(s): "WBC", "HGB", "HCT", "PLT" in the last 72 hours. BMET No results for input(s): "NA", "K", "CL", "CO2", "GLUCOSE", "BUN", "CREATININE", "CALCIUM" in the last 72 hours. PT/INR No results for input(s): "LABPROT", "INR" in the last 72 hours. ABG No results for input(s): "PHART", "HCO3" in the last 72 hours.  Invalid input(s): "PCO2", "PO2"  Studies/Results: No results found.  Anti-infectives: Anti-infectives (From admission, onward)    Start     Dose/Rate Route Frequency Ordered Stop   07/13/23 0600  ceFAZolin (ANCEF) IVPB 2g/100 mL premix        2 g 200 mL/hr over 30 Minutes Intravenous On call to O.R. 07/13/23 0549 07/13/23 0751   07/13/23 0552  vancomycin (VANCOCIN) IVPB 1000 mg/200 mL premix        1,000 mg 200 mL/hr over 60 Minutes Intravenous 60 min pre-op 07/13/23 0552 07/13/23 1227       Assessment/Plan: s/p Procedure(s): XI ROBOTIC ASSISTED HIATAL HERNIA REPAIR WITH MESH AND FUNDOPLICATION (N/A) -F/u Esophagram and if OK, OK to start pureed diet -home later today if feeling well.  Will DC on pureed diet  LOS: 0 days    [name] 07/14/2023

## 2023-07-14 NOTE — Discharge Instructions (Signed)

## 2023-07-15 DIAGNOSIS — K449 Diaphragmatic hernia without obstruction or gangrene: Secondary | ICD-10-CM | POA: Diagnosis not present

## 2023-07-15 MED ORDER — HYDROCODONE-ACETAMINOPHEN 7.5-325 MG/15ML PO SOLN
15.0000 mL | Freq: Four times a day (QID) | ORAL | 0 refills | Status: AC | PRN
Start: 2023-07-15 — End: 2024-07-14

## 2023-07-15 NOTE — Plan of Care (Signed)

## 2023-07-15 NOTE — Plan of Care (Signed)
  Problem: Education: Goal: Knowledge of General Education information will improve Description: Including pain rating scale, medication(s)/side effects and non-pharmacologic comfort measures Outcome: Adequate for Discharge   Problem: Health Behavior/Discharge Planning: Goal: Ability to manage health-related needs will improve Outcome: Adequate for Discharge   Problem: Clinical Measurements: Goal: Ability to maintain clinical measurements within normal limits will improve Outcome: Adequate for Discharge Goal: Will remain free from infection Outcome: Adequate for Discharge Goal: Diagnostic test results will improve Outcome: Adequate for Discharge Goal: Respiratory complications will improve Outcome: Adequate for Discharge Goal: Cardiovascular complication will be avoided Outcome: Adequate for Discharge   Problem: Activity: Goal: Risk for activity intolerance will decrease Outcome: Adequate for Discharge   Problem: Nutrition: Goal: Adequate nutrition will be maintained Outcome: Adequate for Discharge   Problem: Coping: Goal: Level of anxiety will decrease Outcome: Adequate for Discharge   Problem: Elimination: Goal: Will not experience complications related to bowel motility Outcome: Adequate for Discharge Goal: Will not experience complications related to urinary retention Outcome: Adequate for Discharge   Problem: Pain Managment: Goal: General experience of comfort will improve Outcome: Adequate for Discharge   Problem: Safety: Goal: Ability to remain free from injury will improve Outcome: Adequate for Discharge   Problem: Skin Integrity: Goal: Risk for impaired skin integrity will decrease Outcome: Adequate for Discharge    Tresean Mattix Tamera Stands, RN

## 2023-07-15 NOTE — Discharge Summary (Signed)
Physician Discharge Summary  Patient ID: [name] MRN: [id number] DOB/AGE: [birth year] 72 y.o.  Admit date: 07/13/2023 Discharge date: 07/15/2023  Admission Diagnoses: Hiatal hernia  Discharge Diagnoses:  Principal Problem:   S/P repair of paraesophageal hernia   Discharged Condition: good  Hospital Course: Patient was admitted postoperatively.  Patient underwent robotic hiatal hernia repair toupet fundoplication.  Patient did well postoperatively.  She had an esophagram postop day 1.  This did show no leak.  She was advanced to a clear liquid diet to pured diet.  She had consultation with dietitian.  Patient was otherwise ambulating well on her own.  Was having good urinary output.  She was deemed stable for discharge and discharged home.  Consults:  None  Significant Diagnostic Studies: Esophagram without leak  Treatments: surgery: As above  Discharge Exam: Blood pressure (!) 126/95, pulse 86, temperature 98.7 F (37.1 C), temperature source Oral, resp. rate 20, height 5\' 5"  (1.651 m), weight 96.6 kg, SpO2 98%. General appearance: alert and cooperative GI: soft, non-tender; bowel sounds normal; no masses,  no organomegaly and incisions are clean dry and intact.  Disposition: Discharge disposition: 01-Home or Self Care       Discharge Instructions     Diet - low sodium heart healthy   Complete by: As directed    Increase activity slowly   Complete by: As directed       Allergies as of 07/15/2023       Reactions   Donepezil Other (See Comments)   Other reaction(s): CHEST PAINS & THRUSH,PALPITATIONS Pt denies   Estrogens Other (See Comments)   Other reaction(s): HRT-DVT Patient un aware        Medication List     TAKE these medications    aspirin EC 81 MG tablet Take 81 mg by mouth every morning.   BIOTIN PO Take 1 tablet by mouth every morning.   buPROPion 300 MG 24 hr tablet Commonly known as: WELLBUTRIN XL Take 300 mg by mouth at bedtime.    citalopram 40 MG tablet Commonly known as: CELEXA Take 40 mg by mouth daily.   fish oil-omega-3 fatty acids 1000 MG capsule Take 1,000 mg by mouth daily.   gabapentin 300 MG capsule Commonly known as: NEURONTIN Take 300 mg by mouth at bedtime.   galantamine 12 MG tablet Commonly known as: RAZADYNE Take 12 mg by mouth 2 (two) times daily.   HYDROcodone-acetaminophen 7.5-325 mg/15 ml solution Commonly known as: HYCET Take 15 mLs by mouth 4 (four) times daily as needed for moderate pain.   hydrOXYzine 50 MG capsule Commonly known as: VISTARIL Take 50 mg by mouth 3 (three) times daily as needed for anxiety.   ibuprofen 200 MG tablet Commonly known as: ADVIL Take 400 mg by mouth every 6 (six) hours as needed for moderate pain.   omeprazole 40 MG capsule Commonly known as: PRILOSEC Take 40 mg by mouth in the morning and at bedtime.   oxybutynin 5 MG tablet Commonly known as: DITROPAN Take 5 mg by mouth 2 (two) times daily as needed for bladder spasms.   propranolol 10 MG tablet Commonly known as: INDERAL Take 10 mg by mouth 2 (two) times daily as needed (Anxiety).   QUEtiapine 300 MG 24 hr tablet Commonly known as: SEROQUEL XR Take 600 mg by mouth at bedtime.   ramelteon 8 MG tablet Commonly known as: ROZEREM Take 8 mg by mouth at bedtime.   simvastatin 80 MG tablet Commonly known as: ZOCOR Take  80 mg by mouth at bedtime.   tiZANidine 4 MG tablet Commonly known as: ZANAFLEX Take 4 mg by mouth 3 (three) times daily as needed for muscle spasms.   Vitamin D3 50 MCG (2000 UT) Tabs Take 2,000 Units by mouth daily.        Follow-up Information     Axel Filler, MD. Schedule an appointment as soon as possible for a visit in 2 week(s).   Specialty: General Surgery Why: Post op visit Contact information: [address] [place] [address] [place] [address] [phone]                 Signed: Axel Filler 07/15/2023, 7:42 AM

## 2023-10-19 ENCOUNTER — Ambulatory Visit: Payer: Self-pay | Admitting: General Surgery

## 2024-07-29 ENCOUNTER — Ambulatory Visit (HOSPITAL_COMMUNITY): Admission: EM | Admit: 2024-07-29 | Discharge: 2024-07-29 | Disposition: A | Discharge status: Home / Self Care

## 2024-07-30 ENCOUNTER — Ambulatory Visit (HOSPITAL_COMMUNITY)
